# Patient Record
Sex: Female | Born: 1950
Health system: Southern US, Community
[De-identification: ages and names within clinical notes are randomized; demographics above are authoritative.]

## PROBLEM LIST (undated history)

## (undated) DIAGNOSIS — K219 Gastro-esophageal reflux disease without esophagitis: Secondary | ICD-10-CM

## (undated) DIAGNOSIS — I10 Essential (primary) hypertension: Secondary | ICD-10-CM

## (undated) DIAGNOSIS — R079 Chest pain, unspecified: Secondary | ICD-10-CM

## (undated) DIAGNOSIS — M1990 Unspecified osteoarthritis, unspecified site: Secondary | ICD-10-CM

## (undated) DIAGNOSIS — J4540 Moderate persistent asthma, uncomplicated: Principal | ICD-10-CM

## (undated) DIAGNOSIS — IMO0001 Reserved for inherently not codable concepts without codable children: Secondary | ICD-10-CM

## (undated) DIAGNOSIS — D649 Anemia, unspecified: Secondary | ICD-10-CM

## (undated) DIAGNOSIS — K529 Noninfective gastroenteritis and colitis, unspecified: Secondary | ICD-10-CM

## (undated) DIAGNOSIS — T783XXA Angioneurotic edema, initial encounter: Secondary | ICD-10-CM

## (undated) DIAGNOSIS — G4730 Sleep apnea, unspecified: Secondary | ICD-10-CM

## (undated) DIAGNOSIS — E7800 Pure hypercholesterolemia, unspecified: Secondary | ICD-10-CM

## (undated) HISTORY — DX: Moderate persistent asthma, uncomplicated: J45.40

## (undated) HISTORY — DX: Angioneurotic edema, initial encounter: T78.3XXA

## (undated) HISTORY — DX: Noninfective gastroenteritis and colitis, unspecified: K52.9

## (undated) HISTORY — PX: TONSILLECTOMY: SUR1361

---

## 1981-07-06 HISTORY — PX: TUBAL LIGATION: SHX77

## 1981-07-06 HISTORY — PX: ABDOMINAL HYSTERECTOMY: SHX81

## 1988-07-06 HISTORY — PX: INCONTINENCE SURGERY: SHX676

## 1998-07-06 DIAGNOSIS — K219 Gastro-esophageal reflux disease without esophagitis: Secondary | ICD-10-CM

## 1998-07-06 HISTORY — DX: Gastro-esophageal reflux disease without esophagitis: K21.9

## 2000-11-14 ENCOUNTER — Emergency Department (HOSPITAL_COMMUNITY): Admission: EM | Admit: 2000-11-14 | Discharge: 2000-11-14 | Payer: Self-pay | Admitting: Emergency Medicine

## 2000-11-14 ENCOUNTER — Encounter: Payer: Self-pay | Admitting: *Deleted

## 2001-03-07 ENCOUNTER — Emergency Department (HOSPITAL_COMMUNITY): Admission: EM | Admit: 2001-03-07 | Discharge: 2001-03-08 | Payer: Self-pay | Admitting: *Deleted

## 2001-03-08 ENCOUNTER — Encounter: Payer: Self-pay | Admitting: *Deleted

## 2001-07-21 ENCOUNTER — Encounter: Payer: Self-pay | Admitting: Unknown Physician Specialty

## 2001-07-21 ENCOUNTER — Ambulatory Visit (HOSPITAL_COMMUNITY): Admission: RE | Admit: 2001-07-21 | Discharge: 2001-07-21 | Payer: Self-pay | Admitting: Unknown Physician Specialty

## 2001-10-06 ENCOUNTER — Encounter: Payer: Self-pay | Admitting: Emergency Medicine

## 2001-10-06 ENCOUNTER — Emergency Department (HOSPITAL_COMMUNITY): Admission: EM | Admit: 2001-10-06 | Discharge: 2001-10-06 | Payer: Self-pay | Admitting: *Deleted

## 2001-10-17 ENCOUNTER — Encounter: Payer: Self-pay | Admitting: Internal Medicine

## 2001-10-17 ENCOUNTER — Ambulatory Visit (HOSPITAL_COMMUNITY): Admission: RE | Admit: 2001-10-17 | Discharge: 2001-10-17 | Payer: Self-pay | Admitting: Internal Medicine

## 2001-12-03 ENCOUNTER — Emergency Department (HOSPITAL_COMMUNITY): Admission: EM | Admit: 2001-12-03 | Discharge: 2001-12-03 | Payer: Self-pay | Admitting: Emergency Medicine

## 2003-08-20 ENCOUNTER — Ambulatory Visit (HOSPITAL_COMMUNITY): Admission: RE | Admit: 2003-08-20 | Discharge: 2003-08-20 | Payer: Self-pay | Admitting: Internal Medicine

## 2007-03-18 ENCOUNTER — Ambulatory Visit (HOSPITAL_COMMUNITY): Admission: RE | Admit: 2007-03-18 | Discharge: 2007-03-18 | Payer: Self-pay | Admitting: Family Medicine

## 2009-01-06 ENCOUNTER — Emergency Department (HOSPITAL_COMMUNITY): Admission: EM | Admit: 2009-01-06 | Discharge: 2009-01-06 | Payer: Self-pay | Admitting: Emergency Medicine

## 2009-07-06 DIAGNOSIS — E7800 Pure hypercholesterolemia, unspecified: Secondary | ICD-10-CM

## 2009-07-06 HISTORY — DX: Pure hypercholesterolemia, unspecified: E78.00

## 2010-08-02 ENCOUNTER — Emergency Department (HOSPITAL_COMMUNITY)
Admission: EM | Admit: 2010-08-02 | Discharge: 2010-08-02 | Payer: Self-pay | Source: Home / Self Care | Admitting: Emergency Medicine

## 2010-10-21 ENCOUNTER — Other Ambulatory Visit (HOSPITAL_COMMUNITY): Payer: Self-pay | Admitting: Family Medicine

## 2010-10-21 DIAGNOSIS — Z139 Encounter for screening, unspecified: Secondary | ICD-10-CM

## 2010-10-24 ENCOUNTER — Ambulatory Visit (HOSPITAL_COMMUNITY)
Admission: RE | Admit: 2010-10-24 | Discharge: 2010-10-24 | Disposition: A | Discharge status: Home / Self Care | Payer: PRIVATE HEALTH INSURANCE | Source: Ambulatory Visit | Attending: Family Medicine | Admitting: Family Medicine

## 2010-10-24 DIAGNOSIS — Z139 Encounter for screening, unspecified: Secondary | ICD-10-CM

## 2010-10-24 DIAGNOSIS — Z1231 Encounter for screening mammogram for malignant neoplasm of breast: Secondary | ICD-10-CM | POA: Insufficient documentation

## 2011-04-23 ENCOUNTER — Emergency Department (HOSPITAL_COMMUNITY): Payer: Self-pay

## 2011-04-23 ENCOUNTER — Other Ambulatory Visit: Payer: Self-pay

## 2011-04-23 ENCOUNTER — Inpatient Hospital Stay (HOSPITAL_COMMUNITY)
Admission: EM | Admit: 2011-04-23 | Discharge: 2011-04-25 | DRG: 392 | Disposition: A | Discharge status: Home / Self Care | Payer: Self-pay | Attending: Internal Medicine | Admitting: Internal Medicine

## 2011-04-23 ENCOUNTER — Encounter: Payer: Self-pay | Admitting: *Deleted

## 2011-04-23 DIAGNOSIS — K529 Noninfective gastroenteritis and colitis, unspecified: Secondary | ICD-10-CM | POA: Diagnosis present

## 2011-04-23 DIAGNOSIS — R7303 Prediabetes: Secondary | ICD-10-CM | POA: Diagnosis present

## 2011-04-23 DIAGNOSIS — E669 Obesity, unspecified: Secondary | ICD-10-CM | POA: Diagnosis present

## 2011-04-23 DIAGNOSIS — Z23 Encounter for immunization: Secondary | ICD-10-CM

## 2011-04-23 DIAGNOSIS — E119 Type 2 diabetes mellitus without complications: Secondary | ICD-10-CM | POA: Diagnosis present

## 2011-04-23 DIAGNOSIS — K5289 Other specified noninfective gastroenteritis and colitis: Principal | ICD-10-CM | POA: Diagnosis present

## 2011-04-23 DIAGNOSIS — I10 Essential (primary) hypertension: Secondary | ICD-10-CM | POA: Diagnosis present

## 2011-04-23 DIAGNOSIS — E7800 Pure hypercholesterolemia, unspecified: Secondary | ICD-10-CM | POA: Diagnosis present

## 2011-04-23 HISTORY — DX: Gastro-esophageal reflux disease without esophagitis: K21.9

## 2011-04-23 HISTORY — DX: Noninfective gastroenteritis and colitis, unspecified: K52.9

## 2011-04-23 HISTORY — DX: Essential (primary) hypertension: I10

## 2011-04-23 HISTORY — DX: Pure hypercholesterolemia, unspecified: E78.00

## 2011-04-23 LAB — COMPREHENSIVE METABOLIC PANEL
AST: 13 U/L (ref 0–37)
Albumin: 4 g/dL (ref 3.5–5.2)
BUN: 12 mg/dL (ref 6–23)
Calcium: 9.3 mg/dL (ref 8.4–10.5)
Creatinine, Ser: 0.89 mg/dL (ref 0.50–1.10)
Total Bilirubin: 0.4 mg/dL (ref 0.3–1.2)

## 2011-04-23 LAB — HEMOGLOBIN A1C: Hgb A1c MFr Bld: 6.2 % — ABNORMAL HIGH (ref <5.7)

## 2011-04-23 LAB — DIFFERENTIAL
Basophils Absolute: 0 10*3/uL (ref 0.0–0.1)
Basophils Relative: 0 % (ref 0–1)
Eosinophils Absolute: 0 10*3/uL (ref 0.0–0.7)
Eosinophils Relative: 0 % (ref 0–5)
Monocytes Absolute: 0.4 10*3/uL (ref 0.1–1.0)
Monocytes Relative: 3 % (ref 3–12)

## 2011-04-23 LAB — CBC
HCT: 39 % (ref 36.0–46.0)
Hemoglobin: 12.8 g/dL (ref 12.0–15.0)
MCH: 26.9 pg (ref 26.0–34.0)
MCHC: 32.8 g/dL (ref 30.0–36.0)
MCV: 82.1 fL (ref 78.0–100.0)
RDW: 14.1 % (ref 11.5–15.5)

## 2011-04-23 LAB — GLUCOSE, CAPILLARY: Glucose-Capillary: 79 mg/dL (ref 70–99)

## 2011-04-23 LAB — LIPASE, BLOOD: Lipase: 15 U/L (ref 11–59)

## 2011-04-23 MED ORDER — SIMVASTATIN 10 MG PO TABS
10.0000 mg | ORAL_TABLET | Freq: Every day | ORAL | Status: DC
  Administered 2011-04-23 – 2011-04-24 (×2): 10 mg via ORAL
  Filled 2011-04-23 (×2): qty 1

## 2011-04-23 MED ORDER — HYDROCHLOROTHIAZIDE 25 MG PO TABS
12.5000 mg | ORAL_TABLET | Freq: Every day | ORAL | Status: DC
  Administered 2011-04-23 – 2011-04-25 (×3): 12.5 mg via ORAL
  Filled 2011-04-23 (×3): qty 1

## 2011-04-23 MED ORDER — LISINOPRIL-HYDROCHLOROTHIAZIDE 20-12.5 MG PO TABS: 1.0000 | ORAL_TABLET | Freq: Every day | ORAL | Status: DC

## 2011-04-23 MED ORDER — ACETAMINOPHEN 325 MG PO TABS
650.0000 mg | ORAL_TABLET | Freq: Four times a day (QID) | ORAL | Status: DC | PRN
  Administered 2011-04-24 – 2011-04-25 (×2): 650 mg via ORAL
  Filled 2011-04-23 (×2): qty 2

## 2011-04-23 MED ORDER — CIPROFLOXACIN IN D5W 400 MG/200ML IV SOLN
400.0000 mg | Freq: Two times a day (BID) | INTRAVENOUS | Status: DC
  Administered 2011-04-23 – 2011-04-24 (×3): 400 mg via INTRAVENOUS
  Filled 2011-04-23 (×10): qty 200

## 2011-04-23 MED ORDER — IOHEXOL 300 MG/ML  SOLN
100.0000 mL | Freq: Once | INTRAMUSCULAR | Status: AC | PRN
  Administered 2011-04-23: 100 mL via INTRAVENOUS

## 2011-04-23 MED ORDER — INFLUENZA VIRUS VACC SPLIT PF IM SUSP
0.5000 mL | Freq: Once | INTRAMUSCULAR | Status: AC
  Administered 2011-04-23: 0.5 mL via INTRAMUSCULAR
  Filled 2011-04-23: qty 0.5

## 2011-04-23 MED ORDER — POTASSIUM CHLORIDE CRYS ER 20 MEQ PO TBCR
40.0000 meq | EXTENDED_RELEASE_TABLET | Freq: Two times a day (BID) | ORAL | Status: AC
  Administered 2011-04-23 (×2): 40 meq via ORAL
  Filled 2011-04-23 (×2): qty 2

## 2011-04-23 MED ORDER — ONDANSETRON HCL 4 MG PO TABS: 4.0000 mg | ORAL_TABLET | Freq: Four times a day (QID) | ORAL | Status: DC | PRN

## 2011-04-23 MED ORDER — ONDANSETRON HCL 4 MG/2ML IJ SOLN
4.0000 mg | Freq: Once | INTRAMUSCULAR | Status: AC
  Administered 2011-04-23: 4 mg via INTRAVENOUS
  Filled 2011-04-23: qty 2

## 2011-04-23 MED ORDER — POTASSIUM CHLORIDE 20 MEQ PO PACK: 40.0000 meq | PACK | Freq: Once | ORAL | Status: DC

## 2011-04-23 MED ORDER — MORPHINE SULFATE 2 MG/ML IJ SOLN
2.0000 mg | INTRAMUSCULAR | Status: DC | PRN
  Administered 2011-04-23 – 2011-04-25 (×3): 2 mg via INTRAVENOUS
  Filled 2011-04-23 (×3): qty 1

## 2011-04-23 MED ORDER — HYDROMORPHONE HCL 1 MG/ML IJ SOLN
0.5000 mg | Freq: Once | INTRAMUSCULAR | Status: AC
  Administered 2011-04-23: 0.5 mg via INTRAVENOUS
  Filled 2011-04-23: qty 1

## 2011-04-23 MED ORDER — MORPHINE SULFATE 4 MG/ML IJ SOLN
4.0000 mg | Freq: Once | INTRAMUSCULAR | Status: AC
  Administered 2011-04-23: 4 mg via INTRAVENOUS
  Filled 2011-04-23: qty 1

## 2011-04-23 MED ORDER — ENOXAPARIN SODIUM 40 MG/0.4ML ~~LOC~~ SOLN
40.0000 mg | Freq: Every day | SUBCUTANEOUS | Status: DC
  Administered 2011-04-23 – 2011-04-25 (×3): 40 mg via SUBCUTANEOUS
  Filled 2011-04-23 (×3): qty 0.4

## 2011-04-23 MED ORDER — PNEUMOCOCCAL VAC POLYVALENT 25 MCG/0.5ML IJ INJ
0.5000 mL | INJECTION | INTRAMUSCULAR | Status: AC
  Administered 2011-04-24: 0.5 mL via INTRAMUSCULAR
  Filled 2011-04-23: qty 0.5

## 2011-04-23 MED ORDER — SODIUM CHLORIDE 0.9 % IV BOLUS (SEPSIS)
500.0000 mL | Freq: Once | INTRAVENOUS | Status: AC
  Administered 2011-04-23: 500 mL via INTRAVENOUS

## 2011-04-23 MED ORDER — PROMETHAZINE HCL 25 MG/ML IJ SOLN
12.5000 mg | Freq: Four times a day (QID) | INTRAMUSCULAR | Status: DC | PRN
  Administered 2011-04-23 (×2): 12.5 mg via INTRAVENOUS
  Filled 2011-04-23 (×2): qty 1

## 2011-04-23 MED ORDER — ACETAMINOPHEN 650 MG RE SUPP: 650.0000 mg | Freq: Four times a day (QID) | RECTAL | Status: DC | PRN

## 2011-04-23 MED ORDER — LISINOPRIL 10 MG PO TABS
20.0000 mg | ORAL_TABLET | Freq: Every day | ORAL | Status: DC
  Administered 2011-04-23 – 2011-04-25 (×3): 20 mg via ORAL
  Filled 2011-04-23 (×3): qty 2

## 2011-04-23 MED ORDER — TRAZODONE HCL 50 MG PO TABS: 25.0000 mg | ORAL_TABLET | Freq: Every evening | ORAL | Status: DC | PRN

## 2011-04-23 MED ORDER — ONDANSETRON HCL 4 MG/2ML IJ SOLN: 4.0000 mg | Freq: Four times a day (QID) | INTRAMUSCULAR | Status: DC | PRN

## 2011-04-23 MED ORDER — INSULIN ASPART 100 UNIT/ML ~~LOC~~ SOLN: 0.0000 [IU] | Freq: Three times a day (TID) | SUBCUTANEOUS | Status: DC

## 2011-04-23 MED ORDER — SODIUM CHLORIDE 0.9 % IV BOLUS (SEPSIS)
1000.0000 mL | Freq: Once | INTRAVENOUS | Status: AC
  Administered 2011-04-23: 1000 mL via INTRAVENOUS

## 2011-04-23 MED ORDER — CIPROFLOXACIN HCL 250 MG PO TABS
500.0000 mg | ORAL_TABLET | Freq: Once | ORAL | Status: AC
  Administered 2011-04-23: 500 mg via ORAL
  Filled 2011-04-23: qty 2

## 2011-04-23 MED ORDER — BIOTENE DRY MOUTH MT LIQD
Freq: Two times a day (BID) | OROMUCOSAL | Status: DC
  Administered 2011-04-23 (×2): 1 via OROMUCOSAL
  Administered 2011-04-24 – 2011-04-25 (×3): via OROMUCOSAL

## 2011-04-23 MED ORDER — METRONIDAZOLE IN NACL 5-0.79 MG/ML-% IV SOLN
500.0000 mg | Freq: Three times a day (TID) | INTRAVENOUS | Status: DC
  Administered 2011-04-23 – 2011-04-24 (×3): 500 mg via INTRAVENOUS
  Filled 2011-04-23 (×13): qty 100

## 2011-04-23 MED ORDER — HYDROMORPHONE HCL 1 MG/ML IJ SOLN
1.0000 mg | Freq: Once | INTRAMUSCULAR | Status: AC
  Administered 2011-04-23: 1 mg via INTRAVENOUS
  Filled 2011-04-23: qty 1

## 2011-04-23 MED ORDER — METRONIDAZOLE IN NACL 5-0.79 MG/ML-% IV SOLN
500.0000 mg | Freq: Once | INTRAVENOUS | Status: AC
  Administered 2011-04-23: 500 mg via INTRAVENOUS
  Filled 2011-04-23: qty 100

## 2011-04-23 MED ORDER — ALUM & MAG HYDROXIDE-SIMETH 200-200-20 MG/5ML PO SUSP: 30.0000 mL | Freq: Four times a day (QID) | ORAL | Status: DC | PRN

## 2011-04-23 MED ORDER — POTASSIUM CHLORIDE CRYS ER 20 MEQ PO TBCR
EXTENDED_RELEASE_TABLET | ORAL | Status: AC
  Administered 2011-04-23: 40 meq via ORAL
  Filled 2011-04-23: qty 2

## 2011-04-23 MED ORDER — SODIUM CHLORIDE 0.9 % IV SOLN
INTRAVENOUS | Status: DC
  Administered 2011-04-23: 1000 mL via INTRAVENOUS
  Administered 2011-04-23: 950 mL via INTRAVENOUS
  Administered 2011-04-24 (×2): via INTRAVENOUS

## 2011-04-23 MED ORDER — POTASSIUM CHLORIDE CRYS ER 20 MEQ PO TBCR
40.0000 meq | EXTENDED_RELEASE_TABLET | Freq: Once | ORAL | Status: AC
  Administered 2011-04-23: 40 meq via ORAL

## 2011-04-23 NOTE — ED Notes (Signed)
Pt reports generalized abd pain with n/v/d starting 5 hs ago

## 2011-04-23 NOTE — ED Notes (Signed)
Pt vomiting at this time. Dr. Bebe Shaggy made aware.

## 2011-04-23 NOTE — ED Provider Notes (Signed)
History     CSN: [id number] Arrival date & time: 04/23/2011  1:06 AM      Chief Complaint  Patient presents with  . Abdominal Pain    (Consider location/radiation/quality/duration/timing/severity/associated sxs/prior treatment) Patient is a 60 y.o. female presenting with abdominal pain. The history is provided by the patient.  Abdominal Pain The primary symptoms of the illness include abdominal pain, nausea, vomiting and diarrhea. The primary symptoms of the illness do not include fever, hematemesis, hematochezia or dysuria. The current episode started 3 to 5 hours ago. The onset of the illness was sudden. The problem has been gradually worsening.  no sick contacts No recent travel Has had this abd pain before but this is worse   Past Medical History  Diagnosis Date  . Hypertension   . Diabetes mellitus   . Hypercholesteremia   . Acid reflux     Past Surgical History  Procedure Date  . Abdominal hysterectomy   . Tubal ligation     No family history on file.  History  Substance Use Topics  . Smoking status: Not on file  . Smokeless tobacco: Not on file  . Alcohol Use:     OB History    Grav Para Term Preterm Abortions TAB SAB Ect Mult Living                  Review of Systems  Constitutional: Negative for fever.  Gastrointestinal: Positive for nausea, vomiting, abdominal pain and diarrhea. Negative for hematochezia and hematemesis.  Genitourinary: Negative for dysuria.  All other systems reviewed and are negative.    Allergies  Penicillins  Home Medications   Current Outpatient Rx  Name Route Sig Dispense Refill  . LISINOPRIL-HYDROCHLOROTHIAZIDE 20-12.5 MG PO TABS Oral Take 1 tablet by mouth daily.      Marland Kitchen PRAVASTATIN SODIUM 20 MG PO TABS Oral Take 20 mg by mouth daily.        BP 133/63  Pulse 57  Temp(Src) 98.7 F (37.1 C) (Oral)  Resp 12  Ht 5\' 3"  (1.6 m)  Wt 232 lb (105.235 kg)  BMI 41.10 kg/m2  SpO2 99%  Physical  Exam  CONSTITUTIONAL: Well developed/well nourished HEAD AND FACE: Normocephalic/atraumatic EYES: EOMI/PERRL, no scleral icterus ENMT: Mucous membranes moist NECK: supple no meningeal signs SPINE:entire spine nontender CV: S1/S2 noted, no murmurs/rubs/gallops noted LUNGS: Lungs are clear to auscultation bilaterally, no apparent distress ABDOMEN: soft, LUQ tenderness noted, tenderness moderate, no rebound/guarding GU:no cva tenderness NEURO: Pt is awake/alert, moves all extremitiesx4 EXTREMITIES: pulses normal, full ROM SKIN: warm, color normal PSYCH: no abnormalities of mood noted    ED Course  Procedures (including critical care time)  Labs Reviewed  CBC - Abnormal; Notable for the following:    WBC 11.8 (*)    All other components within normal limits  DIFFERENTIAL - Abnormal; Notable for the following:    Neutrophils Relative 83 (*)    Neutro Abs 9.8 (*)    All other components within normal limits  COMPREHENSIVE METABOLIC PANEL  LIPASE, BLOOD      MDM  Nursing notes reviewed and considered in documentation All labs/vitals reviewed and considered    Date: 04/23/2011  Rate: 83  Rhythm: normal sinus rhythm  QRS Axis: left  Intervals: normal  ST/T Wave abnormalities: normal  Conduction Disutrbances:none  Narrative Interpretation:   Old EKG Reviewed: none available  2:15 AM Will treat pain/nausea and reassess   2:49 AM Pt still with abd pain after pain meds, will get  ct imaging BP 133/63  Pulse 57  Temp(Src) 98.7 F (37.1 C) (Oral)  Resp 12  Ht 5\' 3"  (1.6 m)  Wt 232 lb (105.235 kg)  BMI 41.10 kg/m2  SpO2 99%    5:11 AM I discussed results with patient, she still has pain Also has immediate vomiting with dilaudid, will switch to morphine  6:04 AM Pt with some improvement abd is soft without focal tenderness Given symptoms/ct findings, will load with abx and reassess  6:40 AM Pt with more vomiting, will admit to hospital, she goes to the  free clinic as her provider D/w dr Arthor Captain, will admit  Joya Gaskins, MD 04/23/11 [phone]

## 2011-04-23 NOTE — ED Notes (Signed)
Pt called out and said " i can't drink the contrast. I am throwing it up." Dr. Bebe Shaggy made aware and Radiology called as well.

## 2011-04-23 NOTE — ED Notes (Signed)
Patient left ED via Stretcher with ED tech in good condition. NAD noted upon departure.

## 2011-04-23 NOTE — H&P (Signed)
PCP:   [facility]   Chief Complaint:  Abdominal pain   HPI:  Sandra [name] is a 60 year old African American female and does have diabetes mellitus type 2 diet-controlled hypertension and hyperlipidemia. In hospital complaining of abdominal pain. Spouse said her she was in her usual state of health tell last night about 7 PM when she started to have abdominal pain is epigastric started as 7/10 and then increase to 10 out of 10 by midnight. Pain is constant is not relieved by over-the-counter pain medications. Patient also does have nausea and vomiting. She vomited numerous times since the pain started. Patient counted 13 times. She noticed as food particles there is no blood the patient also has some diarrhea. She has 3 times loose bowel movements with no blood upon initial evaluation in the emergency department patient evaluated by CT scan which showed enteritis patient cannot keep anything down currently so patient admitted for further evaluation  Past Medical History:   Diagnosis Date  . Hypertension   . Diabetes mellitus   . Hypercholesteremia   . Acid reflux    Past Surgical History  Procedure Date  . Abdominal hysterectomy   . Tubal ligation     Medications: Prior to Admission medications   Medication Sig Start Date End Date Taking? Authorizing Provider  lisinopril-hydrochlorothiazide (PRINZIDE,ZESTORETIC) 20-12.5 MG per tablet Take 1 tablet by mouth daily.     Yes Historical Provider, MD  pravastatin (PRAVACHOL) 20 MG tablet Take 20 mg by mouth daily.     Yes Historical Provider, MD    Allergies:   Allergies  Allergen Reactions  . Penicillins     Social History: No smoking or alcohol  Family History: Diabetes mellitus type 2 and hypertension in her mother  Review of Systems:  The patient denies anorexia, fever, weight loss,, vision loss, decreased hearing, hoarseness, chest pain, syncope, dyspnea on exertion, peripheral edema, balance deficits, hemoptysis,  abdominal pain, melena, hematochezia, severe indigestion/heartburn, hematuria, incontinence, genital sores, muscle weakness, suspicious skin lesions, transient blindness, difficulty walking, depression, unusual weight change, abnormal bleeding, enlarged lymph nodes, angioedema, and breast masses.  Physical Exam: Filed Vitals:   04/23/11 0518 04/23/11 0527 04/23/11 0708 04/23/11 0819  BP: 164/80 164/80 165/89 174/92  Pulse: 65  70 55  Temp:    97.4 F (36.3 C)  TempSrc:    Oral  Resp: 18 14 20 18   Height:      Weight:      SpO2: 97% 99% 100% 97%     GEN: person lying in the stretcher in no acute distress; cooperative with exam  PSYCH: He is alert and oriented x4; does not appear anxious does not appear depressed; affect is normal  HEENT: Mucous membranes pink and anicteric; PERRLA; EOM intact; no cervical lymphadenopathy nor thyromegaly or carotid bruit; no JVD;  Breasts:: Not examined  CHEST WALL: No tenderness  CHEST: Normal respiration, clear to auscultation bilaterally  HEART: Regular rate and rhythm; no murmurs rubs or gallops  BACK: No kyphosis or scoliosis; no CVA tenderness  ABDOMEN: Obese, soft minimal tenderness; no masses, no organomegaly, normal abdominal bowel sounds; no pannus; no intertriginous candida.  Rectal Exam: Not done  EXTREMITIES: No bone or joint deformity; age-appropriate arthropathy of the hands and knees; no edema; no ulcerations.  Genitalia: not examined  PULSES: 2+ and symmetric  SKIN: Normal hydration no rash or ulceration  CNS: Cranial nerves 2-12 grossly intact no focal neurologic deficit  Labs on Admission:   [facility] 04/23/11 0123  NA 141  K 3.1*  CL 97  CO2 34*  GLUCOSE 145*  BUN 12  CREATININE 0.89  CALCIUM 9.3  MG --  PHOS --    Basename 04/23/11 0123  AST 13  ALT 9  ALKPHOS 107  BILITOT 0.4  PROT 8.2  ALBUMIN 4.0    Basename 04/23/11 0123  LIPASE 15  AMYLASE --    Basename 04/23/11 0123  WBC 11.8*  NEUTROABS 9.8*    HGB 12.8  HCT 39.0  MCV 82.1  PLT 360   No results found for this basename: CKTOTAL:3,CKMB:3,CKMBINDEX:3,TROPONINI:3 in the last 72 hours No results found for this basename: TSH,T4TOTAL,FREET3,T3FREE,THYROIDAB in the last 72 hours No results found for this basename: VITAMINB12:2,FOLATE:2,FERRITIN:2,TIBC:2,IRON:2,RETICCTPCT:2 in the last 72 hours  Radiological Exams on Admission: Ct Abdomen Pelvis W Contrast  04/23/2011  *RADIOLOGY REPORT*  Clinical Data: Abdominal pain, nausea, vomiting, and diarrhea for 5 hours.  CT ABDOMEN AND PELVIS WITH CONTRAST  Technique:  Multidetector CT imaging of the abdomen and pelvis was performed following the standard protocol during bolus administration of intravenous contrast.  Contrast: OMNIPAQUE IOHEXOL 300 MG/ML IV SOLN  Comparison: None.  Findings: Atelectasis or focal infiltration in the lung bases. Contrast material in the lower esophagus suggesting reflux or dysmotility.  The liver, spleen, gallbladder, pancreas, adrenal glands, stomach, abdominal aorta, and retroperitoneal lymph nodes are unremarkable.  Bilateral renal cysts, the largest in the left mid pole measuring 5.1 cm diameter.  No hydronephrosis in either kidney.  There is suggestion of diffuse edema in the lower abdominal small bowel loops causing bowel wall thickening.  There is mesenteric infiltration and fluid in the pelvis.  Fluid density is consistent with ascites.  Changes likely to represent enteritis or inflammatory bowel disease although early ischemia is not entirely excluded.  Flow is demonstrated in the superior mesenteric arteries and there is no pneumatosis or portal venous gas to provide any direct evidence of ischemia.  No bowel dilatation.  Pelvis:  In addition to the pelvic ascites, there is no evidence of loculated fluid.  Focal nodularity at the dome of the bladder with fibrosis leading to the emboli this suggesting urachal remnant.  No bladder wall thickening.  The colon is  decompressed.  No inflammatory changes involving the sigmoid colon.  The appendix is not visualized. Degenerative disc disease at the lumbosacral interspace.  IMPRESSION: Diffuse thickening and edema in the wall of the distal small bowel without obstruction and with mesenteric edema and pelvic ascites. Changes likely to represent enteritis or inflammatory bowel disease.  Ischemic bowel is less likely.  Original Report Authenticated By: Marlon Pel, M.D.    Assessment/Plan Present on Admission:  Enteritis The patient admitted to the hospital. Stool culture will be done as well as PCR for C. Difficile.area and patient will be empirically on ciprofloxacin and Flagyl IV. Also symptomatic management for her nausea and vomiting will be instituted by Zofran and Phenergan. IV fluids also be in his will be started   .DM type 2 (diabetes mellitus, type 2) Diabetes is diet-controlled. Patient will be as on sliding scale and hemoglobin A1c will be obtained   .HTN (hypertension) We will restart home medications which is lisinopril 20 mg and hydrochlorothiazide at 12.5 mg.   .Hypercholesteremia This is seems to be controlled patient on pravastatin on medication will be continued in the pharmacy alternative will be accepted  .Obesity This is stable patient in counseled.  Marland Kitchen  Arash Karstens A 04/23/2011, 8:31 AM

## 2011-04-23 NOTE — ED Notes (Signed)
Report given to [place], RN on unit 300. Ready to receive patient.

## 2011-04-23 NOTE — ED Notes (Signed)
Pt medicated with nausea and pain medication. After pain medication administration pt began to vomit. Dr. Bebe Shaggy made aware.

## 2011-04-24 LAB — COMPREHENSIVE METABOLIC PANEL
ALT: 7 U/L (ref 0–35)
Calcium: 8.6 mg/dL (ref 8.4–10.5)
GFR calc Af Amer: 72 mL/min — ABNORMAL LOW (ref >90)
Glucose, Bld: 112 mg/dL — ABNORMAL HIGH (ref 70–99)
Sodium: 140 mEq/L (ref 135–145)
Total Protein: 5.9 g/dL — ABNORMAL LOW (ref 6.0–8.3)

## 2011-04-24 LAB — CBC
HCT: 31.7 % — ABNORMAL LOW (ref 36.0–46.0)
Hemoglobin: 10.3 g/dL — ABNORMAL LOW (ref 12.0–15.0)
MCHC: 32.5 g/dL (ref 30.0–36.0)
WBC: 6.5 10*3/uL (ref 4.0–10.5)

## 2011-04-24 LAB — GLUCOSE, CAPILLARY
Glucose-Capillary: 100 mg/dL — ABNORMAL HIGH (ref 70–99)
Glucose-Capillary: 118 mg/dL — ABNORMAL HIGH (ref 70–99)
Glucose-Capillary: 118 mg/dL — ABNORMAL HIGH (ref 70–99)

## 2011-04-24 MED ORDER — POTASSIUM CHLORIDE CRYS ER 20 MEQ PO TBCR
40.0000 meq | EXTENDED_RELEASE_TABLET | Freq: Once | ORAL | Status: AC
  Administered 2011-04-24: 40 meq via ORAL
  Filled 2011-04-24: qty 2

## 2011-04-24 MED ORDER — CIPROFLOXACIN HCL 250 MG PO TABS
500.0000 mg | ORAL_TABLET | Freq: Two times a day (BID) | ORAL | Status: DC
  Administered 2011-04-24 – 2011-04-25 (×3): 500 mg via ORAL
  Filled 2011-04-24 (×3): qty 2

## 2011-04-24 MED ORDER — METRONIDAZOLE 500 MG PO TABS
500.0000 mg | ORAL_TABLET | Freq: Three times a day (TID) | ORAL | Status: DC
  Administered 2011-04-24 – 2011-04-25 (×3): 500 mg via ORAL
  Filled 2011-04-24 (×3): qty 1

## 2011-04-24 NOTE — Progress Notes (Signed)
Subjective: Nausea and vomiting improved. Patient had her breakfast this morning without any problems. She feels she is much better than yesterday. No bowel movements since admission  Objective: Weight change:   Intake/Output Summary (Last 24 hours) at 04/24/11 1155 Last data filed at 04/24/11 0900  Gross per 24 hour  Intake    580 ml  Output      0 ml  Net    580 ml   Temp:  [97.9 F (36.6 C)-98 F (36.7 C)] 97.9 F (36.6 C) (10/19 0532) Pulse Rate:  [67-74] 67  (10/19 0532) Resp:  [18] 18  (10/19 0532) BP: (103-182)/(65-98) 103/65 mmHg (10/19 0532) SpO2:  [96 %-98 %] 98 % (10/19 0532) Physical Exam: General: Alert and awake oriented x3 not in any acute distress. HEENT: anicteric sclera, pupils equal reactive to light and accommodation CVS: S1-S2 heard, no murmur rubs or gallops Chest: clear to auscultation bilaterally, no wheezing rales or rhonchi Abdomen:  normal bowel sounds, soft, nontender, nondistended, no organomegaly Neuro: Cranial nerves II-XII intact, no focal neurological deficits Extremities: no cyanosis, no clubbing or edema noted bilaterally   Lab Results:  [facility] 04/24/11 0442 04/23/11 0123  NA 140 141  K 3.6 3.1*  CL 104 97  CO2 31 34*  GLUCOSE 112* 145*  BUN 9 12  CREATININE 0.97 0.89  CALCIUM 8.6 9.3  MG -- --  PHOS -- --    Basename 04/24/11 0442 04/23/11 0123  AST 11 13  ALT 7 9  ALKPHOS 82 107  BILITOT 0.3 0.4  PROT 5.9* 8.2  ALBUMIN 2.7* 4.0    Basename 04/23/11 0123  LIPASE 15  AMYLASE --    Basename 04/24/11 0442 04/23/11 0123  WBC 6.5 11.8*  NEUTROABS -- 9.8*  HGB 10.3* 12.8  HCT 31.7* 39.0  MCV 84.8 82.1  PLT 271 360    Basename 04/23/11 0123  HGBA1C 6.2*   Micro Results:   Studies/Results: Ct Abdomen Pelvis W Contrast 04/23/2011     IMPRESSION: Diffuse thickening and edema in the wall of the distal small bowel without obstruction and with mesenteric edema and pelvic ascites. Changes likely to represent  enteritis or inflammatory bowel disease.  Ischemic bowel is less likely.   Medications: Scheduled Meds:   . antiseptic oral rinse   Mouth Rinse BID  . ciprofloxacin  400 mg Intravenous Q12H  . enoxaparin  40 mg Subcutaneous Daily  . hydrochlorothiazide  12.5 mg Oral Daily  . influenza  inactive virus vaccine  0.5 mL Intramuscular Once  . insulin aspart  0-15 Units Subcutaneous TID WC  . lisinopril  20 mg Oral Daily  . metronidazole  500 mg Intravenous Q8H  . pneumococcal 23 valent vaccine  0.5 mL Intramuscular Tomorrow-1000  . potassium chloride  40 mEq Oral BID  . simvastatin  10 mg Oral q1800   Continuous Infusions:   . sodium chloride 100 mL/hr at 04/24/11 1104   PRN Meds:.acetaminophen, acetaminophen, alum & mag hydroxide-simeth, morphine, ondansetron (ZOFRAN) IV, ondansetron, promethazine, traZODone  Assessment/Plan:  Enteritis Enteritis per CT scan of abdomen and pelvis. Patient started empirically on ciprofloxacin and Flagyl. Patient nausea and vomiting improved. She is tolerating solid patent. We'll continue IV fluids for aggressive hydration. We'll I will switch the antibiotics to oral equivalent. Patient lipase is negative. There is no other acute findings this in the CT scan apart from the enteritis. My guess this is likely to be secondary to bacterial versus transient viral ileitis. I think inflammatory bowel disease  as it is very unlikely and the patient and her age  DM type 2 (diabetes mellitus, type 2) Patient hemoglobin A1c is 6.2. Test nondiagnostic for diabetes mellitus type 2. Even her fasting blood sugar this morning is 112. I suspect the patient has glucose intolerance rather than full-blown diabetes mellitus. Patient instructed to continue on the diabetic diet he'll not start any hypoglycemic agent.  HTN (hypertension) Hypertension seems controlled blood pressure medications from home was started.  Hypercholesteremia Her medications was continued. Because of  the pharmacy does not carry a fast acting that was switched to Crestor 20 mg during the hospital stay.  Obesity Patient counseled about her weight.    LOS: 1 day   Jarquez Mestre A 04/24/2011, 11:55 AM

## 2011-04-25 LAB — BASIC METABOLIC PANEL
Calcium: 8.6 mg/dL (ref 8.4–10.5)
Creatinine, Ser: 1.05 mg/dL (ref 0.50–1.10)
GFR calc Af Amer: 65 mL/min — ABNORMAL LOW (ref >90)

## 2011-04-25 MED ORDER — CIPROFLOXACIN HCL 500 MG PO TABS: 500.0000 mg | ORAL_TABLET | Freq: Two times a day (BID) | ORAL | Status: AC

## 2011-04-25 NOTE — Discharge Summary (Signed)
DISCHARGE SUMMARY  Sandra [name]  MR#: [id number]  DOB:December 30, 1950  Date of Admission: 04/23/2011 Date of Discharge: 04/25/2011  Attending Physician:Margretta Zamorano A  Patient's PCP: [facility] Department clinic  Consults: None  Discharge Diagnoses:  .Enteritis .DM type 2 (diabetes mellitus, type 2) .HTN (hypertension) .Hypercholesteremia .Obesity     Current Discharge Medication List    START taking these medications   Details  ciprofloxacin (CIPRO) 500 MG tablet Take 1 tablet (500 mg total) by mouth 2 (two) times daily. Qty: 14 tablet, Refills: 0      CONTINUE these medications which have NOT CHANGED   Details  lisinopril-hydrochlorothiazide (PRINZIDE,ZESTORETIC) 20-12.5 MG per tablet Take 1 tablet by mouth daily.      pravastatin (PRAVACHOL) 20 MG tablet Take 20 mg by mouth daily.         Brief History   Sandra [name] is a 60 year old African American female and does have diabetes mellitus type 2 diet-controlled hypertension and hyperlipidemia. In hospital complaining of abdominal pain. Spouse said her she was in her usual state of health tell last night about 7 PM when she started to have abdominal pain is epigastric started as 7/10 and then increase to 10 out of 10 by midnight. Pain is constant is not relieved by over-the-counter pain medications. Patient also does have nausea and vomiting. She vomited numerous times since the pain started. Patient counted 13 times. She noticed as food particles there is no blood the patient also has some diarrhea. She has 3 times loose bowel movements with no blood upon initial evaluation in the emergency department patient evaluated by CT scan which showed enteritis patient cannot keep anything down currently so patient admitted for further evaluation  Hospital Course:  . Enteritis Nausea vomiting likely secondary to enteritis. The enteritis according to the CT scan is likely affect was. I less likely to be  inflammatory or ischemic. Patient admitted to the hospital started on clear liquids and advance as tolerated. She was treated symptomatically with anti-medics and narcotics for pain control. Patient started empirically on ciprofloxacin and Flagyl. Her symptoms resolved and she started to eat. Patient denied nausea vomiting or abdominal pain. Has some diarrhea the day of admission so stool studies as well as a stool PCR for C. difficile was sent. The patient while in the hospital did not have a bowel movement until the day of discharge. Patient did not have any risk factors for C. difficile. So she was stopped on discharge is Cipro for a 1 week. Patient to follow with her primary care physician.  . DM type 2 (diabetes mellitus, type 2) Patient said she was diagnosed with type 2 diabetes mellitus. The patient hemoglobin A1c while she is in the hospital here was 6.2 and her fasting blood glucose was never more than 118. I don't think full-blown diabetes she might have some type of glucose intolerance. But she is instructed to take not to continue on the carbohydrate modified diet as well as to try to lose weight.   Marland Kitchen HTN (hypertension) This is being controlled throughout the hospital stay at admission her antihypertensive medications were restarted patient was normotensive throughout the hospital stay.  Marland Kitchen Hypercholesteremia Stable   . Obesity   Day of Discharge BP 138/81  Pulse 61  Temp(Src) 98.1 F (36.7 C) (Oral)  Resp 18  Ht 5\' 3"  (1.6 m)  Wt 105.235 kg (232 lb)  BMI 41.10 kg/m2  SpO2 100%  Physical Exam: GEN: person lying in the stretcher in  no acute distress; cooperative with exam  PSYCH: He is alert and oriented x4; does not appear anxious does not appear depressed; affect is normal  HEENT: Mucous membranes pink and anicteric; PERRLA; EOM intact; no cervical lymphadenopathy nor thyromegaly or carotid bruit; no JVD;  CHEST WALL: No tenderness  CHEST: Normal respiration, clear to  auscultation bilaterally  HEART: Regular rate and rhythm; no murmurs rubs or gallops  BACK: No kyphosis or scoliosis; no CVA tenderness  ABDOMEN: Obese, soft minimal tenderness; no masses, no organomegaly, normal abdominal bowel sounds; has a pannus; no intertriginous candida.  EXTREMITIES: No bone or joint deformity; age-appropriate arthropathy of the hands and knees; no edema; no ulcerations.  PULSES: 2+ and symmetric  SKIN: Normal hydration no rash or ulceration  CNS: Cranial nerves 2-12 grossly intact no focal neurologic deficit     Results for orders placed during the hospital encounter of 04/23/11 (from the past 24 hour(s))  GLUCOSE, CAPILLARY     Status: Abnormal   Collection Time   04/24/11  4:20 PM      Component Value Range   Glucose-Capillary 118 (*) 70 - 99 (mg/dL)  GLUCOSE, CAPILLARY     Status: Abnormal   Collection Time   04/24/11  9:22 PM      Component Value Range   Glucose-Capillary 118 (*) 70 - 99 (mg/dL)  BASIC METABOLIC PANEL     Status: Abnormal   Collection Time   04/25/11  5:16 AM      Component Value Range   Sodium 140  135 - 145 (mEq/L)   Potassium 4.1  3.5 - 5.1 (mEq/L)   Chloride 105  96 - 112 (mEq/L)   CO2 31  19 - 32 (mEq/L)   Glucose, Bld 107 (*) 70 - 99 (mg/dL)   BUN 8  6 - 23 (mg/dL)   Creatinine, Ser 1.61  0.50 - 1.10 (mg/dL)   Calcium 8.6  8.4 - 09.6 (mg/dL)   GFR calc non Af Amer 56 (*) >90 (mL/min)   GFR calc Af Amer 65 (*) >90 (mL/min)  GLUCOSE, CAPILLARY     Status: Normal   Collection Time   04/25/11  8:03 AM      Component Value Range   Glucose-Capillary 95  70 - 99 (mg/dL)  CLOSTRIDIUM DIFFICILE BY PCR     Status: Normal   Collection Time   04/25/11 10:45 AM      Component Value Range   C difficile by pcr NEGATIVE  NEGATIVE   GLUCOSE, CAPILLARY     Status: Normal   Collection Time   04/25/11 12:04 PM      Component Value Range   Glucose-Capillary 92  70 - 99 (mg/dL)    Disposition: Home   Follow-up Appts: Discharge  Orders    Future Orders Please Complete By Expires   Diet Carb Modified      Increase activity slowly         Signed: Kristi Hyer A 04/25/2011, 12:52 PM

## 2011-04-25 NOTE — Progress Notes (Signed)
Discharge Summary: a/o.vss. Saline lock removed. No complaints of abdominal pain or n/v. Up ad lib. Discharge instructions given. Prescriptions given. Pt verbalized understanding of instructions. Pt awaiting for family member to arrive for discharge.

## 2011-04-28 LAB — STOOL CULTURE

## 2012-06-23 ENCOUNTER — Other Ambulatory Visit (HOSPITAL_COMMUNITY): Payer: Self-pay | Admitting: Nurse Practitioner

## 2012-06-23 DIAGNOSIS — Z139 Encounter for screening, unspecified: Secondary | ICD-10-CM

## 2012-07-05 ENCOUNTER — Ambulatory Visit (HOSPITAL_COMMUNITY): Payer: Self-pay

## 2012-07-26 ENCOUNTER — Ambulatory Visit (HOSPITAL_COMMUNITY): Payer: Self-pay

## 2012-08-02 ENCOUNTER — Ambulatory Visit (HOSPITAL_COMMUNITY)
Admission: RE | Admit: 2012-08-02 | Discharge: 2012-08-02 | Disposition: A | Discharge status: Home / Self Care | Payer: PRIVATE HEALTH INSURANCE | Source: Ambulatory Visit | Attending: Nurse Practitioner | Admitting: Nurse Practitioner

## 2012-08-02 DIAGNOSIS — Z1231 Encounter for screening mammogram for malignant neoplasm of breast: Secondary | ICD-10-CM | POA: Insufficient documentation

## 2012-08-02 DIAGNOSIS — Z139 Encounter for screening, unspecified: Secondary | ICD-10-CM

## 2013-08-24 ENCOUNTER — Ambulatory Visit (HOSPITAL_COMMUNITY)
Admission: RE | Admit: 2013-08-24 | Discharge: 2013-08-24 | Disposition: A | Discharge status: Home / Self Care | Payer: Disability Insurance | Source: Ambulatory Visit | Attending: Family Medicine | Admitting: Family Medicine

## 2013-08-24 ENCOUNTER — Other Ambulatory Visit (HOSPITAL_COMMUNITY): Payer: Self-pay | Admitting: Family Medicine

## 2013-08-24 DIAGNOSIS — M545 Low back pain, unspecified: Secondary | ICD-10-CM

## 2013-08-24 DIAGNOSIS — M47817 Spondylosis without myelopathy or radiculopathy, lumbosacral region: Secondary | ICD-10-CM | POA: Insufficient documentation

## 2013-08-24 DIAGNOSIS — M549 Dorsalgia, unspecified: Secondary | ICD-10-CM | POA: Insufficient documentation

## 2014-02-22 ENCOUNTER — Emergency Department (HOSPITAL_COMMUNITY): Payer: Self-pay

## 2014-02-22 ENCOUNTER — Emergency Department (HOSPITAL_COMMUNITY)
Admission: EM | Admit: 2014-02-22 | Discharge: 2014-02-22 | Disposition: A | Discharge status: Home / Self Care | Payer: Self-pay | Attending: Emergency Medicine | Admitting: Emergency Medicine

## 2014-02-22 ENCOUNTER — Encounter (HOSPITAL_COMMUNITY): Payer: Self-pay | Admitting: Emergency Medicine

## 2014-02-22 DIAGNOSIS — I10 Essential (primary) hypertension: Secondary | ICD-10-CM | POA: Insufficient documentation

## 2014-02-22 DIAGNOSIS — E7800 Pure hypercholesterolemia, unspecified: Secondary | ICD-10-CM | POA: Insufficient documentation

## 2014-02-22 DIAGNOSIS — Z79899 Other long term (current) drug therapy: Secondary | ICD-10-CM | POA: Insufficient documentation

## 2014-02-22 DIAGNOSIS — Z880 Allergy status to penicillin: Secondary | ICD-10-CM | POA: Insufficient documentation

## 2014-02-22 DIAGNOSIS — R109 Unspecified abdominal pain: Secondary | ICD-10-CM | POA: Insufficient documentation

## 2014-02-22 DIAGNOSIS — Z8719 Personal history of other diseases of the digestive system: Secondary | ICD-10-CM | POA: Insufficient documentation

## 2014-02-22 DIAGNOSIS — R519 Headache, unspecified: Secondary | ICD-10-CM

## 2014-02-22 DIAGNOSIS — R51 Headache: Secondary | ICD-10-CM | POA: Insufficient documentation

## 2014-02-22 DIAGNOSIS — E119 Type 2 diabetes mellitus without complications: Secondary | ICD-10-CM | POA: Insufficient documentation

## 2014-02-22 LAB — CBC WITH DIFFERENTIAL/PLATELET
Basophils Absolute: 0.1 10*3/uL (ref 0.0–0.1)
Basophils Relative: 1 % (ref 0–1)
EOS PCT: 2 % (ref 0–5)
Eosinophils Absolute: 0.1 10*3/uL (ref 0.0–0.7)
HEMATOCRIT: 35.5 % — AB (ref 36.0–46.0)
HEMOGLOBIN: 11.3 g/dL — AB (ref 12.0–15.0)
LYMPHS PCT: 31 % (ref 12–46)
Lymphs Abs: 2.7 10*3/uL (ref 0.7–4.0)
MCH: 26.2 pg (ref 26.0–34.0)
MCHC: 31.8 g/dL (ref 30.0–36.0)
MCV: 82.2 fL (ref 78.0–100.0)
MONO ABS: 0.5 10*3/uL (ref 0.1–1.0)
MONOS PCT: 6 % (ref 3–12)
NEUTROS ABS: 5.3 10*3/uL (ref 1.7–7.7)
Neutrophils Relative %: 60 % (ref 43–77)
Platelets: 309 10*3/uL (ref 150–400)
RBC: 4.32 MIL/uL (ref 3.87–5.11)
RDW: 14.2 % (ref 11.5–15.5)
WBC: 8.7 10*3/uL (ref 4.0–10.5)

## 2014-02-22 LAB — COMPREHENSIVE METABOLIC PANEL
ALBUMIN: 3.6 g/dL (ref 3.5–5.2)
ALK PHOS: 112 U/L (ref 39–117)
ALT: 10 U/L (ref 0–35)
ANION GAP: 12 (ref 5–15)
AST: 16 U/L (ref 0–37)
BILIRUBIN TOTAL: 0.3 mg/dL (ref 0.3–1.2)
BUN: 21 mg/dL (ref 6–23)
CHLORIDE: 99 meq/L (ref 96–112)
CO2: 27 mEq/L (ref 19–32)
Calcium: 9.6 mg/dL (ref 8.4–10.5)
Creatinine, Ser: 0.97 mg/dL (ref 0.50–1.10)
GFR calc Af Amer: 70 mL/min — ABNORMAL LOW (ref >90)
GFR calc non Af Amer: 60 mL/min — ABNORMAL LOW (ref >90)
Glucose, Bld: 102 mg/dL — ABNORMAL HIGH (ref 70–99)
POTASSIUM: 3.8 meq/L (ref 3.7–5.3)
Sodium: 138 mEq/L (ref 137–147)
TOTAL PROTEIN: 7.7 g/dL (ref 6.0–8.3)

## 2014-02-22 LAB — URINALYSIS, ROUTINE W REFLEX MICROSCOPIC
BILIRUBIN URINE: NEGATIVE
Glucose, UA: NEGATIVE mg/dL
Hgb urine dipstick: NEGATIVE
Ketones, ur: NEGATIVE mg/dL
LEUKOCYTES UA: NEGATIVE
NITRITE: NEGATIVE
PH: 7 (ref 5.0–8.0)
Protein, ur: NEGATIVE mg/dL
SPECIFIC GRAVITY, URINE: 1.019 (ref 1.005–1.030)
UROBILINOGEN UA: 0.2 mg/dL (ref 0.0–1.0)

## 2014-02-22 LAB — I-STAT TROPONIN, ED: TROPONIN I, POC: 0.01 ng/mL (ref 0.00–0.08)

## 2014-02-22 LAB — SEDIMENTATION RATE: Sed Rate: 55 mm/hr — ABNORMAL HIGH (ref 0–22)

## 2014-02-22 MED ORDER — RANITIDINE HCL 150 MG PO TABS: 150.0000 mg | ORAL_TABLET | Freq: Two times a day (BID) | ORAL | Status: DC

## 2014-02-22 MED ORDER — PREDNISONE 10 MG PO TABS: 20.0000 mg | ORAL_TABLET | Freq: Every day | ORAL | Status: DC

## 2014-02-22 MED ORDER — GI COCKTAIL ~~LOC~~
30.0000 mL | Freq: Once | ORAL | Status: AC
  Administered 2014-02-22: 30 mL via ORAL
  Filled 2014-02-22: qty 30

## 2014-02-22 MED ORDER — PREDNISONE 20 MG PO TABS
40.0000 mg | ORAL_TABLET | Freq: Once | ORAL | Status: AC
  Administered 2014-02-22: 40 mg via ORAL
  Filled 2014-02-22: qty 2

## 2014-02-22 NOTE — Progress Notes (Signed)
ED CM consulted concerning establishing f/u care. Patient presented to [facility] ED with c/o headaches.  Spoke with patient she reports not having a PCP,or health coverage. Discussed the [facility] for  f/u and establishing care. Patient agreeable. Surgery consult placed, and pending . Spoke with Dr. Milagros Evener concerning patient following up with [facility]. Discussed the [facility]. Patient would not be eligible for the [place] not a [facility] resident. ED CM will contact the [facility] to schedule an appt. Appt scheduled for 827/15 at 14:30 with Dr. Loma Messing. Patient made aware, and reminder if she is not able to keep appt contact the clinic with in 24 hours to cancel. Patient verbalized understanding teach back done. Contact info given to if any questions or concerns should arise.

## 2014-02-22 NOTE — ED Notes (Addendum)
Pt reports went to PCP a week ago, c/o 3 episodes of blacking out 1 month ago. Pt was told to come to ED but didn't have transportation until today. C/o of having a headache everyday and sorta has one today. Also the left side of her face is darker than normal. Reports her PCP told her to come for test of her head if h/a's continue. Also burning in stomach. Pt is a x 4. Speech is clear and concise, is ambulatory. Denies CP. Is neurologically intact.

## 2014-02-22 NOTE — ED Notes (Signed)
Pt states she goes to [facility].

## 2014-02-22 NOTE — ED Provider Notes (Signed)
CSN: [id number]     Arrival date & time 02/22/14  1601 History   First MD Initiated Contact with Patient 02/22/14 1859     Chief Complaint  Patient presents with  . Headache     (Consider location/radiation/quality/duration/timing/severity/associated sxs/prior Treatment) HPI Patient sent here from her primary care physician at the health department today. Patient reports she's been having intermittent headaches, symptoms at the top sometimes and bitemporal areas onset several months ago. Patient reports she had 3 syncopal episodes in July of 2015, none since she initially treated her headache with Tylenol which did not help her primary care physician switched her to aspirin. She reports that aspirin upsets her stomach. She also complains of intermittent epigastric pain for several months, worse with eating. Last bowel movement yesterday, normal does admit to having a slight amount of blood in her stool one month ago. None since she denies chest pain denies shortness of breath. States her skin on the left side of her face has become "darker" since she suffered syncopal event 1 month ago. Presently headache is mild. No visual changes. No fever. No vomiting. No other associated symptoms . Reports her primary care physician she may have had a stroke one month ago because she was dragging her right leg one month ago. Gait has been normal for one month Past Medical History  Diagnosis Date  . Hypertension   . Diabetes mellitus   . Hypercholesteremia   . Acid reflux    Past Surgical History  Procedure Laterality Date  . Abdominal hysterectomy    . Tubal ligation     No family history on file. History  Substance Use Topics  . Smoking status: Never Smoker   . Smokeless tobacco: Not on file  . Alcohol Use: No   OB History   Grav Para Term Preterm Abortions TAB SAB Ect Mult Living                 Review of Systems  Cardiovascular:       Syncope  Gastrointestinal: Positive for abdominal  pain.  Neurological: Positive for headaches.       She reports she was dragging her right leg one month ago. Gait has been normal for one month.  All other systems reviewed and are negative.     Allergies  Penicillins  Home Medications   Prior to Admission medications   Medication Sig Start Date End Date Taking? Authorizing Provider  lisinopril-hydrochlorothiazide (PRINZIDE,ZESTORETIC) 20-12.5 MG per tablet Take 1 tablet by mouth daily.     Yes Historical Provider, MD  pravastatin (PRAVACHOL) 20 MG tablet Take 20 mg by mouth daily.     Yes Historical Provider, MD   BP 170/75  Pulse 76  Temp(Src) 98.4 F (36.9 C) (Oral)  Resp 20  Ht 5\' 3"  (1.6 m)  Wt 225 lb (102.059 kg)  BMI 39.87 kg/m2  SpO2 96% Physical Exam  Nursing note and vitals reviewed. Constitutional: She is oriented to person, place, and time. She appears well-developed and well-nourished.  HENT:  Head: Normocephalic and atraumatic.  Eyes: Conjunctivae are normal. Pupils are equal, round, and reactive to light.  Neck: Neck supple. No tracheal deviation present. No thyromegaly present.  Cardiovascular: Normal rate and regular rhythm.   No murmur heard. Pulmonary/Chest: Effort normal and breath sounds normal.  Abdominal: Soft. Bowel sounds are normal. She exhibits no distension. There is no tenderness.  Musculoskeletal: Normal range of motion. She exhibits no edema and no tenderness.  Neurological: She is  alert and oriented to person, place, and time. She has normal reflexes. No cranial nerve deficit. Coordination normal.  Finger-nose normal gait normal Romberg normal pronator drift normal DTRs symmetric bilaterally knee jerk ankle jerk biceps, toes downgoing bilaterally  Skin: Skin is warm and dry. No rash noted.  Psychiatric: She has a normal mood and affect.    ED Course  Procedures (including critical care time) Labs Review Labs Reviewed - No data to display  Imaging Review No results found.   EKG  Interpretation   Date/Time:  Thursday February 22 2014 20:01:55 EDT Ventricular Rate:  64 PR Interval:  175 QRS Duration: 85 QT Interval:  415 QTC Calculation: 428 R Axis:   -22 Text Interpretation:  Sinus rhythm LVH by voltage No significant change  since last tracing Confirmed by Analyse Angst  MD, Charlayne Vultaggio ([id number]) on 02/22/2014  8:42:00 PM       11:05 PM patient resting comfortably, in no distress.   Results for orders placed during the hospital encounter of 02/22/14  URINALYSIS, ROUTINE W REFLEX MICROSCOPIC      Result Value Ref Range   Color, Urine YELLOW  YELLOW   APPearance CLEAR  CLEAR   Specific Gravity, Urine 1.019  1.005 - 1.030   pH 7.0  5.0 - 8.0   Glucose, UA NEGATIVE  NEGATIVE mg/dL   Hgb urine dipstick NEGATIVE  NEGATIVE   Bilirubin Urine NEGATIVE  NEGATIVE   Ketones, ur NEGATIVE  NEGATIVE mg/dL   Protein, ur NEGATIVE  NEGATIVE mg/dL   Urobilinogen, UA 0.2  0.0 - 1.0 mg/dL   Nitrite NEGATIVE  NEGATIVE   Leukocytes, UA NEGATIVE  NEGATIVE  COMPREHENSIVE METABOLIC PANEL      Result Value Ref Range   Sodium 138  137 - 147 mEq/L   Potassium 3.8  3.7 - 5.3 mEq/L   Chloride 99  96 - 112 mEq/L   CO2 27  19 - 32 mEq/L   Glucose, Bld 102 (*) 70 - 99 mg/dL   BUN 21  6 - 23 mg/dL   Creatinine, Ser 0.97  0.50 - 1.10 mg/dL   Calcium 9.6  8.4 - 10.5 mg/dL   Total Protein 7.7  6.0 - 8.3 g/dL   Albumin 3.6  3.5 - 5.2 g/dL   AST 16  0 - 37 U/L   ALT 10  0 - 35 U/L   Alkaline Phosphatase 112  39 - 117 U/L   Total Bilirubin 0.3  0.3 - 1.2 mg/dL   GFR calc non Af Amer 60 (*) >90 mL/min   GFR calc Af Amer 70 (*) >90 mL/min   Anion gap 12  5 - 15  CBC WITH DIFFERENTIAL      Result Value Ref Range   WBC 8.7  4.0 - 10.5 K/uL   RBC 4.32  3.87 - 5.11 MIL/uL   Hemoglobin 11.3 (*) 12.0 - 15.0 g/dL   HCT 35.5 (*) 36.0 - 46.0 %   MCV 82.2  78.0 - 100.0 fL   MCH 26.2  26.0 - 34.0 pg   MCHC 31.8  30.0 - 36.0 g/dL   RDW 14.2  11.5 - 15.5 %   Platelets 309  150 - 400 K/uL   Neutrophils  Relative % 60  43 - 77 %   Neutro Abs 5.3  1.7 - 7.7 K/uL   Lymphocytes Relative 31  12 - 46 %   Lymphs Abs 2.7  0.7 - 4.0 K/uL   Monocytes Relative 6  3 -  12 %   Monocytes Absolute 0.5  0.1 - 1.0 K/uL   Eosinophils Relative 2  0 - 5 %   Eosinophils Absolute 0.1  0.0 - 0.7 K/uL   Basophils Relative 1  0 - 1 %   Basophils Absolute 0.1  0.0 - 0.1 K/uL  SEDIMENTATION RATE      Result Value Ref Range   Sed Rate 55 (*) 0 - 22 mm/hr  I-STAT TROPOININ, ED      Result Value Ref Range   Troponin i, poc 0.01  0.00 - 0.08 ng/mL   Comment 3           2035 pm c/o epigastric burning.  2100 feels improved after treatment with GI cocktasil Ct Head Wo Contrast  02/22/2014   CLINICAL DATA:  Syncopal episodes.  EXAM: CT HEAD WITHOUT CONTRAST  TECHNIQUE: Contiguous axial images were obtained from the base of the skull through the vertex without intravenous contrast.  COMPARISON:  01/06/2009.  FINDINGS: The ventricles are normal in size and configuration. No extra-axial fluid collections are identified. The gray-white differentiation is normal. No CT findings for acute intracranial process such as hemorrhage or infarction. No mass lesions. The brainstem and cerebellum are grossly normal.  The bony structures are intact. The paranasal sinuses and mastoid air cells are clear. The globes are intact.  IMPRESSION: No acute intracranial findings or mass lesions. No change when compared to prior examination.   Electronically Signed   By: Kalman Jewels M.D.   On: 02/22/2014 22:07   s MDM  Concern for temporal arteritis with elevated sedimentation rate and vague headache. I spoke with Dr. Grandville Silos. It is okay to refer patient to [facility] to perform temporal artery biopsy. Patient will be registered with the [facility]. Result of temporal artery biopsy can be followed to the [facility] Final diagnoses:  None  Rx's prednisone and ranitadine Dx #1 chronic  headache #2 chronic abdominal pain   Orlie Dakin, MD 02/22/14 2320

## 2014-02-22 NOTE — ED Notes (Signed)
Pt coughing post med administration. States that she has had difficulty swallowing ever since she got her tonsils removed when she was 63 years old. Pt also c/o urinary frequency. Was told in 1999 that she needed her bladder tacked but never had it done.

## 2014-02-22 NOTE — Discharge Instructions (Signed)
Call [facility] surgery tomorrow to schedule an office visit. Tell office staff that you need a temporal artery biopsy, that you have been seen here, and that your result is to go to the [facility] and [facility] who will be your new primary care physician. Take your medications as prescribed

## 2014-02-23 NOTE — Progress Notes (Signed)
  CARE MANAGEMENT ED NOTE 02/23/2014  Patient:  Sandra [name], Sandra [name]   Account Number:  [id number]  Date Initiated:  02/23/2014  Documentation initiated by:  Edwyna Shell  Subjective/Objective Assessment:   63 yo female called the ED regarding referral made during ED visit     Subjective/Objective Assessment Detail:     Action/Plan:   Patient will follow up at the [facility] for PCP appointment and charity program application   Action/Plan Detail:   Anticipated DC Date:       Status Recommendation to Physician:   Result of Recommendation:  Agreed    DC Planning Services  CM consult  Other    Choice offered to / List presented to:            Status of service:  Completed, signed off  ED Comments:   ED Comments Detail:  This CM received a phone call from the patient requesting assistance and clarification with several referrals and information provided the previous day during her ED visit. The patient stated that she received a referral for [facility] but she stated that when she called up today to make the appointment they informed her that they are no longer taking any orange card patients. Then upon further clarification, the patient stated that she does not have the orange card but was told to apply for it. This CM confirmed that the patient is a [facility] resident and upon confirmation explained to the patient that she is not a candidate for the orange card. This CM explained that the orange card is a program for [facility] residents only. This CM did encourage the patient to contact the [facility] to apply for the [facility], which is a Public librarian program for all [place] residents and she can receive referrals to surgical centers under this program. The patient did state that she goes to the [facility] Department every 6 months for medical care. This CM encouraged the patient to establish care with a PCP at the [facility] to have all  services under one roof and then they could refer her out if necessary. The patient stated that she had the contact number for the [facility] and verbalized [phone]. This CM provided contact information that if the patient has any other questions or concern she can call back for further assistance.

## 2014-03-01 ENCOUNTER — Encounter: Payer: Self-pay | Admitting: Family Medicine

## 2014-03-01 ENCOUNTER — Ambulatory Visit: Payer: Self-pay | Attending: Family Medicine | Admitting: Family Medicine

## 2014-03-01 VITALS — BP 137/82 | HR 56 | Temp 98.3°F | Resp 14 | Ht 63.0 in | Wt 224.0 lb

## 2014-03-01 DIAGNOSIS — R519 Headache, unspecified: Secondary | ICD-10-CM | POA: Insufficient documentation

## 2014-03-01 DIAGNOSIS — R51 Headache: Secondary | ICD-10-CM | POA: Insufficient documentation

## 2014-03-01 DIAGNOSIS — E119 Type 2 diabetes mellitus without complications: Secondary | ICD-10-CM

## 2014-03-01 DIAGNOSIS — E7800 Pure hypercholesterolemia, unspecified: Secondary | ICD-10-CM

## 2014-03-01 DIAGNOSIS — K219 Gastro-esophageal reflux disease without esophagitis: Secondary | ICD-10-CM | POA: Insufficient documentation

## 2014-03-01 DIAGNOSIS — I10 Essential (primary) hypertension: Secondary | ICD-10-CM | POA: Insufficient documentation

## 2014-03-01 DIAGNOSIS — R7309 Other abnormal glucose: Secondary | ICD-10-CM | POA: Insufficient documentation

## 2014-03-01 DIAGNOSIS — Z113 Encounter for screening for infections with a predominantly sexual mode of transmission: Secondary | ICD-10-CM | POA: Insufficient documentation

## 2014-03-01 LAB — GLUCOSE, POCT (MANUAL RESULT ENTRY): POC Glucose: 121 mg/dl — AB (ref 70–99)

## 2014-03-01 LAB — POCT GLYCOSYLATED HEMOGLOBIN (HGB A1C): HEMOGLOBIN A1C: 6.1

## 2014-03-01 MED ORDER — AMITRIPTYLINE HCL 25 MG PO TABS: 25.0000 mg | ORAL_TABLET | Freq: Every day | ORAL | Status: DC

## 2014-03-01 MED ORDER — LISINOPRIL-HYDROCHLOROTHIAZIDE 20-12.5 MG PO TABS: 1.0000 | ORAL_TABLET | Freq: Every day | ORAL | Status: DC

## 2014-03-01 NOTE — Assessment & Plan Note (Addendum)
Screening HIV  Screening HIV negative,

## 2014-03-01 NOTE — Assessment & Plan Note (Signed)
A: at goal Meds: complaint P: continue current regimen

## 2014-03-01 NOTE — Assessment & Plan Note (Signed)
I am most suspicious of chronic tension headache versus trigeminal neuralgia I would like to rule out temporal arteritis because the scan are near vision although I am less suspicious for this. I placed referral to general surgery for temporal artery biopsy. We will repeat. Sedimentation rate. Please start Elavil 25 mg at night for headache prevention.

## 2014-03-01 NOTE — Patient Instructions (Addendum)
Ms. Honold,  Thank you for coming in today. It was a pleasure meeting you. I look forward to being your primary doctor and  seeing you again.  For headache: I am most suspicious of chronic tension headache versus trigeminal neuralgia I would like to rule out temporal arteritis because the scan are near vision although I am less suspicious for this. I placed referral to general surgery for temporal artery biopsy. We will repeat. Sedimentation rate. Please start Elavil 25 mg at night for headache prevention.  Please make if he keep your followup appointment with financial advisor.  Please see me in followup in 2 weeks.  Dr. Adrian Blackwater

## 2014-03-01 NOTE — Progress Notes (Signed)
Pt is here to establish care. Pt reports having chronic headaches for 8 years. Recently the pain is severe.

## 2014-03-01 NOTE — Assessment & Plan Note (Signed)
A: prediabetes A1c P: exercise and low carb diet

## 2014-03-01 NOTE — Progress Notes (Signed)
   Subjective:    Patient ID: Sandra [name], female    DOB: [birth year], 63 y.o.   MRN: [id number] CC: ED f/u for headaches, establish care,  HPI 63 yo F presents to establish care and discuss the following:  1. Headache: x 8 years. Worsening over past year. Burning and aching sensation back and side of head. Occurs 2-3 tines daily. Last 30-60 mins. Usually L side. Whole head sometimes. Pain is 11/10 at worse. Associated symptoms blurred vision. Has blacked out in the past. Has experienced L sided weakness in leg that is improving. Triggers include food.  Patient seen in the ED on 02/22/2014 with severe headache. Evaluation revealed a normal CT head. The sedimentation rate was slightly elevated at 55. She started on prednisone 20 mg daily for 6 days due to concern for temporal arteritis. Since taking prednisone patient reports no improvement. No personal history of menstrual migraines. Daughter and mom with headaches.   2. H/o prediabetes DM2: no medication ever needed.   3. HTN: has HA as per above. No CP, SOB. Sometimes has swelling in legs L>R.   Soc hx: chronic non smoker  Review of Systems As per HPI     Objective:   Physical Exam BP 137/82  Pulse 56  Temp(Src) 98.3 F (36.8 C) (Oral)  Resp 14  Ht 5\' 3"  (1.6 m)  Wt 224 lb (101.606 kg)  BMI 39.69 kg/m2  SpO2 96% General appearance: alert, cooperative, no distress and mildly obese Head: Normocephalic, without obvious abnormality, atraumatic, sinuses nontender to percussion Eyes: conjunctivae/corneas clear. PERRL, EOM's intact. Fundi benign. Ears: normal TM's and external ear canals both ears Nose: Nares normal. Septum midline. Mucosa normal. No drainage or sinus tenderness. Neck: no adenopathy, supple, symmetrical, trachea midline and thyroid not enlarged, symmetric, no tenderness/mass/nodules Lungs: clear to auscultation bilaterally Heart: regular rate and rhythm, S1, S2 normal, no murmur, click, rub or gallop Extremities:  extremities normal, atraumatic, no cyanosis or edema Neurologic: Alert and oriented X 3, normal strength and tone. Normal symmetric reflexes. Normal coordination and gait     Assessment & Plan:

## 2014-03-01 NOTE — Assessment & Plan Note (Addendum)
A: per history. No currently on statin P: lipid panel   Reviewed lipid panel lipitor 10 mg daily to replace pravastatin.

## 2014-03-02 LAB — LIPID PANEL
Cholesterol: 164 mg/dL (ref 0–200)
HDL: 43 mg/dL (ref >39)
LDL Cholesterol: 72 mg/dL (ref 0–99)
Total CHOL/HDL Ratio: 3.8 Ratio
Triglycerides: 244 mg/dL — ABNORMAL HIGH (ref <150)
VLDL: 49 mg/dL — ABNORMAL HIGH (ref 0–40)

## 2014-03-02 LAB — HIV ANTIBODY (ROUTINE TESTING W REFLEX): HIV: NONREACTIVE

## 2014-03-02 LAB — SEDIMENTATION RATE: SED RATE: 28 mm/h — AB (ref 0–22)

## 2014-03-02 MED ORDER — ATORVASTATIN CALCIUM 10 MG PO TABS: 10.0000 mg | ORAL_TABLET | Freq: Every day | ORAL | Status: DC

## 2014-03-02 NOTE — Addendum Note (Signed)
Addended by: Boykin Nearing on: 03/02/2014 04:37 PM   Modules accepted: Orders, Medications

## 2014-03-05 ENCOUNTER — Telehealth: Payer: Self-pay | Admitting: *Deleted

## 2014-03-05 NOTE — Telephone Encounter (Signed)
Message copied by Betti Cruz on Mon Mar 05, 2014  2:37 PM ------      Message from: Boykin Nearing      Created: Fri Mar 02, 2014  4:35 PM       Please call patient.      Screening HIV negative      Sed rate improved, down, since taking prednisone.       Reviewed labs. Cholesterol a bit elevated so will change to a more potent statin, once out of current start lipitor 10 mg daily,  New rx sent to [facility] pharmacy. ------

## 2014-03-05 NOTE — Telephone Encounter (Signed)
Pt stated has abd pain lt side staring after taking Ranitidine. Pt wants to know what she need to do?

## 2014-03-05 NOTE — Telephone Encounter (Signed)
Pt aware of lab results 

## 2014-03-06 NOTE — Telephone Encounter (Signed)
Patient advised by my CMA to stop taking ranitidine, she had already stopped

## 2014-03-15 ENCOUNTER — Ambulatory Visit: Payer: Self-pay | Attending: Family Medicine | Admitting: Family Medicine

## 2014-03-15 ENCOUNTER — Encounter: Payer: Self-pay | Admitting: Family Medicine

## 2014-03-15 VITALS — BP 122/71 | HR 70 | Temp 98.2°F | Resp 18 | Ht 62.0 in | Wt 226.0 lb

## 2014-03-15 DIAGNOSIS — B372 Candidiasis of skin and nail: Secondary | ICD-10-CM

## 2014-03-15 DIAGNOSIS — I10 Essential (primary) hypertension: Secondary | ICD-10-CM | POA: Insufficient documentation

## 2014-03-15 DIAGNOSIS — N611 Abscess of the breast and nipple: Secondary | ICD-10-CM | POA: Insufficient documentation

## 2014-03-15 DIAGNOSIS — N63 Unspecified lump in unspecified breast: Secondary | ICD-10-CM | POA: Insufficient documentation

## 2014-03-15 DIAGNOSIS — R21 Rash and other nonspecific skin eruption: Secondary | ICD-10-CM | POA: Insufficient documentation

## 2014-03-15 DIAGNOSIS — N61 Mastitis without abscess: Secondary | ICD-10-CM

## 2014-03-15 DIAGNOSIS — R51 Headache: Secondary | ICD-10-CM | POA: Insufficient documentation

## 2014-03-15 DIAGNOSIS — Z23 Encounter for immunization: Secondary | ICD-10-CM

## 2014-03-15 MED ORDER — METOPROLOL TARTRATE 25 MG PO TABS: 25.0000 mg | ORAL_TABLET | Freq: Two times a day (BID) | ORAL | Status: DC

## 2014-03-15 MED ORDER — LISINOPRIL-HYDROCHLOROTHIAZIDE 20-12.5 MG PO TABS: 1.0000 | ORAL_TABLET | Freq: Every day | ORAL | Status: DC

## 2014-03-15 MED ORDER — FLUCONAZOLE 100 MG PO TABS: 100.0000 mg | ORAL_TABLET | Freq: Every day | ORAL | Status: AC

## 2014-03-15 NOTE — Assessment & Plan Note (Addendum)
A: improved. Just slight headache on top of head. Patient prefers not to take elavil.  P: Stop elavil  F/u with surg for temporal artery biopsy

## 2014-03-15 NOTE — Patient Instructions (Signed)
Sandra [name],  Thank you for coming back in today. Please continue your current medication regimen.   Regarding: HTN, BP is great. Continue current regimen.  Regarding breast: you have a small abscess that is healing on it's own. Breast rash take difuan 100 mg daily for 5 days.    Stop elavil for headache.  F/u in 3 months for hypertension management.   Dr. Adrian Blackwater

## 2014-03-15 NOTE — Progress Notes (Signed)
   Subjective:    Patient ID: Sandra [name], female    DOB: [birth year], 63 y.o.   MRN: [id number] CC: HTN f/u  HPI 1. CHRONIC HYPERTENSION  Disease Monitoring  Blood pressure range: does not check   Chest pain: no   Dyspnea: no   Claudication: no   Medication compliance: yes  Medication Side Effects  Lightheadedness: no   Urinary frequency: no   Edema: no   Impotence: no   Preventitive Healthcare:  Exercise: no   Diet Pattern: regular diet  Salt Restriction: yes   2. Breast bumps: rash under breas t x 1 year.  Also gets bumps that get enlarged and occasionally drain off and on x 6 years. Has one now on R breast.  3. Headache: improved. Top of head headache now is mild. No vision loss. No temple pain. Would like to stop elavil.   Soc Hx: non smoker  Review of Systems As per HPI     Objective:   Physical Exam BP 122/71  Pulse 70  Temp(Src) 98.2 F (36.8 C) (Oral)  Resp 18  Ht 5\' 2"  (1.575 m)  Wt 226 lb (102.513 kg)  BMI 41.33 kg/m2  SpO2 98% BP Readings from Last 3 Encounters:  03/15/14 122/71  03/01/14 137/82  02/22/14 159/82  General appearance: alert and cooperative Lungs: clear to auscultation bilaterally Heart: regular rate and rhythm, S1, S2 normal, no murmur, click, rub or gallop Breast: mild excoriation under both breast. 1.5 x 0.5 cm indurated area R lateral lower breast.  Extremities: extremities normal, atraumatic, no cyanosis or edema       Assessment & Plan:

## 2014-03-15 NOTE — Assessment & Plan Note (Signed)
A: BP at goal Meds: compliant P: continue current regimen

## 2014-03-15 NOTE — Progress Notes (Signed)
F/U HTN pt stated not problems, sometime has slight HA daily

## 2014-03-15 NOTE — Assessment & Plan Note (Signed)
A: R lateral breast, small, indurated. No need for I&D P: Cool compress Reviewed red flags F/u as needed

## 2014-03-15 NOTE — Assessment & Plan Note (Signed)
A: mid rash P:  Diflucan x 5 days

## 2014-03-20 ENCOUNTER — Ambulatory Visit (INDEPENDENT_AMBULATORY_CARE_PROVIDER_SITE_OTHER): Payer: Self-pay | Admitting: Surgery

## 2014-03-23 ENCOUNTER — Emergency Department (HOSPITAL_COMMUNITY)
Admission: EM | Admit: 2014-03-23 | Discharge: 2014-03-23 | Disposition: A | Discharge status: Home / Self Care | Payer: Self-pay | Attending: Emergency Medicine | Admitting: Emergency Medicine

## 2014-03-23 ENCOUNTER — Encounter (HOSPITAL_COMMUNITY): Payer: Self-pay | Admitting: Emergency Medicine

## 2014-03-23 ENCOUNTER — Telehealth: Payer: Self-pay | Admitting: Family Medicine

## 2014-03-23 DIAGNOSIS — I10 Essential (primary) hypertension: Secondary | ICD-10-CM | POA: Insufficient documentation

## 2014-03-23 DIAGNOSIS — Z880 Allergy status to penicillin: Secondary | ICD-10-CM | POA: Insufficient documentation

## 2014-03-23 DIAGNOSIS — E7800 Pure hypercholesterolemia, unspecified: Secondary | ICD-10-CM | POA: Insufficient documentation

## 2014-03-23 DIAGNOSIS — Z79899 Other long term (current) drug therapy: Secondary | ICD-10-CM | POA: Insufficient documentation

## 2014-03-23 DIAGNOSIS — R221 Localized swelling, mass and lump, neck: Secondary | ICD-10-CM

## 2014-03-23 DIAGNOSIS — R600 Localized edema: Secondary | ICD-10-CM

## 2014-03-23 DIAGNOSIS — R220 Localized swelling, mass and lump, head: Secondary | ICD-10-CM | POA: Insufficient documentation

## 2014-03-23 DIAGNOSIS — E119 Type 2 diabetes mellitus without complications: Secondary | ICD-10-CM | POA: Insufficient documentation

## 2014-03-23 DIAGNOSIS — R609 Edema, unspecified: Secondary | ICD-10-CM | POA: Insufficient documentation

## 2014-03-23 DIAGNOSIS — K219 Gastro-esophageal reflux disease without esophagitis: Secondary | ICD-10-CM | POA: Insufficient documentation

## 2014-03-23 MED ORDER — SULFAMETHOXAZOLE-TRIMETHOPRIM 800-160 MG PO TABS: 1.0000 | ORAL_TABLET | Freq: Two times a day (BID) | ORAL | Status: DC

## 2014-03-23 MED ORDER — HYDROCHLOROTHIAZIDE 25 MG PO TABS: 25.0000 mg | ORAL_TABLET | Freq: Every day | ORAL | Status: DC

## 2014-03-23 MED ORDER — SULFAMETHOXAZOLE-TMP DS 800-160 MG PO TABS
1.0000 | ORAL_TABLET | Freq: Once | ORAL | Status: AC
  Administered 2014-03-23: 1 via ORAL
  Filled 2014-03-23: qty 1

## 2014-03-23 NOTE — ED Provider Notes (Signed)
CSN: [id number]     Arrival date & time 03/23/14  1011 History  This chart was scribed for Nat Christen, MD by Rayfield Citizen, ED Scribe. This patient was seen in room APA19/APA19 and the patient's care was started at 10:31 AM.    Chief Complaint  Patient presents with  . Oral Swelling   The history is provided by the patient. No language interpreter was used.   HPI Comments: Sandra [name] is a 63 y.o. female who presents to the Emergency Department complaining of several days of gradual onset, constant, gradually worsened gum discomfort and oral swelling; her gum discomfort began two days ago and she noticed her lip swelling as of this morning. She states she is taking lisinopril. She denies dental pain, neck pain or soreness.   Past Medical History  Diagnosis Date  . Diabetes mellitus 2012    borderline diabetes, never on medicine   . Acid reflux 2000    HA with nexium   . Enteritis 04/23/2011  . Hypercholesteremia 2011  . Hypertension 1990s    Past Surgical History  Procedure Laterality Date  . Abdominal hysterectomy  1983   . Incontinence surgery  1990   . Tubal ligation  1983    Family History  Problem Relation Age of Onset  . Diabetes Mother   . Heart disease Mother   . Stroke Mother   . Diabetes Maternal Aunt   . Cancer Maternal Aunt 60    stomach   . Diabetes Maternal Uncle   . Cancer Daughter 53    breast    History  Substance Use Topics  . Smoking status: Never Smoker   . Smokeless tobacco: Never Used  . Alcohol Use: No   OB History   Grav Para Term Preterm Abortions TAB SAB Ect Mult Living                 Review of Systems  A complete 10 system review of systems was obtained and all systems are negative except as noted in the HPI and PMH.    Allergies  Penicillins  Home Medications   Prior to Admission medications   Medication Sig Start Date End Date Taking? Authorizing Provider  atorvastatin (LIPITOR) 10 MG tablet Take 1 tablet (10 mg total)  by mouth daily. 03/02/14  Yes Josalyn C Funches, MD  lisinopril-hydrochlorothiazide (PRINZIDE,ZESTORETIC) 20-12.5 MG per tablet Take 1 tablet by mouth daily. 03/15/14  Yes Josalyn C Funches, MD  metoprolol tartrate (LOPRESSOR) 25 MG tablet Take 1 tablet (25 mg total) by mouth 2 (two) times daily. 03/15/14  Yes Josalyn C Funches, MD  ranitidine (ZANTAC) 150 MG tablet Take 1 tablet (150 mg total) by mouth 2 (two) times daily. 02/22/14  Yes Orlie Dakin, MD  sulfamethoxazole-trimethoprim (SEPTRA DS) 800-160 MG per tablet Take 1 tablet by mouth 2 (two) times daily. 03/23/14   Nat Christen, MD   BP 178/81  Pulse 78  Temp(Src) 98.1 F (36.7 C) (Oral)  Resp 18  Ht 5\' 3"  (1.6 m)  Wt 226 lb (102.513 kg)  BMI 40.04 kg/m2  SpO2 98% Physical Exam  Nursing note and vitals reviewed. Constitutional: She is oriented to person, place, and time. She appears well-developed and well-nourished.  HENT:  Head: Normocephalic and atraumatic.  Slight edema in right lower lip; tender in right lower anterior gingiva; tender in cheek and chin on the right inferior area   Eyes: Conjunctivae and EOM are normal. Pupils are equal, round, and reactive to light.  Neck: Normal range of motion. Neck supple.  Cardiovascular: Normal rate, regular rhythm and normal heart sounds.   Pulmonary/Chest: Effort normal and breath sounds normal.  Abdominal: Soft. Bowel sounds are normal.  Musculoskeletal: Normal range of motion.  Neurological: She is alert and oriented to person, place, and time.  Skin: Skin is warm and dry.  Psychiatric: She has a normal mood and affect. Her behavior is normal.    ED Course  Procedures   DIAGNOSTIC STUDIES: Oxygen Saturation is 98% on RA, normal by my interpretation.    COORDINATION OF CARE: 10:32 AM Discussed clinical suspicion of a small infection in her mouth; patient will be started on Abx. Patient agreed to plan.  Labs Review Labs Reviewed - No data to display  Imaging Review No  results found.   EKG Interpretation None      MDM   Final diagnoses:  Lip edema   I do not think this lip swelling is related to her ACE inhibitor.  Symptoms appear to be more infectious, i.e. early abscess.  Rx Septra DS twice a day and apply moist hot pack.  Return if worse.   I personally performed the services described in this documentation, which was scribed in my presence. The recorded information has been reviewed and is accurate.      Nat Christen, MD 03/23/14 1245

## 2014-03-23 NOTE — Discharge Instructions (Signed)
Hot moist pack to lip.   Antibiotic twice a day. Return if worse.

## 2014-03-23 NOTE — ED Notes (Signed)
Pt reports right sided lip swelling since Wednesday. Pt reports started new medication x2 weeks but denies any new self care products. Airway patent. nad noted.

## 2014-03-23 NOTE — Telephone Encounter (Signed)
Pt notified, will discontinue using Lisinopril  HCTZ 25 mg e-scribe to [employer]

## 2014-03-23 NOTE — ED Notes (Addendum)
Pt has lower lip swelling this morning,  Has had discomfort in lower gum since Wednesday.  Alert,No resp distress  Pt is on lisinopril

## 2014-03-23 NOTE — ED Notes (Addendum)
Pt states pain to right lower gum began Wednesday and swelling to bottom right lip began "a little" yesterday. NAD. White patch also noted to inner lower lip where pain and swelling is occuring.  Pt is on lisinopril and states intermittent swelling to both lips "for a while, maybe once a year". Pt was placed on this med 2-3 years ago. Explained to pt that if this occurred again,. She needed to be seen by per PCP or in the ED, and she should tell her PCP this is occuring to determine if medication needs to be switched.

## 2014-03-23 NOTE — Telephone Encounter (Signed)
Pt. Called stating that she had to go to the ER this morning for swelling of lip. Pt. States that the hospital advised pt. To let doctor know. Pt. Has other concerns she would like to discuss with nurse. Please f/u with pt.

## 2014-03-24 ENCOUNTER — Inpatient Hospital Stay (HOSPITAL_COMMUNITY)
Admission: EM | Admit: 2014-03-24 | Discharge: 2014-03-27 | DRG: 392 | Disposition: A | Discharge status: Home / Self Care | Payer: MEDICAID | Attending: Internal Medicine | Admitting: Internal Medicine

## 2014-03-24 ENCOUNTER — Emergency Department (HOSPITAL_COMMUNITY): Payer: MEDICAID

## 2014-03-24 ENCOUNTER — Inpatient Hospital Stay (HOSPITAL_COMMUNITY): Payer: MEDICAID

## 2014-03-24 ENCOUNTER — Encounter (HOSPITAL_COMMUNITY): Payer: Self-pay | Admitting: Emergency Medicine

## 2014-03-24 DIAGNOSIS — E119 Type 2 diabetes mellitus without complications: Secondary | ICD-10-CM | POA: Diagnosis present

## 2014-03-24 DIAGNOSIS — E785 Hyperlipidemia, unspecified: Secondary | ICD-10-CM | POA: Diagnosis present

## 2014-03-24 DIAGNOSIS — K219 Gastro-esophageal reflux disease without esophagitis: Secondary | ICD-10-CM | POA: Diagnosis present

## 2014-03-24 DIAGNOSIS — K529 Noninfective gastroenteritis and colitis, unspecified: Secondary | ICD-10-CM | POA: Diagnosis present

## 2014-03-24 DIAGNOSIS — Z6841 Body Mass Index (BMI) 40.0 and over, adult: Secondary | ICD-10-CM

## 2014-03-24 DIAGNOSIS — I10 Essential (primary) hypertension: Secondary | ICD-10-CM | POA: Diagnosis present

## 2014-03-24 DIAGNOSIS — R079 Chest pain, unspecified: Secondary | ICD-10-CM | POA: Diagnosis not present

## 2014-03-24 DIAGNOSIS — Z8249 Family history of ischemic heart disease and other diseases of the circulatory system: Secondary | ICD-10-CM

## 2014-03-24 DIAGNOSIS — D72829 Elevated white blood cell count, unspecified: Secondary | ICD-10-CM | POA: Diagnosis present

## 2014-03-24 DIAGNOSIS — R51 Headache: Secondary | ICD-10-CM

## 2014-03-24 DIAGNOSIS — Z823 Family history of stroke: Secondary | ICD-10-CM

## 2014-03-24 DIAGNOSIS — R7303 Prediabetes: Secondary | ICD-10-CM

## 2014-03-24 DIAGNOSIS — E669 Obesity, unspecified: Secondary | ICD-10-CM | POA: Diagnosis present

## 2014-03-24 DIAGNOSIS — K5289 Other specified noninfective gastroenteritis and colitis: Principal | ICD-10-CM | POA: Diagnosis present

## 2014-03-24 DIAGNOSIS — N611 Abscess of the breast and nipple: Secondary | ICD-10-CM

## 2014-03-24 DIAGNOSIS — E7800 Pure hypercholesterolemia, unspecified: Secondary | ICD-10-CM | POA: Diagnosis present

## 2014-03-24 DIAGNOSIS — B372 Candidiasis of skin and nail: Secondary | ICD-10-CM

## 2014-03-24 DIAGNOSIS — Z833 Family history of diabetes mellitus: Secondary | ICD-10-CM

## 2014-03-24 DIAGNOSIS — R1310 Dysphagia, unspecified: Secondary | ICD-10-CM | POA: Diagnosis present

## 2014-03-24 DIAGNOSIS — D649 Anemia, unspecified: Secondary | ICD-10-CM | POA: Diagnosis present

## 2014-03-24 HISTORY — DX: Noninfective gastroenteritis and colitis, unspecified: K52.9

## 2014-03-24 HISTORY — DX: Chest pain, unspecified: R07.9

## 2014-03-24 LAB — CBC WITH DIFFERENTIAL/PLATELET
BASOS PCT: 0 % (ref 0–1)
Basophils Absolute: 0 10*3/uL (ref 0.0–0.1)
EOS ABS: 0.1 10*3/uL (ref 0.0–0.7)
Eosinophils Relative: 0 % (ref 0–5)
HCT: 38.3 % (ref 36.0–46.0)
Hemoglobin: 13 g/dL (ref 12.0–15.0)
Lymphocytes Relative: 15 % (ref 12–46)
Lymphs Abs: 2 10*3/uL (ref 0.7–4.0)
MCH: 27.5 pg (ref 26.0–34.0)
MCHC: 33.9 g/dL (ref 30.0–36.0)
MCV: 81 fL (ref 78.0–100.0)
Monocytes Absolute: 0.5 10*3/uL (ref 0.1–1.0)
Monocytes Relative: 4 % (ref 3–12)
Neutro Abs: 10.6 10*3/uL — ABNORMAL HIGH (ref 1.7–7.7)
Neutrophils Relative %: 81 % — ABNORMAL HIGH (ref 43–77)
PLATELETS: 386 10*3/uL (ref 150–400)
RBC: 4.73 MIL/uL (ref 3.87–5.11)
RDW: 13.8 % (ref 11.5–15.5)
WBC: 13.2 10*3/uL — ABNORMAL HIGH (ref 4.0–10.5)

## 2014-03-24 LAB — CBC
HCT: 37.5 % (ref 36.0–46.0)
HEMOGLOBIN: 12.2 g/dL (ref 12.0–15.0)
MCH: 26.7 pg (ref 26.0–34.0)
MCHC: 32.5 g/dL (ref 30.0–36.0)
MCV: 82.1 fL (ref 78.0–100.0)
PLATELETS: 381 10*3/uL (ref 150–400)
RBC: 4.57 MIL/uL (ref 3.87–5.11)
RDW: 14.1 % (ref 11.5–15.5)
WBC: 14.2 10*3/uL — ABNORMAL HIGH (ref 4.0–10.5)

## 2014-03-24 LAB — COMPREHENSIVE METABOLIC PANEL
ALBUMIN: 3.6 g/dL (ref 3.5–5.2)
ALBUMIN: 3.2 g/dL — AB (ref 3.5–5.2)
ALK PHOS: 107 U/L (ref 39–117)
ALT: 9 U/L (ref 0–35)
ALT: 8 U/L (ref 0–35)
ANION GAP: 14 (ref 5–15)
AST: 15 U/L (ref 0–37)
AST: 13 U/L (ref 0–37)
Alkaline Phosphatase: 99 U/L (ref 39–117)
Anion gap: 14 (ref 5–15)
BUN: 17 mg/dL (ref 6–23)
BUN: 15 mg/dL (ref 6–23)
CO2: 30 mEq/L (ref 19–32)
CO2: 28 mEq/L (ref 19–32)
CREATININE: 1.02 mg/dL (ref 0.50–1.10)
Calcium: 9.2 mg/dL (ref 8.4–10.5)
Calcium: 8.7 mg/dL (ref 8.4–10.5)
Chloride: 96 mEq/L (ref 96–112)
Chloride: 99 mEq/L (ref 96–112)
Creatinine, Ser: 1.17 mg/dL — ABNORMAL HIGH (ref 0.50–1.10)
GFR calc Af Amer: 56 mL/min — ABNORMAL LOW (ref >90)
GFR calc non Af Amer: 48 mL/min — ABNORMAL LOW (ref >90)
GFR calc non Af Amer: 57 mL/min — ABNORMAL LOW (ref >90)
GFR, EST AFRICAN AMERICAN: 66 mL/min — AB (ref >90)
Glucose, Bld: 145 mg/dL — ABNORMAL HIGH (ref 70–99)
Glucose, Bld: 156 mg/dL — ABNORMAL HIGH (ref 70–99)
Potassium: 3.1 mEq/L — ABNORMAL LOW (ref 3.7–5.3)
Potassium: 3.5 mEq/L — ABNORMAL LOW (ref 3.7–5.3)
Sodium: 140 mEq/L (ref 137–147)
Sodium: 141 mEq/L (ref 137–147)
TOTAL PROTEIN: 8.1 g/dL (ref 6.0–8.3)
TOTAL PROTEIN: 7.2 g/dL (ref 6.0–8.3)
Total Bilirubin: 0.4 mg/dL (ref 0.3–1.2)
Total Bilirubin: 0.3 mg/dL (ref 0.3–1.2)

## 2014-03-24 LAB — GLUCOSE, CAPILLARY
Glucose-Capillary: 181 mg/dL — ABNORMAL HIGH (ref 70–99)
Glucose-Capillary: 126 mg/dL — ABNORMAL HIGH (ref 70–99)
Glucose-Capillary: 128 mg/dL — ABNORMAL HIGH (ref 70–99)
Glucose-Capillary: 105 mg/dL — ABNORMAL HIGH (ref 70–99)

## 2014-03-24 LAB — LIPASE, BLOOD: Lipase: 14 U/L (ref 11–59)

## 2014-03-24 LAB — D-DIMER, QUANTITATIVE: D-Dimer, Quant: >20 ug/mL-FEU — ABNORMAL HIGH (ref 0.00–0.48)

## 2014-03-24 LAB — TROPONIN I

## 2014-03-24 MED ORDER — INSULIN ASPART 100 UNIT/ML ~~LOC~~ SOLN
0.0000 [IU] | Freq: Three times a day (TID) | SUBCUTANEOUS | Status: DC
  Administered 2014-03-24: 1 [IU] via SUBCUTANEOUS
  Administered 2014-03-24: 2 [IU] via SUBCUTANEOUS
  Administered 2014-03-24 – 2014-03-27 (×3): 1 [IU] via SUBCUTANEOUS

## 2014-03-24 MED ORDER — KCL IN DEXTROSE-NACL 40-5-0.9 MEQ/L-%-% IV SOLN
INTRAVENOUS | Status: AC
  Filled 2014-03-24: qty 1000

## 2014-03-24 MED ORDER — MORPHINE SULFATE 4 MG/ML IJ SOLN
4.0000 mg | Freq: Once | INTRAMUSCULAR | Status: AC
  Administered 2014-03-24: 4 mg via INTRAVENOUS
  Filled 2014-03-24: qty 1

## 2014-03-24 MED ORDER — METRONIDAZOLE IN NACL 5-0.79 MG/ML-% IV SOLN
500.0000 mg | Freq: Three times a day (TID) | INTRAVENOUS | Status: DC
  Administered 2014-03-24 – 2014-03-25 (×3): 500 mg via INTRAVENOUS
  Filled 2014-03-24 (×4): qty 100

## 2014-03-24 MED ORDER — ONDANSETRON HCL 4 MG/2ML IJ SOLN
4.0000 mg | Freq: Three times a day (TID) | INTRAMUSCULAR | Status: DC
  Administered 2014-03-24 – 2014-03-26 (×7): 4 mg via INTRAVENOUS
  Filled 2014-03-24 (×5): qty 2

## 2014-03-24 MED ORDER — CIPROFLOXACIN IN D5W 400 MG/200ML IV SOLN
400.0000 mg | Freq: Two times a day (BID) | INTRAVENOUS | Status: DC
  Administered 2014-03-24 – 2014-03-27 (×6): 400 mg via INTRAVENOUS
  Filled 2014-03-24 (×6): qty 200

## 2014-03-24 MED ORDER — HYDROMORPHONE HCL 1 MG/ML IJ SOLN
1.0000 mg | INTRAMUSCULAR | Status: DC | PRN
  Administered 2014-03-24 – 2014-03-25 (×4): 1 mg via INTRAVENOUS
  Filled 2014-03-24 (×4): qty 1

## 2014-03-24 MED ORDER — KCL IN DEXTROSE-NACL 40-5-0.9 MEQ/L-%-% IV SOLN
INTRAVENOUS | Status: DC
  Administered 2014-03-24 – 2014-03-25 (×3): via INTRAVENOUS
  Filled 2014-03-24 (×8): qty 1000

## 2014-03-24 MED ORDER — ONDANSETRON HCL 4 MG/2ML IJ SOLN
4.0000 mg | Freq: Once | INTRAMUSCULAR | Status: AC
  Administered 2014-03-24: 4 mg via INTRAVENOUS
  Filled 2014-03-24: qty 2

## 2014-03-24 MED ORDER — ONDANSETRON HCL 4 MG PO TABS
4.0000 mg | ORAL_TABLET | Freq: Four times a day (QID) | ORAL | Status: DC | PRN
  Filled 2014-03-24 (×3): qty 1

## 2014-03-24 MED ORDER — METOPROLOL TARTRATE 25 MG PO TABS
25.0000 mg | ORAL_TABLET | Freq: Two times a day (BID) | ORAL | Status: DC
  Administered 2014-03-24 – 2014-03-27 (×6): 25 mg via ORAL
  Filled 2014-03-24 (×7): qty 1

## 2014-03-24 MED ORDER — ATORVASTATIN CALCIUM 10 MG PO TABS
10.0000 mg | ORAL_TABLET | Freq: Every day | ORAL | Status: DC
  Administered 2014-03-24 – 2014-03-27 (×4): 10 mg via ORAL
  Filled 2014-03-24 (×4): qty 1

## 2014-03-24 MED ORDER — SODIUM CHLORIDE 0.9 % IJ SOLN
INTRAMUSCULAR | Status: AC
  Administered 2014-03-24: 03:00:00
  Filled 2014-03-24: qty 50

## 2014-03-24 MED ORDER — IOHEXOL 300 MG/ML  SOLN
50.0000 mL | Freq: Once | INTRAMUSCULAR | Status: AC | PRN
  Administered 2014-03-24: 50 mL via ORAL

## 2014-03-24 MED ORDER — FAMOTIDINE IN NACL 20-0.9 MG/50ML-% IV SOLN
20.0000 mg | Freq: Two times a day (BID) | INTRAVENOUS | Status: DC
  Administered 2014-03-24: 20 mg via INTRAVENOUS
  Filled 2014-03-24 (×5): qty 50

## 2014-03-24 MED ORDER — PANTOPRAZOLE SODIUM 40 MG PO TBEC
40.0000 mg | DELAYED_RELEASE_TABLET | Freq: Two times a day (BID) | ORAL | Status: DC
  Administered 2014-03-24 – 2014-03-27 (×7): 40 mg via ORAL
  Filled 2014-03-24 (×7): qty 1

## 2014-03-24 MED ORDER — IOHEXOL 300 MG/ML  SOLN
100.0000 mL | Freq: Once | INTRAMUSCULAR | Status: AC | PRN
  Administered 2014-03-24: 100 mL via INTRAVENOUS

## 2014-03-24 MED ORDER — ONDANSETRON HCL 4 MG/2ML IJ SOLN
4.0000 mg | Freq: Four times a day (QID) | INTRAMUSCULAR | Status: DC | PRN
  Administered 2014-03-24 – 2014-03-27 (×4): 4 mg via INTRAVENOUS
  Filled 2014-03-24 (×7): qty 2

## 2014-03-24 MED ORDER — SODIUM CHLORIDE 0.9 % IV BOLUS (SEPSIS)
1000.0000 mL | Freq: Once | INTRAVENOUS | Status: AC
  Administered 2014-03-24: 1000 mL via INTRAVENOUS

## 2014-03-24 NOTE — Progress Notes (Signed)
Pt denies nausea and pain at this time.  Pt states she feels hungry.  Tray provided.  Pt states she feels improved and resting comfortably.  Bed in lowest position.  Call bell and phone in reach.  Will continue to monitor.

## 2014-03-24 NOTE — Progress Notes (Signed)
UR completed 

## 2014-03-24 NOTE — Progress Notes (Signed)
Pt stated she was having pain mid sternum and upper mid back, sharp and stabbing for 30 mins.  Pt stated she had intermittant pains at home that were similar, but this pain was much worse.  Pt stated pain 7/10 on pain scale.  Dr Caryn Section notified, orders received and implemented.  Dilaudid 1mg  given IVP for pain.  Will continue to monitor.

## 2014-03-24 NOTE — Consult Note (Addendum)
Referring Provider: No ref. provider found Primary Care Physician:  Minerva Ends, MD Primary Gastroenterologist:  Barney Drain  Reason for Consultation:  ABDOMINAL PAIN   Impression: ADMITTED WITH IELITIS ASSOCIATED WITH AN ACUTE ONSET OF ABDOMINAL PAIN AND NAUSEA MOST LIKEY DUE TO INFECTION, LESS LIKELY CROHN'S DISEASE, LYMPHOMA,  OR NSAID ENTEROPATHY. PT HAS Hx;GERD AND SX UNCONTROLLED ON RANITIDINE.   Plan: 1. CONTINUE ABX 2. IF NO CLINICAL IMPROVEMENT PT WILL NEED RETROGRADE DBE. 3. ZOFRAN ATC AND HS 4. D/C PEPCID.  BID PPI 5. Givens capsule likely not beneficial in establishing a diagnosis at this point. IF PT CLINICALLY IMPROVED WOULD CONSIDER REPEAT CT A/P OR CT ENTEROGRAPHY IN 4-6 WEEKS.   HPI:  HAD ILEITIS 2012-. SX LASTED 1 WEEK ABUT RESOLVED. ALL OF A SUDDEN HAD PAIN IN [place]LLY AND SEVER NAUSEA AT 8 PM LAST NIGHT. WATCHING TV WHEN IT STARTED. NO VOMITING. MAY HAVE CONSTIPATION. PAIN ASSOCIATED WITH ONE LOOSE STOOL. PAIN IN LEFT SIDE AND AT THE TOP. ATE CHICKEN YESTERDAY FROM HOME. MAY GET STRANGLED ON WATER AND SOLID FOOD-2X/WEEK. HEARTBURN: TAKING MEDS FOR REFLUX. HELPS A LITTLE. SQUEEZING CHEST PAIN AND NOW IT'S GONE.  PT DENIES FEVER, CHILLS, HEMATOCHEZIA,  heartburn or indigestion, HEMATEMESIS, nomiting, melena, OR SHORTNESS OF BREATH   LIMITED HISTORY PT FALLING ASLEEP DURING HISTORY BECAUSE SHE JUST RECEIVED PAIN MEDS.  Past Medical History  Diagnosis Date  . Diabetes mellitus 2012    borderline diabetes, never on medicine   . Acid reflux 2000    HA with nexium   . Enteritis 04/23/2011  . Hypercholesteremia 2011  . Hypertension 1990s     Past Surgical History  Procedure Laterality Date  . Abdominal hysterectomy  1983   . Incontinence surgery  1990   . Tubal ligation  1983     Prior to Admission medications   Medication Sig Start Date End Date Taking? Authorizing Provider  aspirin EC 81 MG tablet Take 81 mg by mouth daily.   Yes Historical Provider,  MD  atorvastatin (LIPITOR) 10 MG tablet Take 1 tablet (10 mg total) by mouth daily. 03/02/14  Yes Josalyn C Funches, MD  metoprolol tartrate (LOPRESSOR) 25 MG tablet Take 1 tablet (25 mg total) by mouth 2 (two) times daily. 03/15/14  Yes Josalyn C Funches, MD  OVER THE COUNTER MEDICATION Take 1 tablet by mouth daily as needed (allergies). OTC Allergy medication   Yes Historical Provider, MD  ranitidine (ZANTAC) 150 MG tablet Take 1 tablet (150 mg total) by mouth 2 (two) times daily. 02/22/14  Yes Orlie Dakin, MD  hydrochlorothiazide (HYDRODIURIL) 25 MG tablet Take 25 mg by mouth daily.    Historical Provider, MD  sulfamethoxazole-trimethoprim (SEPTRA DS) 800-160 MG per tablet Take 1 tablet by mouth 2 (two) times daily. 03/23/14   Nat Christen, MD    Current Facility-Administered Medications  Medication Dose Route Frequency Provider Last Rate Last Dose  . atorvastatin (LIPITOR) tablet 10 mg  10 mg Oral QPC supper Orvan Falconer, MD      . dextrose 5 % and 0.9 % NaCl with KCl 40 mEq/L infusion   Intravenous Continuous Orvan Falconer, MD 125 mL/hr at 03/24/14 [phone]    . PEPCID       20 mg at 03/24/14 0924  . HYDROmorphone (DILAUDID) injection 1 mg  1 mg Intravenous Q3H PRN Orvan Falconer, MD   1 mg at 03/24/14 0457  . insulin aspart (novoLOG) injection 0-9 Units  0-9 Units Subcutaneous TID WC Rexene Alberts, MD  2 Units at 03/24/14 0916  . metoprolol tartrate (LOPRESSOR) tablet 25 mg  25 mg Oral BID Orvan Falconer, MD      .              .        4 mg at 03/24/14 0600    Allergies as of 03/24/2014 - Review Complete 03/24/2014  Allergen Reaction Noted  . Lisinopril Swelling 03/24/2014  . Penicillins Hives and Itching 04/23/2011    Family History  Problem Relation Age of Onset  . Diabetes Mother   . Heart disease Mother   . Stroke Mother   . Diabetes Maternal Aunt   . Cancer Maternal Aunt 60    stomach   . Diabetes Maternal Uncle   . Cancer Daughter 18    breast    History   Social History  . Marital  Status: Divorced    Spouse Name: N/A    Number of Children: 3   . Years of Education: some colle   Occupational History  . Retired      Former Quarry manager    Social History Main Topics  . Smoking status: Never Smoker   . Smokeless tobacco: Never Used  . Alcohol Use: No  . Drug Use: No  . Sexual Activity: Not Currently    Birth Control/ Protection: Surgical   Other Topics Concern  . Not on file   Social History Narrative   3 children   2 living, sons   Lives alone.    7 grandchildren.     Review of Systems: ON PREDNISONE FOR 6 DAYS AUG 2015. PER HPI OTHERWISE ALL SYSTEMS ARE NEGATIVE.  Vitals: Blood pressure 130/60, pulse 55, temperature 98.1 F (36.7 C), temperature source Oral, resp. rate 18, height 5\' 3"  (1.6 m), weight 227 lb 8.2 oz (103.2 kg), SpO2 95.00%.  Physical Exam: General:   AROUSABLE AND cooperative in NAD, SEDATED Head:  Normocephalic and atraumatic. Eyes:  Sclera clear, no icterus.   Conjunctiva pink. Mouth:  No lesions, dentition ABnormal. Neck:  Supple; no masses. Lungs:  Clear throughout to auscultation.   No wheezes. No acute distress. Heart:  Regular rate and rhythm; no murmurs, clicks, rubs,  or gallops. Abdomen:  Soft, nontender and nondistended. No masses noted. OBESE. Normal bowel sounds, without guarding, and without rebound.   Msk:  Symmetrical  Extremities:  Without edema. Neurologic:  Alert and  oriented x4;  grossly normal neurologically. Cervical Nodes:  No significant cervical adenopathy. Psych:  UNABLE TO ASSESS PT SEDATED  Lab Results:  Recent Labs  03/24/14 0254 03/24/14 0612  WBC 13.2* 14.2*  HGB 13.0 12.2  HCT 38.3 37.5  PLT 386 381   BMET  Recent Labs  03/24/14 0254 03/24/14 0612  NA 140 141  K 3.1* 3.5*  CL 96 99  CO2 30 28  GLUCOSE 145* 156*  BUN 17 15  CREATININE 1.17* 1.02  CALCIUM 9.2 8.7   LFT  Recent Labs  03/24/14 0612  PROT 7.2  ALBUMIN 3.2*  AST 13  ALT 8  ALKPHOS 99  BILITOT 0.3     Studies/Results: I PERSONALLY REVIEWED CT WITH DR. Andee Poles FINDING IN  PROXIMAL ILEUM SEEN IN 2012. BOWEL WALL THICKENING SIGNIFICANT DISTANT FROM IC VALVE.   LOS: 0 days   Brahm Barbeau  03/24/2014, 11:59 AM

## 2014-03-24 NOTE — Progress Notes (Signed)
The patient is a 62 year old woman with a history of diabetes mellitus, GERD, headaches, and enteritis in 2012, who was admitted this morning by Dr. Marin Comment for nausea, vomiting, and epigastric pain. She was briefly seen and examined. Her chart, vitals signs, and laboratory studies were reviewed. She is still symptomatic. I believe she would benefit from Cipro and Flagyl for presumed ileitis. These will be started. She denies diarrhea, but stool studies and C. difficile PCR are pending. We'll continue potassium chloride repletion in the IV fluids. We'll start sliding scale NovoLog. Consider discontinuing dextrose in the IV fluids if her blood glucose becomes uncontrolled. Her hemoglobin A1c was 6.1 in August 2015 in the outpatient setting. Gastroenterology has been consulted. We'll await the assessment.

## 2014-03-24 NOTE — H&P (Signed)
[facility] History and Physical  Sandra [name] VZD:[id number] DOB: [birth year]    PCP:   Minerva Ends, MD   Chief Complaint:  Epigastric pain, nausea and vomiting.  HPI: Sandra [name] is an 62 y.o. female with hx of obesity, DM2 diet controlled, GERD, HTN, hyperlipidemia, was seen in the ER yesterday for mouth swelling, given Bactrim which she took 2 tablets, came to the ER with epigastric pain, nausea and vomiting with soft stool for one day.  She lives alone and denied any ill contact.  Of note, she had similar epigastric pain and was admitted in 2012 and was diagnosed with enteritis, though she never did have any upper or lower endoscopy.  She did not see any gastroenterologist.  Evalution in the ER included a Cr 1.17, K of 3.1, and WBC of 13K.  She has normal liver function tests and normal lipase.  An abdominal pelvic CT showed diffuse proximal ileitis with no abscesses and no obstruction.  Hospitalist was asked to admit her for further evaluation and treatment.   Rewiew of Systems:  Constitutional: Negative for malaise, fever and chills. No significant weight loss or weight gain Eyes: Negative for eye pain, redness and discharge, diplopia, visual changes, or flashes of light. ENMT: Negative for ear pain, hoarseness, nasal congestion, sinus pressure and sore throat. No headaches; tinnitus, drooling, or problem swallowing. Cardiovascular: Negative for chest pain, palpitations, diaphoresis, dyspnea and peripheral edema. ; No orthopnea, PND Respiratory: Negative for cough, hemoptysis, wheezing and stridor. No pleuritic chestpain. Gastrointestinal: Negative for constipation, abdominal pain, melena, blood in stool, hematemesis, jaundice and rectal bleeding.    Genitourinary: Negative for frequency, dysuria, incontinence,flank pain and hematuria; Musculoskeletal: Negative for back pain and neck pain. Negative for swelling and trauma.;  Skin: . Negative for pruritus, rash,  abrasions, bruising and skin lesion.; ulcerations Neuro: Negative for headache, lightheadedness and neck stiffness. Negative for weakness, altered level of consciousness , altered mental status, extremity weakness, burning feet, involuntary movement, seizure and syncope.  Psych: negative for anxiety, depression, insomnia, tearfulness, panic attacks, hallucinations, paranoia, suicidal or homicidal ideation    Past Medical History  Diagnosis Date  . Diabetes mellitus 2012    borderline diabetes, never on medicine   . Acid reflux 2000    HA with nexium   . Enteritis 04/23/2011  . Hypercholesteremia 2011  . Hypertension 1990s     Past Surgical History  Procedure Laterality Date  . Abdominal hysterectomy  1983   . Incontinence surgery  1990   . Tubal ligation  1983     Medications:  HOME MEDS: Prior to Admission medications   Medication Sig Start Date End Date Taking? Authorizing Provider  atorvastatin (LIPITOR) 10 MG tablet Take 1 tablet (10 mg total) by mouth daily. 03/02/14   Lennox Laity Funches, MD  hydrochlorothiazide (HYDRODIURIL) 25 MG tablet Take 1 tablet (25 mg total) by mouth daily. 03/23/14   Minerva Ends, MD  metoprolol tartrate (LOPRESSOR) 25 MG tablet Take 1 tablet (25 mg total) by mouth 2 (two) times daily. 03/15/14   Josalyn C Funches, MD  ranitidine (ZANTAC) 150 MG tablet Take 1 tablet (150 mg total) by mouth 2 (two) times daily. 02/22/14   Orlie Dakin, MD  sulfamethoxazole-trimethoprim (SEPTRA DS) 800-160 MG per tablet Take 1 tablet by mouth 2 (two) times daily. 03/23/14   Nat Christen, MD     Allergies:  Allergies  Allergen Reactions  . Penicillins Hives and Itching    Social History:  reports that she has never smoked. She has never used smokeless tobacco. She reports that she does not drink alcohol or use illicit drugs.  Family History: Family History  Problem Relation Age of Onset  . Diabetes Mother   . Heart disease Mother   . Stroke Mother   .  Diabetes Maternal Aunt   . Cancer Maternal Aunt 60    stomach   . Diabetes Maternal Uncle   . Cancer Daughter 16    breast      Physical Exam: Filed Vitals:   03/24/14 0148  BP: 169/103  Pulse: 95  Temp: 99.4 F (37.4 C)  TempSrc: Oral  Resp: 20  Height: 5\' 3"  (1.6 m)  Weight: 102.513 kg (226 lb)  SpO2: 95%   Blood pressure 169/103, pulse 95, temperature 99.4 F (37.4 C), temperature source Oral, resp. rate 20, height 5\' 3"  (1.6 m), weight 102.513 kg (226 lb), SpO2 95.00%.  GEN:  Pleasant patient lying in the stretcher in no acute distress; cooperative with exam. PSYCH:  alert and oriented x4; does not appear anxious or depressed; affect is appropriate. HEENT: Mucous membranes pink and anicteric; PERRLA; EOM intact; no cervical lymphadenopathy nor thyromegaly or carotid bruit; no JVD; There were no stridor. Neck is very supple. Breasts:: Not examined CHEST WALL: No tenderness CHEST: Normal respiration, clear to auscultation bilaterally.  HEART: Regular rate and rhythm.  There are no murmur, rub, or gallops.   BACK: No kyphosis or scoliosis; no CVA tenderness ABDOMEN: soft and tender in the epigastrium, no masses, no organomegaly, normal abdominal bowel sounds; no pannus; no intertriginous candida. There is no rebound and no distention. Rectal Exam: Not done EXTREMITIES: No bone or joint deformity; age-appropriate arthropathy of the hands and knees; no edema; no ulcerations.  There is no calf tenderness. Genitalia: not examined PULSES: 2+ and symmetric SKIN: Normal hydration no rash or ulceration CNS: Cranial nerves 2-12 grossly intact no focal lateralizing neurologic deficit.  Speech is fluent; uvula elevated with phonation, facial symmetry and tongue midline. DTR are normal bilaterally, cerebella exam is intact, barbinski is negative and strengths are equaled bilaterally.  No sensory loss.   Labs on Admission:  Basic Metabolic Panel:  Recent Labs Lab 03/24/14 0254  NA  140  K 3.1*  CL 96  CO2 30  GLUCOSE 145*  BUN 17  CREATININE 1.17*  CALCIUM 9.2   Liver Function Tests:  Recent Labs Lab 03/24/14 0254  AST 15  ALT 9  ALKPHOS 107  BILITOT 0.4  PROT 8.1  ALBUMIN 3.6    Recent Labs Lab 03/24/14 0254  LIPASE 14   No results found for this basename: AMMONIA,  in the last 168 hours CBC:  Recent Labs Lab 03/24/14 0254  WBC 13.2*  NEUTROABS 10.6*  HGB 13.0  HCT 38.3  MCV 81.0  PLT 386    Radiological Exams on Admission: Ct Abdomen Pelvis W Contrast  03/24/2014   CLINICAL DATA:  Severe left upper quadrant abdominal pain for 1 day:  EXAM: CT ABDOMEN AND PELVIS WITH CONTRAST  TECHNIQUE: Multidetector CT imaging of the abdomen and pelvis was performed using the standard protocol following bolus administration of intravenous contrast.  CONTRAST:  107mL OMNIPAQUE IOHEXOL 300 MG/ML SOLN, 127mL OMNIPAQUE IOHEXOL 300 MG/ML SOLN  COMPARISON:  Lumbar spine radiographs performed 08/24/2013, and CT of the abdomen and pelvis performed 04/23/2011  FINDINGS: The visualized lung bases are clear.  The liver and spleen are unremarkable in appearance. The gallbladder is within normal  limits. The pancreas and adrenal glands are unremarkable.  Prominent left renal cysts are seen, the largest of which measures 6.8 cm in size. The right kidney is unremarkable in appearance. There is no evidence of hydronephrosis. No renal or ureteral stones are seen. No perinephric stranding is appreciated.  There is diffuse wall thickening involving a long segment of proximal ileum, with associated soft tissue inflammation and trace free fluid, concerning for infectious or inflammatory process. There is no evidence of ischemia. A small amount of associated free fluid is seen within the pelvis.  The stomach is within normal limits. No acute vascular abnormalities are seen.  The appendix is decompressed and not well assessed. No evidence for appendicitis.  The colon is relatively  decompressed and unremarkable in appearance.  The bladder is mildly distended and grossly unremarkable in appearance. The patient is status post hysterectomy. No suspicious adnexal masses are seen. The ovaries appear relatively symmetric. No inguinal lymphadenopathy is seen.  No acute osseous abnormalities are identified. Facet disease is noted at the lower lumbar spine.  IMPRESSION: 1. Diffuse proximal ileal wall thickening noted, with associated soft tissue inflammation and trace free fluid, concerning for an infectious or inflammatory ileitis. No evidence of ischemia. The distal small bowel is unremarkable. Small amount of associated free fluid seen within the pelvis. 2. Left renal cysts noted.   Electronically Signed   By: Garald Balding M.D.   On: 03/24/2014 03:33    Assessment/Plan Present on Admission:  . Ileitis . HTN (hypertension) . Hypercholesteremia . Obesity . Acid reflux  PLAN:  This patient had similar episode of epigastric pain and diarrhea in 2012, given Dx of enteritis, now return with pain in her epigastric area, with CT showing proximal ileitis.  She had some nausea and vomiting after taking her Bactrim, which could also be the cause.  Will admit her for pain control and bowel rest.  She also has low K,and will be supplemented in the IVF.  Will give her some IV Dialudid for the pain.  She will need endoscopy at some point, so I have consulted GI.  I don't think she has C diff, but I have ordered stool sent for culture and C diff.  For her HTN, will hold her diuretics, and continue her BP meds.  Will give her IV PPI for hx of GERD.  She is stable, full code, and will be admitted to hospitalist service.  Thank you for asking me to participate in her care.   Other plans as per orders.  Code Status: FULL Haskel Khan, MD. [facility] Pager [phone] 7pm to 7am.  03/24/2014, 4:05 AM

## 2014-03-24 NOTE — Progress Notes (Signed)
Pt stated she had an appt. With [facility] on 04/02/14 for "biopsy of temple vein"  Due to ed admission at [facility] for "blacking out x 3 times with headaches"  Pt stated number to call is [phone].

## 2014-03-24 NOTE — ED Provider Notes (Signed)
CSN: [id number]     Arrival date & time 03/24/14  0141 History   First MD Initiated Contact with Patient 03/24/14 0224     Chief Complaint  Patient presents with  . Abdominal Pain     (Consider location/radiation/quality/duration/timing/severity/associated sxs/prior Treatment) HPI Comments: Patient is a 63 year old female with history of diabetes, hypertension, high cholesterol. She presents today with complaints of severe upper abdominal pain. This started shortly after taking Bactrim. This was prescribed earlier today for some sort of oral infection. She is felt nauseated and is vomiting. She denies any diarrhea. She denies any prior abdominal surgery with the exception of a partial hysterectomy.  Patient is a 63 y.o. female presenting with abdominal pain. The history is provided by the patient.  Abdominal Pain Pain location:  Epigastric Pain quality: cramping   Pain radiates to:  Does not radiate Pain severity:  Severe Onset quality:  Sudden Duration:  3 hours Timing:  Constant Progression:  Worsening Chronicity:  New Relieved by:  Nothing Worsened by:  Nothing tried Ineffective treatments:  None tried   Past Medical History  Diagnosis Date  . Diabetes mellitus 2012    borderline diabetes, never on medicine   . Acid reflux 2000    HA with nexium   . Enteritis 04/23/2011  . Hypercholesteremia 2011  . Hypertension 1990s    Past Surgical History  Procedure Laterality Date  . Abdominal hysterectomy  1983   . Incontinence surgery  1990   . Tubal ligation  1983    Family History  Problem Relation Age of Onset  . Diabetes Mother   . Heart disease Mother   . Stroke Mother   . Diabetes Maternal Aunt   . Cancer Maternal Aunt 60    stomach   . Diabetes Maternal Uncle   . Cancer Daughter 85    breast    History  Substance Use Topics  . Smoking status: Never Smoker   . Smokeless tobacco: Never Used  . Alcohol Use: No   OB History   Grav Para Term Preterm Abortions  TAB SAB Ect Mult Living                 Review of Systems  Gastrointestinal: Positive for abdominal pain.  All other systems reviewed and are negative.     Allergies  Penicillins  Home Medications   Prior to Admission medications   Medication Sig Start Date End Date Taking? Authorizing Provider  atorvastatin (LIPITOR) 10 MG tablet Take 1 tablet (10 mg total) by mouth daily. 03/02/14   Lennox Laity Funches, MD  hydrochlorothiazide (HYDRODIURIL) 25 MG tablet Take 1 tablet (25 mg total) by mouth daily. 03/23/14   Minerva Ends, MD  metoprolol tartrate (LOPRESSOR) 25 MG tablet Take 1 tablet (25 mg total) by mouth 2 (two) times daily. 03/15/14   Josalyn C Funches, MD  ranitidine (ZANTAC) 150 MG tablet Take 1 tablet (150 mg total) by mouth 2 (two) times daily. 02/22/14   Orlie Dakin, MD  sulfamethoxazole-trimethoprim (SEPTRA DS) 800-160 MG per tablet Take 1 tablet by mouth 2 (two) times daily. 03/23/14   Nat Christen, MD   BP 169/103  Pulse 95  Temp(Src) 99.4 F (37.4 C) (Oral)  Resp 20  Ht 5\' 3"  (1.6 m)  Wt 226 lb (102.513 kg)  BMI 40.04 kg/m2  SpO2 95% Physical Exam  Nursing note and vitals reviewed. Constitutional: She is oriented to person, place, and time. She appears well-developed and well-nourished. No distress.  HENT:  Head: Normocephalic and atraumatic.  Neck: Normal range of motion. Neck supple.  Cardiovascular: Normal rate and regular rhythm.  Exam reveals no gallop and no friction rub.   No murmur heard. Pulmonary/Chest: Effort normal and breath sounds normal. No respiratory distress. She has no wheezes.  Abdominal: Soft. Bowel sounds are normal. She exhibits no distension. There is tenderness. There is no rebound and no guarding.  There is tenderness to palpation in the epigastrium and left upper quadrant.  Musculoskeletal: Normal range of motion.  Neurological: She is alert and oriented to person, place, and time.  Skin: Skin is warm and dry. She is not  diaphoretic.    ED Course  Procedures (including critical care time) Labs Review Labs Reviewed  CBC WITH DIFFERENTIAL  COMPREHENSIVE METABOLIC PANEL  LIPASE, BLOOD    Imaging Review No results found.   EKG Interpretation None      MDM   Final diagnoses:  None    Patient presents with complaints of upper nominal pain, nausea, and vomiting that started shortly after taking a dose of Bactrim. Lab work reveals an elevated white count and CT scan reveals ileitis. She is given normal saline, and medications for pain and nausea and is feeling somewhat better. She still is quite uncomfortable and I feel warrants admission. In speaking with Dr. Marin Comment who agrees to admit.    Veryl Speak, MD 03/24/14 [phone]

## 2014-03-24 NOTE — ED Notes (Signed)
Pt was here earlier and was given an antibiotic. Pt c/o abdominal pain and nausea after taking 2 tablets.

## 2014-03-25 ENCOUNTER — Inpatient Hospital Stay (HOSPITAL_COMMUNITY): Payer: MEDICAID

## 2014-03-25 DIAGNOSIS — D649 Anemia, unspecified: Secondary | ICD-10-CM

## 2014-03-25 DIAGNOSIS — R079 Chest pain, unspecified: Secondary | ICD-10-CM

## 2014-03-25 DIAGNOSIS — R7309 Other abnormal glucose: Secondary | ICD-10-CM

## 2014-03-25 HISTORY — DX: Chest pain, unspecified: R07.9

## 2014-03-25 LAB — COMPREHENSIVE METABOLIC PANEL
ALT: 7 U/L (ref 0–35)
AST: 12 U/L (ref 0–37)
Albumin: 2.8 g/dL — ABNORMAL LOW (ref 3.5–5.2)
Alkaline Phosphatase: 83 U/L (ref 39–117)
Anion gap: 9 (ref 5–15)
BUN: 9 mg/dL (ref 6–23)
CO2: 27 mEq/L (ref 19–32)
CREATININE: 0.96 mg/dL (ref 0.50–1.10)
Calcium: 8.6 mg/dL (ref 8.4–10.5)
Chloride: 104 mEq/L (ref 96–112)
GFR calc non Af Amer: 61 mL/min — ABNORMAL LOW (ref >90)
GFR, EST AFRICAN AMERICAN: 71 mL/min — AB (ref >90)
Glucose, Bld: 123 mg/dL — ABNORMAL HIGH (ref 70–99)
Potassium: 4.2 mEq/L (ref 3.7–5.3)
SODIUM: 140 meq/L (ref 137–147)
TOTAL PROTEIN: 6.7 g/dL (ref 6.0–8.3)
Total Bilirubin: 0.3 mg/dL (ref 0.3–1.2)

## 2014-03-25 LAB — CBC
HCT: 32.6 % — ABNORMAL LOW (ref 36.0–46.0)
HEMOGLOBIN: 10.4 g/dL — AB (ref 12.0–15.0)
MCH: 26.7 pg (ref 26.0–34.0)
MCHC: 31.9 g/dL (ref 30.0–36.0)
MCV: 83.8 fL (ref 78.0–100.0)
PLATELETS: 359 10*3/uL (ref 150–400)
RBC: 3.89 MIL/uL (ref 3.87–5.11)
RDW: 14.3 % (ref 11.5–15.5)
WBC: 7.4 10*3/uL (ref 4.0–10.5)

## 2014-03-25 LAB — TROPONIN I

## 2014-03-25 LAB — TSH: TSH: 0.901 u[IU]/mL (ref 0.350–4.500)

## 2014-03-25 LAB — GLUCOSE, CAPILLARY
GLUCOSE-CAPILLARY: 141 mg/dL — AB (ref 70–99)
GLUCOSE-CAPILLARY: 98 mg/dL (ref 70–99)
Glucose-Capillary: 98 mg/dL (ref 70–99)
Glucose-Capillary: 99 mg/dL (ref 70–99)

## 2014-03-25 MED ORDER — IOHEXOL 350 MG/ML SOLN
100.0000 mL | Freq: Once | INTRAVENOUS | Status: AC | PRN
  Administered 2014-03-25: 100 mL via INTRAVENOUS

## 2014-03-25 MED ORDER — HYDROMORPHONE HCL 1 MG/ML PO LIQD: 0.5000 mg | Freq: Three times a day (TID) | ORAL | Status: DC

## 2014-03-25 MED ORDER — HYDROMORPHONE HCL 1 MG/ML IJ SOLN
0.5000 mg | Freq: Three times a day (TID) | INTRAMUSCULAR | Status: DC
  Administered 2014-03-25 – 2014-03-26 (×3): 0.5 mg via INTRAVENOUS
  Filled 2014-03-25 (×3): qty 1

## 2014-03-25 MED ORDER — ENOXAPARIN SODIUM 40 MG/0.4ML ~~LOC~~ SOLN
40.0000 mg | SUBCUTANEOUS | Status: DC
  Administered 2014-03-26 – 2014-03-27 (×2): 40 mg via SUBCUTANEOUS
  Filled 2014-03-25 (×2): qty 0.4

## 2014-03-25 MED ORDER — POTASSIUM CHLORIDE IN NACL 20-0.9 MEQ/L-% IV SOLN
INTRAVENOUS | Status: DC
  Administered 2014-03-25 – 2014-03-27 (×3): via INTRAVENOUS

## 2014-03-25 MED ORDER — ENOXAPARIN SODIUM 100 MG/ML ~~LOC~~ SOLN
100.0000 mg | Freq: Two times a day (BID) | SUBCUTANEOUS | Status: DC
  Administered 2014-03-25: 100 mg via SUBCUTANEOUS
  Filled 2014-03-25: qty 1

## 2014-03-25 NOTE — Progress Notes (Signed)
Pt states she ate the potatoes in her dinner and denied pain after eating.  Will continue to monitor.

## 2014-03-25 NOTE — Progress Notes (Signed)
Patient ID: Sandra [name], female   DOB: 1951/03/15, 63 y.o.   MRN: [id number]   Assessment/Plan: Admitted with  ACUTE ONSET OF abdominal pain, nausea, and vomiting DUE TO ILEITIS. Clinically improved, BUT VOMITING PERSISTS, BUT NAUSEA BETTER. VOMITING MAY BE DUE TO FLAGYL. PAIN IDEALLY CONTROLLED.  PLAN: 1. CONTINUE FULL LIQUIDS. ADVANCE SEP 21. 2. CONTINUE TO MONITOR SYMPTOMS. 3. D/C FLAGYL 4. DILAUDID 0.5 MG TID ATC ND PRN 5. CONTINUE ZOFRAN ATC.   Subjective: Since I last evaluated the patient SHE VOMITED TWICE(NO BLOOD), MLD NAUSEA. PAIN BETTER BUT NEVER GOES AWAY. PASSING GAS. NO BM.  Objective: Vital signs in last 24 hours: Filed Vitals:   03/25/14 2220  BP: 145/51  Pulse: 58  Temp: 98.1 F (36.7 C)  Resp: 18   General appearance: alert Resp: clear to auscultation bilaterally Cardio: regular rate and rhythm GI: soft, MILD TTP IN THE EPIGASTRIUM ; bowel sounds normal; NO REBOUND OR GUARDING Lab Results:   Studies/Results: Ct Abdomen Pelvis W Contrast  03/24/2014   CLINICAL DATA:  Severe left upper quadrant abdominal pain for 1 day:  EXAM: CT ABDOMEN AND PELVIS WITH CONTRAST  TECHNIQUE: Multidetector CT imaging of the abdomen and pelvis was performed using the standard protocol following bolus administration of intravenous contrast.  CONTRAST:  37mL OMNIPAQUE IOHEXOL 300 MG/ML SOLN, 174mL OMNIPAQUE IOHEXOL 300 MG/ML SOLN  COMPARISON:  Lumbar spine radiographs performed 08/24/2013, and CT of the abdomen and pelvis performed 04/23/2011  FINDINGS: The visualized lung bases are clear.  The liver and spleen are unremarkable in appearance. The gallbladder is within normal limits. The pancreas and adrenal glands are unremarkable.  Prominent left renal cysts are seen, the largest of which measures 6.8 cm in size. The right kidney is unremarkable in appearance. There is no evidence of hydronephrosis. No renal or ureteral stones are seen. No perinephric stranding is appreciated.   There is diffuse wall thickening involving a long segment of proximal ileum, with associated soft tissue inflammation and trace free fluid, concerning for infectious or inflammatory process. There is no evidence of ischemia. A small amount of associated free fluid is seen within the pelvis.  The stomach is within normal limits. No acute vascular abnormalities are seen.  The appendix is decompressed and not well assessed. No evidence for appendicitis.  The colon is relatively decompressed and unremarkable in appearance.  The bladder is mildly distended and grossly unremarkable in appearance. The patient is status post hysterectomy. No suspicious adnexal masses are seen. The ovaries appear relatively symmetric. No inguinal lymphadenopathy is seen.  No acute osseous abnormalities are identified. Facet disease is noted at the lower lumbar spine.  IMPRESSION: 1. Diffuse proximal ileal wall thickening noted, with associated soft tissue inflammation and trace free fluid, concerning for an infectious or inflammatory ileitis. No evidence of ischemia. The distal small bowel is unremarkable. Small amount of associated free fluid seen within the pelvis. 2. Left renal cysts noted.   Electronically Signed   By: Garald Balding M.D.   On: 03/24/2014 03:33   Dg Chest Port 1 View  03/24/2014   CLINICAL DATA:  Chest pain.  Abdominal pain.  Nausea and vomiting.  EXAM: PORTABLE CHEST - 1 VIEW  COMPARISON:  None.  FINDINGS: Cardiac silhouette upper normal in size to slightly enlarged for AP portable technique. Suboptimal inspiration due to body habitus accounts for crowded bronchovascular markings diffusely. Taking this into account, lungs clear. No pleural effusions.  IMPRESSION: Borderline to mild cardiomegaly. Suboptimal inspiration. No acute  cardiopulmonary disease.   Electronically Signed   By: Evangeline Dakin M.D.   On: 03/24/2014 13:51    Medications: I have reviewed the patient's current medications.   LOS: 5 days    Barney Drain 12/14/2013, 2:23 PM

## 2014-03-25 NOTE — Progress Notes (Signed)
[facility] PROGRESS NOTE  Tenesia Escudero Cruey MWU:[id number] DOB: Sep 12, 1950 DOA: 03/24/2014 PCP: Minerva Ends, MD    Code Status: Full code Family Communication: Family available Disposition Plan: Discharge to home when clinically improved   Consultants:  Gastroenterology, Dr. Oneida Alar  Procedures:  None  Antibiotics:  Cipro 9/19>>  Metronidazole 9/19>>  HPI/Subjective: The patient says that her abdominal pain is less than it was yesterday. She has slightly increased pain after eating. She denies nausea or vomiting. She has not had a bowel movement since admission. She complained of chest pain yesterday. It has resolved for now. She has a history of recurrent chest pain which she attributes to acid reflux.  Objective: Filed Vitals:   03/25/14 0655  BP: 132/57  Pulse: 70  Temp: 98.3 F (36.8 C)  Resp: 18   oxygen saturation 98% on room air  Intake/Output Summary (Last 24 hours) at 03/25/14 1217 Last data filed at 03/25/14 1019  Gross per 24 hour  Intake 3511.67 ml  Output      5 ml  Net 3506.67 ml   Filed Weights   03/24/14 0148 03/24/14 0633  Weight: 102.513 kg (226 lb) 103.2 kg (227 lb 8.2 oz)    Exam:   General:  Obese 63 year old African-American woman lying in bed, in no acute distress.  Cardiovascular: S1, S2, with a soft systolic murmur.  Respiratory: Lungs are clear anteriorly with decreased breath sounds in the bases. Breathing is nonlabored.  Abdomen: Obese, positive bowel sounds, soft, mildly to moderately tender right mid abdomen and right lower quadrant. No appreciable distention or masses palpated.  Musculoskeletal/extremities: No pedal edema. No pretibial edema. No calf tenderness. Pedal pulses palpable bilaterally.  Neurologic/psychiatric: She is alert and oriented x3. Pleasant affect. Cranial nerves II through XII are grossly intact.   Data Reviewed: Basic Metabolic Panel:  Recent Labs Lab 03/24/14 0254 03/24/14 0612  03/25/14 0539  NA 140 141 140  K 3.1* 3.5* 4.2  CL 96 99 104  CO2 30 28 27   GLUCOSE 145* 156* 123*  BUN 17 15 9   CREATININE 1.17* 1.02 0.96  CALCIUM 9.2 8.7 8.6   Liver Function Tests:  Recent Labs Lab 03/24/14 0254 03/24/14 0612 03/25/14 0539  AST 15 13 12   ALT 9 8 7   ALKPHOS 107 99 83  BILITOT 0.4 0.3 0.3  PROT 8.1 7.2 6.7  ALBUMIN 3.6 3.2* 2.8*    Recent Labs Lab 03/24/14 0254  LIPASE 14   No results found for this basename: AMMONIA,  in the last 168 hours CBC:  Recent Labs Lab 03/24/14 0254 03/24/14 0612 03/25/14 0539  WBC 13.2* 14.2* 7.4  NEUTROABS 10.6*  --   --   HGB 13.0 12.2 10.4*  HCT 38.3 37.5 32.6*  MCV 81.0 82.1 83.8  PLT 386 381 359   Cardiac Enzymes:  Recent Labs Lab 03/24/14 1327 03/24/14 1918 03/25/14 0102  TROPONINI <0.30 <0.30 <0.30   BNP (last 3 results) No results found for this basename: PROBNP,  in the last 8760 hours CBG:  Recent Labs Lab 03/24/14 1135 03/24/14 1623 03/24/14 2120 03/25/14 0800 03/25/14 1152  GLUCAP 126* 128* 105* 141* 98    No results found for this or any previous visit (from the past 240 hour(s)).   Studies: Ct Abdomen Pelvis W Contrast  03/24/2014   CLINICAL DATA:  Severe left upper quadrant abdominal pain for 1 day:  EXAM: CT ABDOMEN AND PELVIS WITH CONTRAST  TECHNIQUE: Multidetector CT imaging of the abdomen and  pelvis was performed using the standard protocol following bolus administration of intravenous contrast.  CONTRAST:  49mL OMNIPAQUE IOHEXOL 300 MG/ML SOLN, 141mL OMNIPAQUE IOHEXOL 300 MG/ML SOLN  COMPARISON:  Lumbar spine radiographs performed 08/24/2013, and CT of the abdomen and pelvis performed 04/23/2011  FINDINGS: The visualized lung bases are clear.  The liver and spleen are unremarkable in appearance. The gallbladder is within normal limits. The pancreas and adrenal glands are unremarkable.  Prominent left renal cysts are seen, the largest of which measures 6.8 cm in size. The right  kidney is unremarkable in appearance. There is no evidence of hydronephrosis. No renal or ureteral stones are seen. No perinephric stranding is appreciated.  There is diffuse wall thickening involving a long segment of proximal ileum, with associated soft tissue inflammation and trace free fluid, concerning for infectious or inflammatory process. There is no evidence of ischemia. A small amount of associated free fluid is seen within the pelvis.  The stomach is within normal limits. No acute vascular abnormalities are seen.  The appendix is decompressed and not well assessed. No evidence for appendicitis.  The colon is relatively decompressed and unremarkable in appearance.  The bladder is mildly distended and grossly unremarkable in appearance. The patient is status post hysterectomy. No suspicious adnexal masses are seen. The ovaries appear relatively symmetric. No inguinal lymphadenopathy is seen.  No acute osseous abnormalities are identified. Facet disease is noted at the lower lumbar spine.  IMPRESSION: 1. Diffuse proximal ileal wall thickening noted, with associated soft tissue inflammation and trace free fluid, concerning for an infectious or inflammatory ileitis. No evidence of ischemia. The distal small bowel is unremarkable. Small amount of associated free fluid seen within the pelvis. 2. Left renal cysts noted.   Electronically Signed   By: Garald Balding M.D.   On: 03/24/2014 03:33   Dg Chest Port 1 View  03/24/2014   CLINICAL DATA:  Chest pain.  Abdominal pain.  Nausea and vomiting.  EXAM: PORTABLE CHEST - 1 VIEW  COMPARISON:  None.  FINDINGS: Cardiac silhouette upper normal in size to slightly enlarged for AP portable technique. Suboptimal inspiration due to body habitus accounts for crowded bronchovascular markings diffusely. Taking this into account, lungs clear. No pleural effusions.  IMPRESSION: Borderline to mild cardiomegaly. Suboptimal inspiration. No acute cardiopulmonary disease.    Electronically Signed   By: Evangeline Dakin M.D.   On: 03/24/2014 13:51    Scheduled Meds: . atorvastatin  10 mg Oral QPC supper  . ciprofloxacin  400 mg Intravenous Q12H  . enoxaparin (LOVENOX) injection  100 mg Subcutaneous BID  .  HYDROmorphone (DILAUDID) injection  0.5 mg Intravenous 3 times per day  . insulin aspart  0-9 Units Subcutaneous TID WC  . metoprolol tartrate  25 mg Oral BID  . ondansetron (ZOFRAN) IV  4 mg Intravenous TID WC & HS  . pantoprazole  40 mg Oral BID AC   Continuous Infusions: . dextrose 5 % and 0.9 % NaCl with KCl 40 mEq/L 100 mL/hr at 03/25/14 0522   Assessment and plan:  Principal Problem:   Ileitis Active Problems:   GERD (gastroesophageal reflux disease)   Chest pain, unspecified   HTN (hypertension)   Hypercholesteremia   Obesity   Anemia, unspecified   1. Ileitis.  This may be contributing to her abdominal pain. She denies diarrhea and has not had a bowel movement since this hospitalization. Therefore, contact precautions and C. difficile PCR were discontinued. She was started on Cipro and Flagyl.  She has had some symptomatic improvement. Gastroenterologist, Dr. Oneida Alar was consulted. Per her assessment, the patient's symptomatology is less likely Crohn's disease, et Ronney Asters. She went on to say that if there is no clinical improvement, the patient will need a retrograde DBE. She ordered protonic and Zofran Q. a.c. and each bedtime. Full liquid diet started. GERD. Twice a day PPI with Protonix started. Chest pain. The patient reports that this is chronic he generally attributes it to acid reflux. For evaluation, a number of studies were ordered. Her cardiac enzymes were all negative. Her EKG reveals sinus bradycardia with no other acute changes. Her d-dimer was greater than 20. Given this finding, we'll start Lovenox empirically and order a CT angiogram of her chest and bilateral lower extremity venous ultrasound. We'll treat her pain  accordingly. Prediabetes. Her outpatient hemoglobin A1c was 6.1 in August 2015. We'll discontinue dextrose and IV fluids. We'll order hemoglobin A1c. We'll monitor her capillary blood glucose 3 times daily and start sliding scale NovoLog. Hypertension. Currently stable on metoprolol. Anemia. We'll order an anemia panel and TSH for further evaluation.   Time spent: 35 minutes    [facility] Pager [phone]. If 7PM-7AM, please contact night-coverage at [email or web address], password [facility] 03/25/2014, 12:17 PM  LOS: 1 day

## 2014-03-25 NOTE — Progress Notes (Addendum)
ANTICOAGULATION CONSULT NOTE - Initial Consult  Pharmacy Consult for Lovenox Indication: r/o VTE  Allergies  Allergen Reactions  . Lisinopril Swelling    Tingling, swelling lips   . Penicillins Hives and Itching    Patient Measurements: Height: 5\' 3"  (160 cm) Weight: 227 lb 8.2 oz (103.2 kg) IBW/kg (Calculated) : 52.4  Vital Signs: Temp: 98.3 F (36.8 C) (09/20 0655) Temp src: Oral (09/20 0655) BP: 132/57 mmHg (09/20 0655) Pulse Rate: 70 (09/20 0655)  Labs:  Recent Labs  03/24/14 0254 03/24/14 0612 03/24/14 1327 03/24/14 1918 03/25/14 0102 03/25/14 0539  HGB 13.0 12.2  --   --   --  10.4*  HCT 38.3 37.5  --   --   --  32.6*  PLT 386 381  --   --   --  359  CREATININE 1.17* 1.02  --   --   --  0.96  TROPONINI  --   --  <0.30 <0.30 <0.30  --     Estimated Creatinine Clearance: 67.9 ml/min (by C-G formula based on Cr of 0.96).   Medical History: Past Medical History  Diagnosis Date  . Diabetes mellitus 2012    borderline diabetes, never on medicine   . Acid reflux 2000    HA with nexium   . Enteritis 04/23/2011  . Hypercholesteremia 2011  . Hypertension 1990s     Medications:  Scheduled:  . atorvastatin  10 mg Oral QPC supper  . ciprofloxacin  400 mg Intravenous Q12H  . enoxaparin (LOVENOX) injection  100 mg Subcutaneous BID  . insulin aspart  0-9 Units Subcutaneous TID WC  . metoprolol tartrate  25 mg Oral BID  . metronidazole  500 mg Intravenous Q8H  . ondansetron (ZOFRAN) IV  4 mg Intravenous TID WC & HS  . pantoprazole  40 mg Oral BID AC    Assessment: 63 yo F admitted with 9/19 with epigastric pain, presumed ileitis based on history.  She has had episodes of chest pain.  DDimer elevated.  Starting empiric Lovenox while r/o PE.  She has been wearing TED hose.  CBC reviewed.  No bleeding noted.   Goal of Therapy:  Anti-Xa level 0.6-1 units/ml 4hrs after LMWH dose given Monitor platelets by anticoagulation protocol: Yes   Plan:  Lovenox  100mg  sq q12h CBC on MWF F/U results of chest CT  Biagio Borg 03/25/2014,11:16 AM  CT chest & venous dopplers where unrevealing for VTE.  Spoke with Dr Caryn Section.  Lovenox decreased to px dose.  Pharmacy to sign off.  Please re-consult if needed.   Netta Cedars, PharmD, BCPS 03/25/14 @ 3pm

## 2014-03-26 ENCOUNTER — Ambulatory Visit: Payer: Self-pay

## 2014-03-26 LAB — GLUCOSE, CAPILLARY
GLUCOSE-CAPILLARY: 93 mg/dL (ref 70–99)
GLUCOSE-CAPILLARY: 100 mg/dL — AB (ref 70–99)
Glucose-Capillary: 108 mg/dL — ABNORMAL HIGH (ref 70–99)
Glucose-Capillary: 98 mg/dL (ref 70–99)

## 2014-03-26 LAB — VITAMIN B12: VITAMIN B 12: 878 pg/mL (ref 211–911)

## 2014-03-26 LAB — CBC
HCT: 33.2 % — ABNORMAL LOW (ref 36.0–46.0)
Hemoglobin: 10.7 g/dL — ABNORMAL LOW (ref 12.0–15.0)
MCH: 26.9 pg (ref 26.0–34.0)
MCHC: 32.2 g/dL (ref 30.0–36.0)
MCV: 83.4 fL (ref 78.0–100.0)
PLATELETS: 377 10*3/uL (ref 150–400)
RBC: 3.98 MIL/uL (ref 3.87–5.11)
RDW: 14.3 % (ref 11.5–15.5)
WBC: 6.8 10*3/uL (ref 4.0–10.5)

## 2014-03-26 LAB — IRON AND TIBC
Iron: 51 ug/dL (ref 42–135)
SATURATION RATIOS: 22 % (ref 20–55)
TIBC: 232 ug/dL — AB (ref 250–470)
UIBC: 181 ug/dL (ref 125–400)

## 2014-03-26 LAB — FERRITIN: FERRITIN: 267 ng/mL (ref 10–291)

## 2014-03-26 LAB — TSH: TSH: 1.18 u[IU]/mL (ref 0.350–4.500)

## 2014-03-26 LAB — FOLATE: Folate: 12.3 ng/mL

## 2014-03-26 MED ORDER — HYDROCODONE-ACETAMINOPHEN 5-325 MG PO TABS
1.0000 | ORAL_TABLET | ORAL | Status: DC | PRN
  Administered 2014-03-26 – 2014-03-27 (×3): 1 via ORAL
  Filled 2014-03-26 (×3): qty 1

## 2014-03-26 MED ORDER — HYDROCHLOROTHIAZIDE 25 MG PO TABS
25.0000 mg | ORAL_TABLET | Freq: Every day | ORAL | Status: DC
  Administered 2014-03-26 – 2014-03-27 (×2): 25 mg via ORAL
  Filled 2014-03-26 (×2): qty 1

## 2014-03-26 MED ORDER — ONDANSETRON HCL 4 MG PO TABS
4.0000 mg | ORAL_TABLET | Freq: Three times a day (TID) | ORAL | Status: DC
  Administered 2014-03-26 – 2014-03-27 (×4): 4 mg via ORAL
  Filled 2014-03-26: qty 1

## 2014-03-26 NOTE — Progress Notes (Signed)
[facility] PROGRESS NOTE  Sandra [name] OJJ:[id number] DOB: 1951/06/09 DOA: 03/24/2014 PCP: Minerva Ends, MD    Code Status: Full code Family Communication: Discussed briefly with family on 03/25/14. Disposition Plan: Discharge to home when clinically improved, possibly tomorrow.   Consultants:  Gastroenterology, Dr. Oneida Alar  Procedures:  None  Antibiotics:  Cipro 9/19>>  Metronidazole 9/19>>  HPI/Subjective: The patient complains of some nausea this morning after receiving oral Dilaudid. Otherwise, her pain has subsided substantially. She has not had a bowel movement yet.  Objective: Filed Vitals:   03/26/14 1127  BP: 171/69  Pulse: 58  Temp:   Resp:    temperature 98.0. Respiratory rate 18. oxygen saturation 97% on room air  Intake/Output Summary (Last 24 hours) at 03/26/14 1145 Last data filed at 03/26/14 9371  Gross per 24 hour  Intake 2679.17 ml  Output      0 ml  Net 2679.17 ml   Filed Weights   03/24/14 0148 03/24/14 0633  Weight: 102.513 kg (226 lb) 103.2 kg (227 lb 8.2 oz)    Exam:   General:  Obese 63 year old African-American woman lying in bed, in no acute distress.  Cardiovascular: S1, S2, with a soft systolic murmur.  Respiratory: Lungs are clear anteriorly with decreased breath sounds in the bases. Breathing is nonlabored.  Abdomen: Obese, positive bowel sounds, soft, minimal right lower quadrant tenderness. No appreciable distention or masses palpated.  Musculoskeletal/extremities: No pedal edema. No pretibial edema. No calf tenderness. Pedal pulses palpable bilaterally.  Neurologic/psychiatric: She is alert and oriented x3. Pleasant affect. Cranial nerves II through XII are grossly intact.   Data Reviewed: Basic Metabolic Panel:  Recent Labs Lab 03/24/14 0254 03/24/14 0612 03/25/14 0539  NA 140 141 140  K 3.1* 3.5* 4.2  CL 96 99 104  CO2 30 28 27   GLUCOSE 145* 156* 123*  BUN 17 15 9   CREATININE 1.17* 1.02  0.96  CALCIUM 9.2 8.7 8.6   Liver Function Tests:  Recent Labs Lab 03/24/14 0254 03/24/14 0612 03/25/14 0539  AST 15 13 12   ALT 9 8 7   ALKPHOS 107 99 83  BILITOT 0.4 0.3 0.3  PROT 8.1 7.2 6.7  ALBUMIN 3.6 3.2* 2.8*    Recent Labs Lab 03/24/14 0254  LIPASE 14   No results found for this basename: AMMONIA,  in the last 168 hours CBC:  Recent Labs Lab 03/24/14 0254 03/24/14 0612 03/25/14 0539 03/26/14 0558  WBC 13.2* 14.2* 7.4 6.8  NEUTROABS 10.6*  --   --   --   HGB 13.0 12.2 10.4* 10.7*  HCT 38.3 37.5 32.6* 33.2*  MCV 81.0 82.1 83.8 83.4  PLT 386 381 359 377   Cardiac Enzymes:  Recent Labs Lab 03/24/14 1327 03/24/14 1918 03/25/14 0102  TROPONINI <0.30 <0.30 <0.30   BNP (last 3 results) No results found for this basename: PROBNP,  in the last 8760 hours CBG:  Recent Labs Lab 03/25/14 1152 03/25/14 1621 03/25/14 2032 03/26/14 0735 03/26/14 1132  GLUCAP 98 99 98 108* 98    No results found for this or any previous visit (from the past 240 hour(s)).   Studies: Ct Angio Chest Pe W/cm &/or Wo Cm  03/25/2014   CLINICAL DATA:  Shortness of breath.  Chest pain.  Elevated D-dimer.  EXAM: CT ANGIOGRAPHY CHEST WITH CONTRAST  TECHNIQUE: Multidetector CT imaging of the chest was performed using the standard protocol during bolus administration of intravenous contrast. Multiplanar CT image reconstructions and MIPs were obtained  to evaluate the vascular anatomy.  CONTRAST:  167mL OMNIPAQUE IOHEXOL 350 MG/ML SOLN  COMPARISON:  Chest radiograph 03/24/2014  FINDINGS: Evaluation limited secondary to motion artifact. Within the above limitations, no definite evidence for pulmonary embolism.  Small peripherally calcified nodule within the right lobe of thyroid measuring 11 mm. No enlarged axillary, mediastinal or hilar lymphadenopathy. Normal heart size. No pericardial effusion.  Central airways are patent. There is a small right pleural effusion. Subpleural consolidative  opacity within the right lower lobe is favored to represent atelectasis. No pneumothorax.  Limited visualization of the upper abdomen demonstrates no focal abnormality. No aggressive or acute appearing osseous lesions.  Review of the MIP images confirms the above findings.  IMPRESSION: Examination limited secondary to motion artifact. Within the above limitations no definite evidence for pulmonary embolism.  Small right pleural effusion and underlying consolidative opacities within the right lower lobe suggestive of atelectasis.   Electronically Signed   By: Lovey Newcomer M.D.   On: 03/25/2014 13:16   US Venous Img Lower Bilateral  03/25/2014   CLINICAL DATA:  Unexplained elevated D-dimer.  EXAM: BILATERAL LOWER EXTREMITY VENOUS DOPPLER ULTRASOUND  TECHNIQUE: Gray-scale sonography with graded compression, as well as color Doppler and duplex ultrasound were performed to evaluate the lower extremity deep venous systems from the level of the common femoral vein and including the common femoral, femoral, profunda femoral, popliteal and calf veins including the posterior tibial, peroneal and gastrocnemius veins when visible. The superficial great saphenous vein was also interrogated. Spectral Doppler was utilized to evaluate flow at rest and with distal augmentation maneuvers in the common femoral, femoral and popliteal veins.  COMPARISON:  None.  FINDINGS: RIGHT LOWER EXTREMITY  Common Femoral Vein: No evidence of thrombus. Normal compressibility, respiratory phasicity and response to augmentation.  Saphenofemoral Junction: No evidence of thrombus. Normal compressibility and flow on color Doppler imaging.  Profunda Femoral Vein: No evidence of thrombus. Normal compressibility and flow on color Doppler imaging.  Femoral Vein: No evidence of thrombus. Normal compressibility, respiratory phasicity and response to augmentation.  Popliteal Vein: No evidence of thrombus. Normal compressibility, respiratory phasicity and  response to augmentation.  Calf Veins: No evidence of thrombus. Normal compressibility and flow on color Doppler imaging.  Superficial Great Saphenous Vein: No evidence of thrombus. Normal compressibility and flow on color Doppler imaging.  Venous Reflux:  Not evaluated.  Other Findings:  None.  LEFT LOWER EXTREMITY  Common Femoral Vein: No evidence of thrombus. Normal compressibility, respiratory phasicity and response to augmentation.  Saphenofemoral Junction: No evidence of thrombus. Normal compressibility and flow on color Doppler imaging.  Profunda Femoral Vein: No evidence of thrombus. Normal compressibility and flow on color Doppler imaging.  Femoral Vein: No evidence of thrombus. Normal compressibility, respiratory phasicity and response to augmentation.  Popliteal Vein: No evidence of thrombus. Normal compressibility, respiratory phasicity and response to augmentation.  Calf Veins: No evidence of thrombus. Normal compressibility and flow on color Doppler imaging.  Superficial Great Saphenous Vein: No evidence of thrombus. Normal compressibility and flow on color Doppler imaging.  Venous Reflux:  Not evaluated.  Other Findings:  None.  IMPRESSION: No evidence of DVT or superficial thrombophlebitis involving either the right or left lower extremity.   Electronically Signed   By: Evangeline Dakin M.D.   On: 03/25/2014 14:28   Dg Chest Port 1 View  03/24/2014   CLINICAL DATA:  Chest pain.  Abdominal pain.  Nausea and vomiting.  EXAM: PORTABLE CHEST - 1 VIEW  COMPARISON:  None.  FINDINGS: Cardiac silhouette upper normal in size to slightly enlarged for AP portable technique. Suboptimal inspiration due to body habitus accounts for crowded bronchovascular markings diffusely. Taking this into account, lungs clear. No pleural effusions.  IMPRESSION: Borderline to mild cardiomegaly. Suboptimal inspiration. No acute cardiopulmonary disease.   Electronically Signed   By: Evangeline Dakin M.D.   On: 03/24/2014 13:51     Scheduled Meds: . atorvastatin  10 mg Oral QPC supper  . ciprofloxacin  400 mg Intravenous Q12H  . enoxaparin (LOVENOX) injection  40 mg Subcutaneous Q24H  . insulin aspart  0-9 Units Subcutaneous TID WC  . metoprolol tartrate  25 mg Oral BID  . ondansetron (ZOFRAN) IV  4 mg Intravenous TID WC & HS  . pantoprazole  40 mg Oral BID AC   Continuous Infusions: . 0.9 % NaCl with KCl 20 mEq / L 50 mL/hr at 03/26/14 1142   Assessment and plan:  Principal Problem:   Ileitis Active Problems:   GERD (gastroesophageal reflux disease)   Chest pain, unspecified   HTN (hypertension)   Hypercholesteremia   Obesity   Anemia, unspecified   1. Ileitis.  This is probably the source of her abdominal pain. She denies diarrhea and has not had a bowel movement since this hospitalization. C. difficile PCR was discontinued. She was started on Cipro and Flagyl. She is improving clinically. The nausea may be from Dilaudid which has been discontinued. Gastroenterologist, Dr. Oneida Alar was consulted. Per her assessment, the patient's symptomatology is less likely Crohn's disease, et Ronney Asters. She went on to say that if there is no clinical improvement, the patient will need a retrograde DBE. The patient has improved and her diet is being advanced by GI. Continue Protonix and Zofran Q. a.c. and each bedtime. Flagyl was discontinued as it also could be a source of her nausea. GERD. Twice a day PPI with Protonix started. Chest pain. The patient reports that this is chronic he generally attributes it to acid reflux. For evaluation, a number of studies were ordered. Her cardiac enzymes were all negative. Her EKG revealed sinus bradycardia with no other acute changes. Her d-dimer was greater than 20, but her CT angiogram was negative for PE and her lower extremity venous Doppler ultrasound was negative for DVT. One dose of full dose Lovenox was given and was ultimately discontinued. Prediabetes. Her outpatient  hemoglobin A1c was 6.1 in August 2015. We'll discontinue dextrose and IV fluids. We'll monitor her capillary blood glucose 3 times daily and continue sliding scale NovoLog. Carbohydrate modified diet started. Hypertension. Blood pressure currently trending up. Continue metoprolol. Will restart hydrochlorothiazide. Anemia. Anemia panel and TSH were ordered for further evaluation and are pending.      Time spent: 25 minutes    [facility] Pager [phone]. If 7PM-7AM, please contact night-coverage at [email or web address], password [facility] 03/26/2014, 11:45 AM  LOS: 2 days

## 2014-03-26 NOTE — Progress Notes (Signed)
Subjective: Nauseated this morning after Dilaudid at 0600. Nausea started just shortly after receiving dose. Ate potato soup yesterday and felt nauseated. No abdominal pain. Belching. Per nursing, patient did well overnight until Dilaudid received.   Objective: Vital signs in last 24 hours: Temp:  [98 F (36.7 C)-98.6 F (37 C)] 98 F (36.7 C) (09/21 0701) Pulse Rate:  [58-59] 58 (09/21 0701) Resp:  [18] 18 (09/21 0701) BP: (145-159)/(51-68) 148/57 mmHg (09/21 0701) SpO2:  [95 %-98 %] 97 % (09/21 0701) Last BM Date: 03/24/14 General:   Alert and oriented, appears uncomfortable Head:  Normocephalic and atraumatic. Abdomen:  Bowel sounds present, obese, soft, non-tender, non-distended. Neurologic:  Alert and  oriented x4;  grossly normal neurologically. Skin:  Warm and dry, intact without significant lesions.   Intake/Output from previous day: 09/20 0701 - 09/21 0700 In: 2759.2 [P.O.:720; I.V.:1639.2; IV Piggyback:400] Out: -  Intake/Output this shift:    Lab Results:  Recent Labs  03/24/14 0612 03/25/14 0539 03/26/14 0558  WBC 14.2* 7.4 6.8  HGB 12.2 10.4* 10.7*  HCT 37.5 32.6* 33.2*  PLT 381 359 377   BMET  Recent Labs  03/24/14 0254 03/24/14 0612 03/25/14 0539  NA 140 141 140  K 3.1* 3.5* 4.2  CL 96 99 104  CO2 30 28 27   GLUCOSE 145* 156* 123*  BUN 17 15 9   CREATININE 1.17* 1.02 0.96  CALCIUM 9.2 8.7 8.6   LFT  Recent Labs  03/24/14 0254 03/24/14 0612 03/25/14 0539  PROT 8.1 7.2 6.7  ALBUMIN 3.6 3.2* 2.8*  AST 15 13 12   ALT 9 8 7   ALKPHOS 107 99 83  BILITOT 0.4 0.3 0.3     Studies/Results: Ct Angio Chest Pe W/cm &/or Wo Cm  03/25/2014   CLINICAL DATA:  Shortness of breath.  Chest pain.  Elevated D-dimer.  EXAM: CT ANGIOGRAPHY CHEST WITH CONTRAST  TECHNIQUE: Multidetector CT imaging of the chest was performed using the standard protocol during bolus administration of intravenous contrast. Multiplanar CT image reconstructions and MIPs were  obtained to evaluate the vascular anatomy.  CONTRAST:  157mL OMNIPAQUE IOHEXOL 350 MG/ML SOLN  COMPARISON:  Chest radiograph 03/24/2014  FINDINGS: Evaluation limited secondary to motion artifact. Within the above limitations, no definite evidence for pulmonary embolism.  Small peripherally calcified nodule within the right lobe of thyroid measuring 11 mm. No enlarged axillary, mediastinal or hilar lymphadenopathy. Normal heart size. No pericardial effusion.  Central airways are patent. There is a small right pleural effusion. Subpleural consolidative opacity within the right lower lobe is favored to represent atelectasis. No pneumothorax.  Limited visualization of the upper abdomen demonstrates no focal abnormality. No aggressive or acute appearing osseous lesions.  Review of the MIP images confirms the above findings.  IMPRESSION: Examination limited secondary to motion artifact. Within the above limitations no definite evidence for pulmonary embolism.  Small right pleural effusion and underlying consolidative opacities within the right lower lobe suggestive of atelectasis.   Electronically Signed   By: Lovey Newcomer M.D.   On: 03/25/2014 13:16   US Venous Img Lower Bilateral  03/25/2014   CLINICAL DATA:  Unexplained elevated D-dimer.  EXAM: BILATERAL LOWER EXTREMITY VENOUS DOPPLER ULTRASOUND  TECHNIQUE: Gray-scale sonography with graded compression, as well as color Doppler and duplex ultrasound were performed to evaluate the lower extremity deep venous systems from the level of the common femoral vein and including the common femoral, femoral, profunda femoral, popliteal and calf veins including the posterior tibial, peroneal  and gastrocnemius veins when visible. The superficial great saphenous vein was also interrogated. Spectral Doppler was utilized to evaluate flow at rest and with distal augmentation maneuvers in the common femoral, femoral and popliteal veins.  COMPARISON:  None.  FINDINGS: RIGHT LOWER  EXTREMITY  Common Femoral Vein: No evidence of thrombus. Normal compressibility, respiratory phasicity and response to augmentation.  Saphenofemoral Junction: No evidence of thrombus. Normal compressibility and flow on color Doppler imaging.  Profunda Femoral Vein: No evidence of thrombus. Normal compressibility and flow on color Doppler imaging.  Femoral Vein: No evidence of thrombus. Normal compressibility, respiratory phasicity and response to augmentation.  Popliteal Vein: No evidence of thrombus. Normal compressibility, respiratory phasicity and response to augmentation.  Calf Veins: No evidence of thrombus. Normal compressibility and flow on color Doppler imaging.  Superficial Great Saphenous Vein: No evidence of thrombus. Normal compressibility and flow on color Doppler imaging.  Venous Reflux:  Not evaluated.  Other Findings:  None.  LEFT LOWER EXTREMITY  Common Femoral Vein: No evidence of thrombus. Normal compressibility, respiratory phasicity and response to augmentation.  Saphenofemoral Junction: No evidence of thrombus. Normal compressibility and flow on color Doppler imaging.  Profunda Femoral Vein: No evidence of thrombus. Normal compressibility and flow on color Doppler imaging.  Femoral Vein: No evidence of thrombus. Normal compressibility, respiratory phasicity and response to augmentation.  Popliteal Vein: No evidence of thrombus. Normal compressibility, respiratory phasicity and response to augmentation.  Calf Veins: No evidence of thrombus. Normal compressibility and flow on color Doppler imaging.  Superficial Great Saphenous Vein: No evidence of thrombus. Normal compressibility and flow on color Doppler imaging.  Venous Reflux:  Not evaluated.  Other Findings:  None.  IMPRESSION: No evidence of DVT or superficial thrombophlebitis involving either the right or left lower extremity.   Electronically Signed   By: Evangeline Dakin M.D.   On: 03/25/2014 14:28   Dg Chest Port 1 View  03/24/2014    CLINICAL DATA:  Chest pain.  Abdominal pain.  Nausea and vomiting.  EXAM: PORTABLE CHEST - 1 VIEW  COMPARISON:  None.  FINDINGS: Cardiac silhouette upper normal in size to slightly enlarged for AP portable technique. Suboptimal inspiration due to body habitus accounts for crowded bronchovascular markings diffusely. Taking this into account, lungs clear. No pleural effusions.  IMPRESSION: Borderline to mild cardiomegaly. Suboptimal inspiration. No acute cardiopulmonary disease.   Electronically Signed   By: Evangeline Dakin M.D.   On: 03/24/2014 13:51    Assessment: 62 year old female admitted with abdominal pain, N/V secondary to ileitis as noted on CT. Abdominal pain improved, tolerating diet yesterday but with recurrent nausea after Dilaudid dose this morning. Discussed with nursing, who states patient did well yesterday evening and overnight. Will discontinue Dilaudid and transition to oral medication for pain control. Will advance to dysphagia 2, low-fat diet at lunch if nausea improved.    Normocytic anemia: multifactorial. Anemia panel pending.       Plan: BID PPI Cipro for empiric antibiotics Discontinue scheduled Dilaudid Norco for pain Continue Zofran scheduled dosing Advance to dysphagia 2 low-fat diet for lunch Repeat CT or CT enterography in 4-6 weeks to document resolution   Orvil Feil, ANP-BC [facility]     LOS: 2 days    03/26/2014, 8:41 AM

## 2014-03-26 NOTE — Progress Notes (Signed)
REVIEWED. AGREE. NO ADDITIONAL RECOMMENDATIONS. 

## 2014-03-26 NOTE — Care Management Note (Signed)
    Page 1 of 1   03/26/2014     12:57:14 PM CARE MANAGEMENT NOTE 03/26/2014  Patient:  KEYUNNA, COCO   Account Number:  [id number]  Date Initiated:  03/26/2014  Documentation initiated by:  Theophilus Kinds  Subjective/Objective Assessment:   Pt admitted from home with ileitis. Pt lives alone and will return home at discharge. Pt is independent with ADL's. Pt PCP is Dr. Adrian Blackwater in [place].     Action/Plan:   Pt given numbers of [facility], [facility], and [facility] for transportation to appts and other transportation needs. PTs son helps her pay for her medications.   Anticipated DC Date:  03/28/2014   Anticipated DC Plan:  [place]  CM consult      Choice offered to / List presented to:             Status of service:  Completed, signed off Medicare Important Message given?   (If response is "NO", the following Medicare IM given date fields will be blank) Date Medicare IM given:   Medicare IM given by:   Date Additional Medicare IM given:   Additional Medicare IM given by:    Discharge Disposition:  HOME/SELF CARE  Per UR Regulation:    If discussed at Long Length of Stay Meetings, dates discussed:    Comments:  03/26/14 [place], RN BSN CM

## 2014-03-27 LAB — GLUCOSE, CAPILLARY
GLUCOSE-CAPILLARY: 114 mg/dL — AB (ref 70–99)
Glucose-Capillary: 103 mg/dL — ABNORMAL HIGH (ref 70–99)
Glucose-Capillary: 140 mg/dL — ABNORMAL HIGH (ref 70–99)

## 2014-03-27 MED ORDER — RANITIDINE HCL 150 MG PO TABS: ORAL_TABLET | ORAL | Status: DC

## 2014-03-27 MED ORDER — PANTOPRAZOLE SODIUM 40 MG PO TBEC: 40.0000 mg | DELAYED_RELEASE_TABLET | Freq: Every day | ORAL | Status: DC

## 2014-03-27 MED ORDER — CIPROFLOXACIN HCL 500 MG PO TABS: 500.0000 mg | ORAL_TABLET | Freq: Two times a day (BID) | ORAL | Status: DC

## 2014-03-27 MED ORDER — ONDANSETRON HCL 4 MG PO TABS: 4.0000 mg | ORAL_TABLET | Freq: Three times a day (TID) | ORAL | Status: DC | PRN

## 2014-03-27 MED ORDER — CIPROFLOXACIN HCL 250 MG PO TABS
500.0000 mg | ORAL_TABLET | Freq: Two times a day (BID) | ORAL | Status: DC
  Administered 2014-03-27: 500 mg via ORAL
  Filled 2014-03-27: qty 2

## 2014-03-27 MED ORDER — HYDROCODONE-ACETAMINOPHEN 5-325 MG PO TABS: 1.0000 | ORAL_TABLET | ORAL | Status: DC | PRN

## 2014-03-27 NOTE — Discharge Summary (Signed)
Physician Discharge Summary  Sandra [name] NLZ:[id number] DOB: 19-May-1951 DOA: 03/24/2014  PCP: Minerva Ends, MD  Admit date: 03/24/2014 Discharge date: 03/27/2014  Time spent: Greater than 30 minutes  Recommendations for Outpatient Follow-up:  1. Recommend follow up outpatient CT or CT enterography abdomen/pelvis  In 4-6 weeks to document resolution. Consider further outpatient evaluation of the patient's anemia.    Discharge Diagnoses:  Principal Problem: 1.Ileitis 2.GERD (gastroesophageal reflux disease) 3.Chest pain, unspecified 4.HTN (hypertension) 5. Prediabetes. HbA1c 6.1. 6.Hypercholesteremia 7.Obesity 8.Anemia, unspecified. Hb 10.7 at discharge. Total iron 51; Ferritin 267; Vitamin B 12 878. TSH 1.18.   Discharge Condition: Improved  Diet recommendation: Soft, carb-modified  Filed Weights   03/24/14 0148 03/24/14 9024  Weight: 102.513 kg (226 lb) 103.2 kg (227 lb 8.2 oz)    History of present illness:   HPI:  Sandra [name] is an 63 y.o. female with hx of obesity, Pre-diabetes- diet controlled, GERD, HTN, and hyperlipidemia, who was seen in the ER the day before admission for mouth swelling. She was given Bactrim for which she took 2 tablets. She presented again to the ER with epigastric pain, nausea and vomiting with soft stool for one day. She lives alone and denied any ill contacts. Of note, she had similar epigastric pain and was admitted in 2012 and was diagnosed with enteritis, though she never did have any upper or lower endoscopy. She did not follow up with any gastroenterologist. Evalution in the ER included a Cr of 1.17, K of 3.1, and WBC of 13K. She had normal liver function tests and normal lipase. An abdominal/pelvic CT showed diffuse proximal ileitis with no abscesses and no obstruction. Hospitalist was asked to admit her for further evaluation and treatment.    Hospital Course:   1. Ileitis.  The patient was started on Cipro and Flagyl.  Gentle IV fluids, analgesics and antiemetics were given. Eventually, Protonix was started for GERD symptoms. Stool studies were ordered, but she did not have any bowel movements. Gastroenterologist, Dr. Oneida Alar was consulted. Per her assessment, the patient's symptomatology was less likely Crohn's disease, lymphoma or NSAID entropathy. She went on to say that if there was no clinical improvement, the patient would need a retrograde DBE. Flagyl was discontinued and she was started on schuled Zofran. The patient did improve and her diet was advanced. She tolerated it well. She remained afebrile and her leukocytosis resolved. She was discharged on 7 more days of Cipro treatment. She will need a follow up CT or CT enteroscopy in 4-6 weeks to assess for interval resolution.  GERD.  H2 blocker was restarted, but was discontinued when she started having burning chest pain.Twice a day PPI with was Protonix started. Her symptoms dissipated. Chest pain.  The patient reported that this was chronic and she generally attributed it to acid reflux. For evaluation, a number of studies were ordered. Her cardiac enzymes were all negative. Her EKG revealed sinus bradycardia with no other acute changes. Her d-dimer was greater than 20. Given this finding, she was given a full dose of  Lovenox empirically and a CT angiogram of her chest and bilateral lower extremity venous ultrasound were ordered. Both were negative for VTE.  Prediabetes.  Her outpatient hemoglobin A1c was 6.1 in August 2015. This is treated with diet alone. Her capillary blood glucose was monitored 3 times daily and she was started on sliding scale NovoLog during the hospital course. Her glucose was well controlled. Hypertension.  Remained stable on metoprolol.  Anemia.  Her Hb fell form 13.2 to 10.7 gradually. This was thought to be dilutional from IV fluids. An anemia panel and TSH were for further evaluation. The anemia panel was virtually normal and her  TSH was WNL. Further evauation will be deferred to GI.   Procedures:  none  Consultations: Gastroenterology, Dr. Oneida Alar   Discharge Exam: Filed Vitals:   03/27/14 1600  BP: 157/85  Pulse: 56  Temp: 98.7 F (37.1 C)  Resp: 20    General: Obese 63 year old African-American woman lying in bed, in no acute distress. Cardiovascular: S1, S2, with a soft systolic murmur. Respiratory: Lungs are clear anteriorly with decreased breath sounds in the bases. Breathing is nonlabored. Abdomen: Obese, positive bowel sounds, soft, mildly  tender right mid abdomen and right lower quadrant (possible embellishment). No appreciable distention or masses palpated. Musculoskeletal/extremities: No pedal edema. No pretibial edema. No calf tenderness. Pedal pulses palpable bilaterally. Neurologic/psychiatric: She is alert and oriented x3. Slightly anxious affect. Cranial nerves II through XII are grossly intact.    Discharge Instructions You were cared for by a hospitalist during your hospital stay. If you have any questions about your discharge medications or the care you received while you were in the hospital after you are discharged, you can call the unit and asked to speak with the hospitalist on call if the hospitalist that took care of you is not available. Once you are discharged, your primary care physician will handle any further medical issues. Please note that NO REFILLS for any discharge medications will be authorized once you are discharged, as it is imperative that you return to your primary care physician (or establish a relationship with a primary care physician if you do not have one) for your aftercare needs so that they can reassess your need for medications and monitor your lab values.  Discharge Instructions   Diet - low sodium heart healthy    Complete by:  As directed      Diet Carb Modified    Complete by:  As directed      Discharge instructions    Complete by:  As directed   Follow a  soft diet, low fat diet without fried foods. Take medications as prescribed.     Increase activity slowly    Complete by:  As directed           Current Discharge Medication List    START taking these medications   Details  ciprofloxacin (CIPRO) 500 MG tablet Take 1 tablet (500 mg total) by mouth 2 (two) times daily. Take antibiotic for 7 more days for treatment of your infection. Qty: 14 tablet, Refills: 0    HYDROcodone-acetaminophen (NORCO/VICODIN) 5-325 MG per tablet Take 1 tablet by mouth every 4 (four) hours as needed for moderate pain. Qty: 30 tablet, Refills: 0    ondansetron (ZOFRAN) 4 MG tablet Take 1 tablet (4 mg total) by mouth every 8 (eight) hours as needed for nausea or vomiting. Qty: 20 tablet, Refills: 0    pantoprazole (PROTONIX) 40 MG tablet Take 1 tablet (40 mg total) by mouth daily. Treats acid reflux. Qty: 30 tablet, Refills: 3      CONTINUE these medications which have CHANGED   Details  ranitidine (ZANTAC) 150 MG tablet For treatment of gastric acid reflux. Take this medication twice daily only if you cannot afford Protonix. If you can get Protonix filled, then stop this medication.      CONTINUE these medications which have NOT CHANGED  Details  aspirin EC 81 MG tablet Take 81 mg by mouth daily.    atorvastatin (LIPITOR) 10 MG tablet Take 1 tablet (10 mg total) by mouth daily. Qty: 90 tablet, Refills: 3   Associated Diagnoses: Hypercholesteremia    metoprolol tartrate (LOPRESSOR) 25 MG tablet Take 1 tablet (25 mg total) by mouth 2 (two) times daily. Qty: 60 tablet, Refills: 1    OVER THE COUNTER MEDICATION Take 1 tablet by mouth daily as needed (allergies). OTC Allergy medication    hydrochlorothiazide (HYDRODIURIL) 25 MG tablet Take 25 mg by mouth daily.      STOP taking these medications     sulfamethoxazole-trimethoprim (SEPTRA DS) 800-160 MG per tablet        Allergies  Allergen Reactions  . Lisinopril Swelling    Tingling, swelling  lips   . Penicillins Hives and Itching   Follow-up Information   Follow up with Minerva Ends, MD On 04/02/2014. (at 5:00 pm)    Specialty:  Family Medicine   Contact information:   [place] [address] [phone]       Follow up with Barney Drain, MD. (Her office will call you with the follow up appointment.)    Specialty:  Gastroenterology   Contact information:   [facility] [address] [place] [address] [phone]        The results of significant diagnostics from this hospitalization (including imaging, microbiology, ancillary and laboratory) are listed below for reference.    Significant Diagnostic Studies: Ct Angio Chest Pe W/cm &/or Wo Cm  03/25/2014   CLINICAL DATA:  Shortness of breath.  Chest pain.  Elevated D-dimer.  EXAM: CT ANGIOGRAPHY CHEST WITH CONTRAST  TECHNIQUE: Multidetector CT imaging of the chest was performed using the standard protocol during bolus administration of intravenous contrast. Multiplanar CT image reconstructions and MIPs were obtained to evaluate the vascular anatomy.  CONTRAST:  157mL OMNIPAQUE IOHEXOL 350 MG/ML SOLN  COMPARISON:  Chest radiograph 03/24/2014  FINDINGS: Evaluation limited secondary to motion artifact. Within the above limitations, no definite evidence for pulmonary embolism.  Small peripherally calcified nodule within the right lobe of thyroid measuring 11 mm. No enlarged axillary, mediastinal or hilar lymphadenopathy. Normal heart size. No pericardial effusion.  Central airways are patent. There is a small right pleural effusion. Subpleural consolidative opacity within the right lower lobe is favored to represent atelectasis. No pneumothorax.  Limited visualization of the upper abdomen demonstrates no focal abnormality. No aggressive or acute appearing osseous lesions.  Review of the MIP images confirms the above findings.  IMPRESSION: Examination limited secondary to  motion artifact. Within the above limitations no definite evidence for pulmonary embolism.  Small right pleural effusion and underlying consolidative opacities within the right lower lobe suggestive of atelectasis.   Electronically Signed   By: Lovey Newcomer M.D.   On: 03/25/2014 13:16   Ct Abdomen Pelvis W Contrast  03/24/2014   CLINICAL DATA:  Severe left upper quadrant abdominal pain for 1 day:  EXAM: CT ABDOMEN AND PELVIS WITH CONTRAST  TECHNIQUE: Multidetector CT imaging of the abdomen and pelvis was performed using the standard protocol following bolus administration of intravenous contrast.  CONTRAST:  11mL OMNIPAQUE IOHEXOL 300 MG/ML SOLN, 182mL OMNIPAQUE IOHEXOL 300 MG/ML SOLN  COMPARISON:  Lumbar spine radiographs performed 08/24/2013, and CT of the abdomen and pelvis performed 04/23/2011  FINDINGS: The visualized lung bases are clear.  The liver and spleen are unremarkable in appearance. The  gallbladder is within normal limits. The pancreas and adrenal glands are unremarkable.  Prominent left renal cysts are seen, the largest of which measures 6.8 cm in size. The right kidney is unremarkable in appearance. There is no evidence of hydronephrosis. No renal or ureteral stones are seen. No perinephric stranding is appreciated.  There is diffuse wall thickening involving a long segment of proximal ileum, with associated soft tissue inflammation and trace free fluid, concerning for infectious or inflammatory process. There is no evidence of ischemia. A small amount of associated free fluid is seen within the pelvis.  The stomach is within normal limits. No acute vascular abnormalities are seen.  The appendix is decompressed and not well assessed. No evidence for appendicitis.  The colon is relatively decompressed and unremarkable in appearance.  The bladder is mildly distended and grossly unremarkable in appearance. The patient is status post hysterectomy. No suspicious adnexal masses are seen. The ovaries  appear relatively symmetric. No inguinal lymphadenopathy is seen.  No acute osseous abnormalities are identified. Facet disease is noted at the lower lumbar spine.  IMPRESSION: 1. Diffuse proximal ileal wall thickening noted, with associated soft tissue inflammation and trace free fluid, concerning for an infectious or inflammatory ileitis. No evidence of ischemia. The distal small bowel is unremarkable. Small amount of associated free fluid seen within the pelvis. 2. Left renal cysts noted.   Electronically Signed   By: Garald Balding M.D.   On: 03/24/2014 03:33   US Venous Img Lower Bilateral  03/25/2014   CLINICAL DATA:  Unexplained elevated D-dimer.  EXAM: BILATERAL LOWER EXTREMITY VENOUS DOPPLER ULTRASOUND  TECHNIQUE: Gray-scale sonography with graded compression, as well as color Doppler and duplex ultrasound were performed to evaluate the lower extremity deep venous systems from the level of the common femoral vein and including the common femoral, femoral, profunda femoral, popliteal and calf veins including the posterior tibial, peroneal and gastrocnemius veins when visible. The superficial great saphenous vein was also interrogated. Spectral Doppler was utilized to evaluate flow at rest and with distal augmentation maneuvers in the common femoral, femoral and popliteal veins.  COMPARISON:  None.  FINDINGS: RIGHT LOWER EXTREMITY  Common Femoral Vein: No evidence of thrombus. Normal compressibility, respiratory phasicity and response to augmentation.  Saphenofemoral Junction: No evidence of thrombus. Normal compressibility and flow on color Doppler imaging.  Profunda Femoral Vein: No evidence of thrombus. Normal compressibility and flow on color Doppler imaging.  Femoral Vein: No evidence of thrombus. Normal compressibility, respiratory phasicity and response to augmentation.  Popliteal Vein: No evidence of thrombus. Normal compressibility, respiratory phasicity and response to augmentation.  Calf Veins:  No evidence of thrombus. Normal compressibility and flow on color Doppler imaging.  Superficial Great Saphenous Vein: No evidence of thrombus. Normal compressibility and flow on color Doppler imaging.  Venous Reflux:  Not evaluated.  Other Findings:  None.  LEFT LOWER EXTREMITY  Common Femoral Vein: No evidence of thrombus. Normal compressibility, respiratory phasicity and response to augmentation.  Saphenofemoral Junction: No evidence of thrombus. Normal compressibility and flow on color Doppler imaging.  Profunda Femoral Vein: No evidence of thrombus. Normal compressibility and flow on color Doppler imaging.  Femoral Vein: No evidence of thrombus. Normal compressibility, respiratory phasicity and response to augmentation.  Popliteal Vein: No evidence of thrombus. Normal compressibility, respiratory phasicity and response to augmentation.  Calf Veins: No evidence of thrombus. Normal compressibility and flow on color Doppler imaging.  Superficial Great Saphenous Vein: No evidence of thrombus. Normal compressibility and flow  on color Doppler imaging.  Venous Reflux:  Not evaluated.  Other Findings:  None.  IMPRESSION: No evidence of DVT or superficial thrombophlebitis involving either the right or left lower extremity.   Electronically Signed   By: Evangeline Dakin M.D.   On: 03/25/2014 14:28   Dg Chest Port 1 View  03/24/2014   CLINICAL DATA:  Chest pain.  Abdominal pain.  Nausea and vomiting.  EXAM: PORTABLE CHEST - 1 VIEW  COMPARISON:  None.  FINDINGS: Cardiac silhouette upper normal in size to slightly enlarged for AP portable technique. Suboptimal inspiration due to body habitus accounts for crowded bronchovascular markings diffusely. Taking this into account, lungs clear. No pleural effusions.  IMPRESSION: Borderline to mild cardiomegaly. Suboptimal inspiration. No acute cardiopulmonary disease.   Electronically Signed   By: Evangeline Dakin M.D.   On: 03/24/2014 13:51    Microbiology: No results found  for this or any previous visit (from the past 240 hour(s)).   Labs: Basic Metabolic Panel:  Recent Labs Lab 03/24/14 0254 03/24/14 0612 03/25/14 0539  NA 140 141 140  K 3.1* 3.5* 4.2  CL 96 99 104  CO2 30 28 27   GLUCOSE 145* 156* 123*  BUN 17 15 9   CREATININE 1.17* 1.02 0.96  CALCIUM 9.2 8.7 8.6   Liver Function Tests:  Recent Labs Lab 03/24/14 0254 03/24/14 0612 03/25/14 0539  AST 15 13 12   ALT 9 8 7   ALKPHOS 107 99 83  BILITOT 0.4 0.3 0.3  PROT 8.1 7.2 6.7  ALBUMIN 3.6 3.2* 2.8*    Recent Labs Lab 03/24/14 0254  LIPASE 14   No results found for this basename: AMMONIA,  in the last 168 hours CBC:  Recent Labs Lab 03/24/14 0254 03/24/14 0612 03/25/14 0539 03/26/14 0558  WBC 13.2* 14.2* 7.4 6.8  NEUTROABS 10.6*  --   --   --   HGB 13.0 12.2 10.4* 10.7*  HCT 38.3 37.5 32.6* 33.2*  MCV 81.0 82.1 83.8 83.4  PLT 386 381 359 377   Cardiac Enzymes:  Recent Labs Lab 03/24/14 1327 03/24/14 1918 03/25/14 0102  TROPONINI <0.30 <0.30 <0.30   BNP: BNP (last 3 results) No results found for this basename: PROBNP,  in the last 8760 hours CBG:  Recent Labs Lab 03/26/14 1650 03/26/14 2041 03/27/14 0728 03/27/14 1126 03/27/14 1640  GLUCAP 93 100* 103* 114* 140*       Signed:  Zyren Sevigny  [facility] 03/27/2014, 5:54 PM

## 2014-03-27 NOTE — Progress Notes (Signed)
REVIEWED-NO ADDITIONAL RECOMMENDATIONS. 

## 2014-03-27 NOTE — Progress Notes (Signed)
Subjective: Abdominal pain much improved, now a "2" out of 10. Upper abdomen. N/V resolved. Didn't like lunch but ate dinner.   Objective: Vital signs in last 24 hours: Temp:  [98 F (36.7 C)-98.9 F (37.2 C)] 98 F (36.7 C) (09/22 0635) Pulse Rate:  [57-62] 57 (09/22 0635) Resp:  [18-20] 20 (09/22 0635) BP: (133-171)/(59-73) 133/73 mmHg (09/22 0635) SpO2:  [96 %-98 %] 98 % (09/22 0635) Last BM Date: 03/24/14 General:   Alert and oriented, pleasant Head:  Normocephalic and atraumatic. Eyes:  No icterus, sclera clear. Conjuctiva pink.  Abdomen:  Bowel sounds present, soft, very mild TTP upper abdomen, non-distended. Limited exam with patient sitting up in chair Extremities:  Without edema. Neurologic:  Alert and  oriented x4;  grossly normal neurologically. Psych:  Alert and cooperative. Normal mood and affect.  Intake/Output from previous day: 09/21 0701 - 09/22 0700 In: 2335 [P.O.:720; I.V.:1215; IV Piggyback:400] Out: -  Intake/Output this shift:    Lab Results:  Recent Labs  03/25/14 0539 03/26/14 0558  WBC 7.4 6.8  HGB 10.4* 10.7*  HCT 32.6* 33.2*  PLT 359 377   BMET  Recent Labs  03/25/14 0539  NA 140  K 4.2  CL 104  CO2 27  GLUCOSE 123*  BUN 9  CREATININE 0.96  CALCIUM 8.6   LFT  Recent Labs  03/25/14 0539  PROT 6.7  ALBUMIN 2.8*  AST 12  ALT 7  ALKPHOS 83  BILITOT 0.3     Studies/Results: Ct Angio Chest Pe W/cm &/or Wo Cm  03/25/2014   CLINICAL DATA:  Shortness of breath.  Chest pain.  Elevated D-dimer.  EXAM: CT ANGIOGRAPHY CHEST WITH CONTRAST  TECHNIQUE: Multidetector CT imaging of the chest was performed using the standard protocol during bolus administration of intravenous contrast. Multiplanar CT image reconstructions and MIPs were obtained to evaluate the vascular anatomy.  CONTRAST:  168mL OMNIPAQUE IOHEXOL 350 MG/ML SOLN  COMPARISON:  Chest radiograph 03/24/2014  FINDINGS: Evaluation limited secondary to motion artifact.  Within the above limitations, no definite evidence for pulmonary embolism.  Small peripherally calcified nodule within the right lobe of thyroid measuring 11 mm. No enlarged axillary, mediastinal or hilar lymphadenopathy. Normal heart size. No pericardial effusion.  Central airways are patent. There is a small right pleural effusion. Subpleural consolidative opacity within the right lower lobe is favored to represent atelectasis. No pneumothorax.  Limited visualization of the upper abdomen demonstrates no focal abnormality. No aggressive or acute appearing osseous lesions.  Review of the MIP images confirms the above findings.  IMPRESSION: Examination limited secondary to motion artifact. Within the above limitations no definite evidence for pulmonary embolism.  Small right pleural effusion and underlying consolidative opacities within the right lower lobe suggestive of atelectasis.   Electronically Signed   By: Lovey Newcomer M.D.   On: 03/25/2014 13:16   US Venous Img Lower Bilateral  03/25/2014   CLINICAL DATA:  Unexplained elevated D-dimer.  EXAM: BILATERAL LOWER EXTREMITY VENOUS DOPPLER ULTRASOUND  TECHNIQUE: Gray-scale sonography with graded compression, as well as color Doppler and duplex ultrasound were performed to evaluate the lower extremity deep venous systems from the level of the common femoral vein and including the common femoral, femoral, profunda femoral, popliteal and calf veins including the posterior tibial, peroneal and gastrocnemius veins when visible. The superficial great saphenous vein was also interrogated. Spectral Doppler was utilized to evaluate flow at rest and with distal augmentation maneuvers in the common femoral, femoral and popliteal  veins.  COMPARISON:  None.  FINDINGS: RIGHT LOWER EXTREMITY  Common Femoral Vein: No evidence of thrombus. Normal compressibility, respiratory phasicity and response to augmentation.  Saphenofemoral Junction: No evidence of thrombus. Normal  compressibility and flow on color Doppler imaging.  Profunda Femoral Vein: No evidence of thrombus. Normal compressibility and flow on color Doppler imaging.  Femoral Vein: No evidence of thrombus. Normal compressibility, respiratory phasicity and response to augmentation.  Popliteal Vein: No evidence of thrombus. Normal compressibility, respiratory phasicity and response to augmentation.  Calf Veins: No evidence of thrombus. Normal compressibility and flow on color Doppler imaging.  Superficial Great Saphenous Vein: No evidence of thrombus. Normal compressibility and flow on color Doppler imaging.  Venous Reflux:  Not evaluated.  Other Findings:  None.  LEFT LOWER EXTREMITY  Common Femoral Vein: No evidence of thrombus. Normal compressibility, respiratory phasicity and response to augmentation.  Saphenofemoral Junction: No evidence of thrombus. Normal compressibility and flow on color Doppler imaging.  Profunda Femoral Vein: No evidence of thrombus. Normal compressibility and flow on color Doppler imaging.  Femoral Vein: No evidence of thrombus. Normal compressibility, respiratory phasicity and response to augmentation.  Popliteal Vein: No evidence of thrombus. Normal compressibility, respiratory phasicity and response to augmentation.  Calf Veins: No evidence of thrombus. Normal compressibility and flow on color Doppler imaging.  Superficial Great Saphenous Vein: No evidence of thrombus. Normal compressibility and flow on color Doppler imaging.  Venous Reflux:  Not evaluated.  Other Findings:  None.  IMPRESSION: No evidence of DVT or superficial thrombophlebitis involving either the right or left lower extremity.   Electronically Signed   By: Evangeline Dakin M.D.   On: 03/25/2014 14:28    Assessment: 63 year old female admitted with abdominal pain, N/V secondary to ileitis as noted on CT. Abdominal pain significantly  Improved; N/V resolved with cessation of scheduled Dilaudid. Tolerating diet. Stable from a  GI standpoint for discharge when medically appropriate.   Normocytic anemia: iron and ferritin normal. Multifactorial.    Plan: PPI BID Scheduled Zofran Change Cipro IV to po Low-fat diet Repeat CT or CT enterography in 4-6 weeks to document resolution Will follow peripherally   Orvil Feil, ANP-BC [facility]    LOS: 3 days    03/27/2014, 7:44 AM

## 2014-03-27 NOTE — Progress Notes (Signed)
Discharge instructions given on medications,and follow up visits,patient verbalized understanding.Prescriptions sent with patient.Accompanied by staff to an awaiting vehicle. 

## 2014-03-28 ENCOUNTER — Other Ambulatory Visit: Payer: Self-pay | Admitting: *Deleted

## 2014-03-28 ENCOUNTER — Encounter (HOSPITAL_COMMUNITY): Payer: Self-pay | Admitting: Internal Medicine

## 2014-03-28 ENCOUNTER — Telehealth: Payer: Self-pay | Admitting: Emergency Medicine

## 2014-03-28 ENCOUNTER — Telehealth: Payer: Self-pay | Admitting: Family Medicine

## 2014-03-28 MED ORDER — ONDANSETRON HCL 4 MG PO TABS: 4.0000 mg | ORAL_TABLET | Freq: Three times a day (TID) | ORAL | Status: DC | PRN

## 2014-03-28 MED ORDER — PANTOPRAZOLE SODIUM 40 MG PO TBEC: 40.0000 mg | DELAYED_RELEASE_TABLET | Freq: Every day | ORAL | Status: DC

## 2014-03-28 NOTE — Telephone Encounter (Signed)
Patient has called in today to see if she can get an alternative form of her medications so that she is able to afford them; please f/u with patient about the details of the medications she is asking about; patient did not know the names of the medications;

## 2014-03-28 NOTE — Telephone Encounter (Signed)
Spoke to pt regarding medication changes prescribed from [facility] s/p discharge. Pt does not have any insurance coverage and OC pending Instructed pt to come here for assistance

## 2014-04-02 ENCOUNTER — Encounter: Payer: Self-pay | Admitting: Family Medicine

## 2014-04-02 ENCOUNTER — Ambulatory Visit: Payer: Self-pay | Attending: Family Medicine | Admitting: Family Medicine

## 2014-04-02 ENCOUNTER — Other Ambulatory Visit (INDEPENDENT_AMBULATORY_CARE_PROVIDER_SITE_OTHER): Payer: Self-pay | Admitting: General Surgery

## 2014-04-02 VITALS — BP 130/77 | HR 76 | Temp 98.7°F | Resp 18 | Ht 63.0 in | Wt 229.0 lb

## 2014-04-02 DIAGNOSIS — Z79899 Other long term (current) drug therapy: Secondary | ICD-10-CM | POA: Insufficient documentation

## 2014-04-02 DIAGNOSIS — A09 Infectious gastroenteritis and colitis, unspecified: Secondary | ICD-10-CM | POA: Insufficient documentation

## 2014-04-02 DIAGNOSIS — K529 Noninfective gastroenteritis and colitis, unspecified: Secondary | ICD-10-CM

## 2014-04-02 DIAGNOSIS — K5900 Constipation, unspecified: Secondary | ICD-10-CM | POA: Insufficient documentation

## 2014-04-02 DIAGNOSIS — E669 Obesity, unspecified: Secondary | ICD-10-CM | POA: Insufficient documentation

## 2014-04-02 DIAGNOSIS — Z6841 Body Mass Index (BMI) 40.0 and over, adult: Secondary | ICD-10-CM | POA: Insufficient documentation

## 2014-04-02 DIAGNOSIS — Z7982 Long term (current) use of aspirin: Secondary | ICD-10-CM | POA: Insufficient documentation

## 2014-04-02 DIAGNOSIS — K5289 Other specified noninfective gastroenteritis and colitis: Secondary | ICD-10-CM

## 2014-04-02 MED ORDER — DOCUSATE SODIUM 100 MG PO CAPS: 100.0000 mg | ORAL_CAPSULE | Freq: Two times a day (BID) | ORAL | Status: DC | PRN

## 2014-04-02 NOTE — Patient Instructions (Addendum)
Ms. Oltmann,  Thank you for coming in to see me today. I am happy that you are feeling better.  1 new Rx is colace to help with constipation while taking pain medicine. Finish cipro.    You do have some mild anemia and you are due for screening colonscopy we will work on getting this and your mammogram done in the near future.  For mammogram,  Please call Rolena Infante, [phone],  with the BCCCP (breast and cervical cancer control program) at the [facility] to set up an appointment to verify eligibility for a breast exam, mammogram, ultrasound. If you qualify this will be set up at [facility].  Return in 2 months.   Dr. Adrian Blackwater

## 2014-04-02 NOTE — Progress Notes (Signed)
HFU Stated not in pain since last visit

## 2014-04-02 NOTE — Progress Notes (Signed)
   Subjective:    Patient ID: Sandra [name], female    DOB: [birth year], 63 y.o.   MRN: [id number] CC: HFU ileitis  HPI  63 yo F recently hospitalized with ileitis. Taking cipro now. Some constipation, also taking vicodin. No fever, chills, abdominal pain, diarrhea, blood in stools.   Soc hx: non smoker  Review of Systems As per HPI     Objective:   Physical Exam BP 130/77  Pulse 76  Temp(Src) 98.7 F (37.1 C) (Oral)  Resp 18  Ht 5\' 3"  (1.6 m)  Wt 229 lb (103.874 kg)  BMI 40.58 kg/m2  SpO2 94% General appearance: alert, cooperative and no distress Lungs: clear to auscultation bilaterally Heart: regular rate and rhythm, S1, S2 normal, no murmur, click, rub or gallop Abdomen: obese,  soft, non-tender; bowel sounds normal; no masses,  no organomegaly       Assessment & Plan:

## 2014-04-02 NOTE — Assessment & Plan Note (Signed)
A: IMPROVING P: Finish cipro  Colace for constipation

## 2014-04-02 NOTE — H&P (Signed)
Liberal Stogdill 04/02/2014 1:59 PM Location: [facility] Patient #: [id number] DOB: [birth year] Divorced / Language: Sandra [name] / Race: Black or African American Female  History of Present Illness Sandra [name]; 04/02/2014 2:27 PM) Patient words: temporal arterial biopsy.  The patient is a 63 year old female who presents with a complaint of headaches. Pt has had daily headaches for quite a while. She thinks it has been over 6 months. She states that it usually occurs on top of her head. it sometimes goes to one side, but primarily feels like "a brick hit her on top of the head." She has tried tylenol and ibuprofen which have not worked. it has started to improve since she took several rounds of antibiotics. She was recently admitted to the hospital for GI issues.    Other Problems ([name], [place]; 04/02/2014 2:00 PM) Arthritis Asthma Bladder Problems Chest pain Hemorrhoids High blood pressure Hypercholesterolemia Migraine Headache Sleep Apnea  Past Surgical History ([place], RMA; 04/02/2014 2:00 PM) Hysterectomy (not due to cancer) - Partial Tonsillectomy  Diagnostic Studies History ([place], [place]; 04/02/2014 2:00 PM) Colonoscopy never Mammogram 1-3 years ago Pap Smear 1-5 years ago  Allergies ([place], [place]; 04/02/2014 2:02 PM) Penicillins Sulfa Drugs Lisinopril *ANTIHYPERTENSIVES*  Medication History ([place], RMA; 04/02/2014 2:05 PM) Aspirin (81MG  Tablet, 1 (one) Oral) Active. Lipitor (10MG  Tablet, Oral) Active. Cipro (500MG  Tablet, Oral) Active. Hydrochlorothiazide (25MG  Tablet, Oral) Active. Metoprolol Tartrate (25MG  Tablet, Oral) Active. Zofran (4MG  Tablet, Oral) Active. Protonix (40MG  Packet, Oral) Active. Zantac (150MG  Tablet, Oral) Active. Medications Reconciled  Social History [name] [place], [place]; 04/02/2014 2:00 PM) No alcohol use No drug  use Tobacco use Never smoker.  Family History ([place], [place]; 04/02/2014 2:00 PM) Alcohol Abuse Family Members In General. Anesthetic complications Family Members In General. Arthritis Mother, Sister. Bleeding disorder Family Members In General. Cancer Family Members In General. Cerebrovascular Accident Family Members In General. Cervical Cancer Daughter. Colon Cancer Family Members In General. Colon Polyps Family Members In General. Depression Family Members In General. Diabetes Mellitus Mother, Sister. Hypertension Brother, Mother, Sister. Ischemic Bowel Disease Family Members In General. Malignant Neoplasm Of Pancreas Family Members In General. Melanoma Family Members In General. Migraine Headache Daughter. Ovarian Cancer Family Members In General. Prostate Cancer Family Members In General. Rectal Cancer Family Members In General. Respiratory Condition Daughter. Seizure disorder Son. Thyroid problems Family Members In General, Mother.  Pregnancy / Birth History [name], [place]; 04/02/2014 2:00 PM) Age at menarche 69 years. Age of menopause 25-50 Gravida 3 Maternal age 25-25 Para 3 Regular periods  Review of Systems ([place] RMA; 04/02/2014 2:00 PM) General Present- Fatigue and Weight Gain. Not Present- Appetite Loss, Chills, Fever, Night Sweats and Weight Loss. Skin Present- Change in Wart/Mole. Not Present- Dryness, Hives, Jaundice, New Lesions, Non-Healing Wounds, Rash and Ulcer. HEENT Present- Earache. Not Present- Hearing Loss, Hoarseness, Nose Bleed, Oral Ulcers, Ringing in the Ears, Seasonal Allergies, Sinus Pain, Sore Throat, Visual Disturbances, Wears glasses/contact lenses and Yellow Eyes. Respiratory Present- Wheezing. Not Present- Bloody sputum, Chronic Cough, Difficulty Breathing and Snoring. Breast Present- Breast Mass. Not Present- Breast Pain, Nipple Discharge and Skin Changes. Cardiovascular  Present- Chest Pain and Leg Cramps. Not Present- Difficulty Breathing Lying Down, Palpitations, Rapid Heart Rate, Shortness of Breath and Swelling of Extremities. Gastrointestinal Present- Abdominal Pain, Hemorrhoids and Indigestion. Not Present- Bloating, Bloody Stool, Change in Bowel Habits, Chronic diarrhea, Constipation, Difficulty Swallowing, Excessive gas, Gets full quickly at meals, Nausea, Rectal Pain and Vomiting. Musculoskeletal Present-  Joint Pain. Not Present- Back Pain, Joint Stiffness, Muscle Pain, Muscle Weakness and Swelling of Extremities. Neurological Present- Headaches, Numbness and Tingling. Not Present- Decreased Memory, Fainting, Seizures, Tremor, Trouble walking and Weakness. Psychiatric Not Present- Anxiety, Bipolar, Change in Sleep Pattern, Depression, Fearful and Frequent crying. Endocrine Present- Excessive Hunger and Heat Intolerance. Not Present- Cold Intolerance, Hair Changes, Hot flashes and New Diabetes. Hematology Present- Persistent Infections. Not Present- Easy Bruising, Excessive bleeding, Gland problems and HIV.   Vitals ([place] RMA; 04/02/2014 2:01 PM) 04/02/2014 2:00 PM Weight: 227.4 lb Height: 63in Body Surface Area: 2.14 m Body Mass Index: 40.28 kg/m Temp.: 98.29F(Oral)  Pulse: 74 (Regular)  BP: 128/68 (Sitting, Left Arm, Standard)    Physical Exam Sandra [name]; 04/02/2014 2:28 PM) General Mental Status-Alert. General Appearance-Consistent with stated age. Hydration-Well hydrated. Voice-Normal.  Head and Neck Head-normocephalic, atraumatic with no lesions or palpable masses. Note: palpable temporal arterial pulse bilaterally.   Eye Sclera/Conjunctiva - Bilateral-No scleral icterus.  Chest and Lung Exam Chest and lung exam reveals -quiet, even and easy respiratory effort with no use of accessory muscles. Inspection Chest Wall - Normal. Back - normal.  Breast - Did not  examine.  Cardiovascular Cardiovascular examination reveals -normal pedal pulses bilaterally. Note: regular rate and rhythm  Abdomen Inspection-Inspection Normal. Palpation/Percussion Palpation and Percussion of the abdomen reveal - Soft, Non Tender, No Rebound tenderness, No Rigidity (guarding) and No hepatosplenomegaly. Auscultation Auscultation of the abdomen reveals - Bowel sounds normal.  Peripheral Vascular Upper Extremity Inspection - Bilateral - Normal - No Clubbing, No Cyanosis, No Edema, Pulses Intact. Lower Extremity Palpation - Edema - Bilateral - No edema.  Neurologic Neurologic evaluation reveals -alert and oriented x 3 with no impairment of recent or remote memory. Mental Status-Normal.  Musculoskeletal Global Assessment -Note: no gross deformities.  Normal Exam - Left-Upper Extremity Strength Normal and Lower Extremity Strength Normal. Normal Exam - Right-Upper Extremity Strength Normal and Lower Extremity Strength Normal.  Lymphatic Head & Neck  General Head & Neck Lymphatics: Bilateral - Description - Normal. Axillary  General Axillary Region: Bilateral - Description - Normal. Tenderness - Non Tender.    Assessment & Plan Sandra [name]; 04/02/2014 2:32 PM) CHRONIC DAILY HEADACHE (784.0  R51) Impression: We will schedule her for a temporal artery biopsy. If negative, she may benefit from a neurology consult. I discussed risks and benefits including bleeding, infection, damage to the facial nerve. I advised her that we will need to shave a small amount of hair. Current Plans  Schedule for Surgery Main risks of surgery include bleeding, infection, damage to adjacent structures.  We will shave a small amount of hair. This is an outpatient procedure.   Signed by Sandra Klein, [name] (04/02/2014 2:34 PM)

## 2014-04-05 ENCOUNTER — Telehealth: Payer: Self-pay | Admitting: *Deleted

## 2014-04-05 ENCOUNTER — Telehealth: Payer: Self-pay

## 2014-04-05 DIAGNOSIS — G44029 Chronic cluster headache, not intractable: Secondary | ICD-10-CM

## 2014-04-05 NOTE — Telephone Encounter (Signed)
Pt will like to schedule a CT of the head before schedule surgery

## 2014-04-09 ENCOUNTER — Telehealth: Payer: Self-pay | Admitting: Family Medicine

## 2014-04-09 ENCOUNTER — Other Ambulatory Visit: Payer: Self-pay | Admitting: Family Medicine

## 2014-04-09 NOTE — Telephone Encounter (Signed)
Pt. Would like a refill for ondansetron (ZOFRAN) 4 MG tablet. Pt. States that her stomach is still upset. Please f/u with pt.

## 2014-04-10 ENCOUNTER — Telehealth: Payer: Self-pay | Admitting: Emergency Medicine

## 2014-04-10 MED ORDER — ONDANSETRON HCL 4 MG PO TABS: 4.0000 mg | ORAL_TABLET | Freq: Three times a day (TID) | ORAL | Status: DC | PRN

## 2014-04-10 NOTE — Telephone Encounter (Signed)
Pt requesting medication refill Zofran. Can I refill? Please f/u

## 2014-04-10 NOTE — Telephone Encounter (Signed)
Pt aware medication Zofran reordered and e-scribed to [facility]

## 2014-04-10 NOTE — Telephone Encounter (Signed)
CT head ordered. Please scheduled and inform patient of appt details.

## 2014-04-10 NOTE — Telephone Encounter (Signed)
I sent in a refill. Please inform patient.

## 2014-04-11 NOTE — Telephone Encounter (Signed)
Ct Head schedule on Friday 04/13/2014 at 9:00 arriving 8:45 Pt notified

## 2014-04-13 ENCOUNTER — Telehealth: Payer: Self-pay | Admitting: *Deleted

## 2014-04-13 ENCOUNTER — Ambulatory Visit (HOSPITAL_COMMUNITY)
Admission: RE | Admit: 2014-04-13 | Discharge: 2014-04-13 | Disposition: A | Discharge status: Home / Self Care | Payer: MEDICAID | Source: Ambulatory Visit | Attending: Family Medicine | Admitting: Family Medicine

## 2014-04-13 DIAGNOSIS — G44029 Chronic cluster headache, not intractable: Secondary | ICD-10-CM | POA: Insufficient documentation

## 2014-04-13 NOTE — Telephone Encounter (Signed)
Message copied by Betti Cruz on Fri Apr 13, 2014 12:45 PM ------      Message from: Boykin Nearing      Created: Fri Apr 13, 2014 12:04 PM       Normal CT head.       If patient decided to have temporal artery biopsy she should f/u with general surgery ------

## 2014-04-13 NOTE — Telephone Encounter (Signed)
Pt aware of CT result, stated  will postpone the biopsy

## 2014-09-05 ENCOUNTER — Other Ambulatory Visit: Payer: Self-pay | Admitting: *Deleted

## 2014-09-05 ENCOUNTER — Other Ambulatory Visit: Payer: Self-pay | Admitting: Family Medicine

## 2014-09-05 MED ORDER — PANTOPRAZOLE SODIUM 40 MG PO TBEC: 40.0000 mg | DELAYED_RELEASE_TABLET | Freq: Every day | ORAL | Status: DC

## 2014-09-05 NOTE — Telephone Encounter (Signed)
Pt requesting Refill Rx Protonix, refill send to [facility] Pt advised to schedule F/U appointment with PCP for future refills

## 2014-10-01 ENCOUNTER — Ambulatory Visit: Payer: Self-pay | Admitting: Family Medicine

## 2014-11-22 ENCOUNTER — Telehealth: Payer: Self-pay | Admitting: *Deleted

## 2014-11-22 DIAGNOSIS — I10 Essential (primary) hypertension: Secondary | ICD-10-CM

## 2014-11-22 MED ORDER — METOPROLOL TARTRATE 25 MG PO TABS: 25.0000 mg | ORAL_TABLET | Freq: Two times a day (BID) | ORAL | Status: DC

## 2014-11-22 MED ORDER — HYDROCHLOROTHIAZIDE 25 MG PO TABS: 25.0000 mg | ORAL_TABLET | Freq: Every day | ORAL | Status: AC

## 2014-11-22 NOTE — Telephone Encounter (Signed)
Please call back to patient I have sent in her BP medicine with 2 refills, 3 month supply to [facility] records she can do one of the following: 1. Walk in to sign a release for records which will be handled by the front office staff 2. Sign and release at her new PCP office and ask that it be faxed to [place].

## 2014-11-22 NOTE — Telephone Encounter (Signed)
Called patient to let her know Dr. Adrian Blackwater sent her refills to [facility] and that she she sign a medical release form at her new PCP office and once we receive that we will fax her medical records over.  Patient appreciative.

## 2014-11-22 NOTE — Telephone Encounter (Signed)
Patient left voice mail message that she would like a refill on her blood pressure medication and that she was trying to establish care with another PCP closer to her home and she needs [place] to fax medical records for her.  Patient has not been seen by Dr. Adrian Blackwater since 03/2014.  Routing to PCP

## 2014-11-29 ENCOUNTER — Encounter (HOSPITAL_COMMUNITY): Payer: Self-pay

## 2014-11-29 ENCOUNTER — Other Ambulatory Visit: Payer: Self-pay

## 2014-11-29 ENCOUNTER — Encounter (HOSPITAL_COMMUNITY)
Admission: RE | Admit: 2014-11-29 | Discharge: 2014-11-29 | Disposition: A | Discharge status: Home / Self Care | Payer: Medicare Other | Source: Ambulatory Visit | Attending: Ophthalmology | Admitting: Ophthalmology

## 2014-11-29 DIAGNOSIS — Z01818 Encounter for other preprocedural examination: Secondary | ICD-10-CM | POA: Insufficient documentation

## 2014-11-29 HISTORY — DX: Unspecified osteoarthritis, unspecified site: M19.90

## 2014-11-29 HISTORY — DX: Anemia, unspecified: D64.9

## 2014-11-29 HISTORY — DX: Sleep apnea, unspecified: G47.30

## 2014-11-29 LAB — CBC WITH DIFFERENTIAL/PLATELET
BASOS PCT: 1 % (ref 0–1)
Basophils Absolute: 0 10*3/uL (ref 0.0–0.1)
EOS ABS: 0.2 10*3/uL (ref 0.0–0.7)
EOS PCT: 3 % (ref 0–5)
HEMATOCRIT: 37.3 % (ref 36.0–46.0)
Hemoglobin: 12 g/dL (ref 12.0–15.0)
Lymphocytes Relative: 31 % (ref 12–46)
Lymphs Abs: 2.1 10*3/uL (ref 0.7–4.0)
MCH: 26.5 pg (ref 26.0–34.0)
MCHC: 32.2 g/dL (ref 30.0–36.0)
MCV: 82.3 fL (ref 78.0–100.0)
MONO ABS: 0.3 10*3/uL (ref 0.1–1.0)
Monocytes Relative: 5 % (ref 3–12)
NEUTROS ABS: 4.1 10*3/uL (ref 1.7–7.7)
Neutrophils Relative %: 60 % (ref 43–77)
Platelets: 348 10*3/uL (ref 150–400)
RBC: 4.53 MIL/uL (ref 3.87–5.11)
RDW: 14.9 % (ref 11.5–15.5)
WBC: 6.7 10*3/uL (ref 4.0–10.5)

## 2014-11-29 LAB — BASIC METABOLIC PANEL
Anion gap: 8 (ref 5–15)
BUN: 19 mg/dL (ref 6–20)
CHLORIDE: 100 mmol/L — AB (ref 101–111)
CO2: 28 mmol/L (ref 22–32)
Calcium: 9.2 mg/dL (ref 8.9–10.3)
Creatinine, Ser: 0.88 mg/dL (ref 0.44–1.00)
GFR calc Af Amer: >60 mL/min (ref >60)
Glucose, Bld: 137 mg/dL — ABNORMAL HIGH (ref 65–99)
Potassium: 3.9 mmol/L (ref 3.5–5.1)
SODIUM: 136 mmol/L (ref 135–145)

## 2014-11-29 NOTE — Patient Instructions (Signed)
Your procedure is scheduled on: 12/04/2014  Report to [facility] at  1000  AM.  Call this number if you have problems the morning of surgery: [phone]   Do not eat food or drink liquids :After Midnight.      Take these medicines the morning of surgery with A SIP OF WATER: hydrodiuril, hydrocodone, metoprolol, zofran, protonix, zantac.  Do not wear jewelry, make-up or nail polish.  Do not wear lotions, powders, or perfumes.   Do not shave 48 hours prior to surgery.  Do not bring valuables to the hospital.  Contacts, dentures or bridgework may not be worn into surgery.  Leave suitcase in the car. After surgery it may be brought to your room.  For patients admitted to the hospital, checkout time is 11:00 AM the day of discharge.   Patients discharged the day of surgery will not be allowed to drive home.  :     Please read over the following fact sheets that you were given: Coughing and Deep Breathing, Surgical Site Infection Prevention, Anesthesia Post-op Instructions and Care and Recovery After Surgery    Cataract A cataract is a clouding of the lens of the eye. When a lens becomes cloudy, vision is reduced based on the degree and nature of the clouding. Many cataracts reduce vision to some degree. Some cataracts make people more near-sighted as they develop. Other cataracts increase glare. Cataracts that are ignored and become worse can sometimes look white. The white color can be seen through the pupil. CAUSES   Aging. However, cataracts may occur at any age, even in newborns.   Certain drugs.   Trauma to the eye.   Certain diseases such as diabetes.   Specific eye diseases such as chronic inflammation inside the eye or a sudden attack of a rare form of glaucoma.   Inherited or acquired medical problems.  SYMPTOMS   Gradual, progressive drop in vision in the affected eye.   Severe, rapid visual loss. This most often happens when trauma is the cause.  DIAGNOSIS  To detect a  cataract, an eye doctor examines the lens. Cataracts are best diagnosed with an exam of the eyes with the pupils enlarged (dilated) by drops.  TREATMENT  For an early cataract, vision may improve by using different eyeglasses or stronger lighting. If that does not help your vision, surgery is the only effective treatment. A cataract needs to be surgically removed when vision loss interferes with your everyday activities, such as driving, reading, or watching TV. A cataract may also have to be removed if it prevents examination or treatment of another eye problem. Surgery removes the cloudy lens and usually replaces it with a substitute lens (intraocular lens, IOL).  At a time when both you and your doctor agree, the cataract will be surgically removed. If you have cataracts in both eyes, only one is usually removed at a time. This allows the operated eye to heal and be out of danger from any possible problems after surgery (such as infection or poor wound healing). In rare cases, a cataract may be doing damage to your eye. In these cases, your caregiver may advise surgical removal right away. The vast majority of people who have cataract surgery have better vision afterward. HOME CARE INSTRUCTIONS  If you are not planning surgery, you may be asked to do the following:  Use different eyeglasses.   Use stronger or brighter lighting.   Ask your eye doctor about reducing your medicine dose  or changing medicines if it is thought that a medicine caused your cataract. Changing medicines does not make the cataract go away on its own.   Become familiar with your surroundings. Poor vision can lead to injury. Avoid bumping into things on the affected side. You are at a higher risk for tripping or falling.   Exercise extreme care when driving or operating machinery.   Wear sunglasses if you are sensitive to bright light or experiencing problems with glare.  SEEK IMMEDIATE MEDICAL CARE IF:   You have a  worsening or sudden vision loss.   You notice redness, swelling, or increasing pain in the eye.   You have a fever.  Document Released: 06/22/2005 Document Revised: 06/11/2011 Document Reviewed: 02/13/2011 [facility] Patient Information 2012 [place].PATIENT INSTRUCTIONS POST-ANESTHESIA  IMMEDIATELY FOLLOWING SURGERY:  Do not drive or operate machinery for the first twenty four hours after surgery.  Do not make any important decisions for twenty four hours after surgery or while taking narcotic pain medications or sedatives.  If you develop intractable nausea and vomiting or a severe headache please notify your doctor immediately.  FOLLOW-UP:  Please make an appointment with your surgeon as instructed. You do not need to follow up with anesthesia unless specifically instructed to do so.  WOUND CARE INSTRUCTIONS (if applicable):  Keep a dry clean dressing on the anesthesia/puncture wound site if there is drainage.  Once the wound has quit draining you may leave it open to air.  Generally you should leave the bandage intact for twenty four hours unless there is drainage.  If the epidural site drains for more than 36-48 hours please call the anesthesia department.  QUESTIONS?:  Please feel free to call your physician or the hospital operator if you have any questions, and they will be happy to assist you.

## 2014-11-29 NOTE — Pre-Procedure Instructions (Signed)
Patient in for PAT, audible wheezes present. Respirations 22, SOB. Lungs auscultated and bilateral upper lobe wheezing noted. Patient states that this started about 1 month ago. Has dry non-productive cough at present. Patient states that her cough is productive at times but it is clear. She is afebrile. When continuing interview, patient states that she has been out of her protonix for about 1 month also. Instructed patient that she needs to go see her PCP, that these two problems could be interlinked. Instructed her that there is a possibility that her surgery could be cancelled pending what Dr Karie Kirks and Dr Patsey Berthold say about her current medical problems. She verbalizes understanding of this and is going to Dr Vickey Sages office when she leaves here. Dr Patsey Berthold notified of above. No orders given.

## 2014-11-29 NOTE — Pre-Procedure Instructions (Signed)
Patient given information to sign up for my chart at home. 

## 2014-12-04 ENCOUNTER — Encounter (HOSPITAL_COMMUNITY): Admission: RE | Payer: Self-pay | Source: Ambulatory Visit

## 2014-12-04 ENCOUNTER — Other Ambulatory Visit: Payer: Self-pay | Admitting: *Deleted

## 2014-12-04 ENCOUNTER — Ambulatory Visit (HOSPITAL_COMMUNITY): Admission: RE | Admit: 2014-12-04 | Payer: Medicare Other | Source: Ambulatory Visit | Admitting: Ophthalmology

## 2014-12-04 DIAGNOSIS — K219 Gastro-esophageal reflux disease without esophagitis: Secondary | ICD-10-CM

## 2014-12-04 DIAGNOSIS — E7800 Pure hypercholesterolemia, unspecified: Secondary | ICD-10-CM

## 2014-12-04 SURGERY — PHACOEMULSIFICATION, CATARACT, WITH IOL INSERTION
Anesthesia: Monitor Anesthesia Care | Laterality: Right

## 2014-12-04 MED ORDER — PANTOPRAZOLE SODIUM 40 MG PO TBEC: 40.0000 mg | DELAYED_RELEASE_TABLET | Freq: Every day | ORAL | Status: DC

## 2014-12-04 MED ORDER — ATORVASTATIN CALCIUM 10 MG PO TABS: 10.0000 mg | ORAL_TABLET | Freq: Every day | ORAL | Status: AC

## 2014-12-04 NOTE — Telephone Encounter (Signed)
Patient called in asking for refill on her atorvastatin and her pantoprazole. She states she has a new PCP but cannot see them until July.  Gave one month supply and explained from now on she would have to get refills from new PCP.

## 2014-12-26 ENCOUNTER — Other Ambulatory Visit (HOSPITAL_COMMUNITY): Payer: Medicare Other

## 2014-12-27 ENCOUNTER — Other Ambulatory Visit (HOSPITAL_COMMUNITY): Payer: Self-pay

## 2014-12-27 MED ORDER — PHENYLEPHRINE HCL 2.5 % OP SOLN: 1.0000 [drp] | OPHTHALMIC | Status: AC

## 2014-12-27 MED ORDER — KETOROLAC TROMETHAMINE 0.5 % OP SOLN: 1.0000 [drp] | OPHTHALMIC | Status: AC

## 2014-12-27 MED ORDER — TETRACAINE HCL 0.5 % OP SOLN: 1.0000 [drp] | OPHTHALMIC | Status: AC

## 2014-12-27 MED ORDER — CYCLOPENTOLATE-PHENYLEPHRINE 0.2-1 % OP SOLN: 1.0000 [drp] | OPHTHALMIC | Status: AC

## 2014-12-28 ENCOUNTER — Encounter (HOSPITAL_COMMUNITY): Payer: Self-pay

## 2014-12-28 ENCOUNTER — Encounter (HOSPITAL_COMMUNITY)
Admission: RE | Admit: 2014-12-28 | Discharge: 2014-12-28 | Disposition: A | Discharge status: Home / Self Care | Payer: Medicare Other | Source: Ambulatory Visit | Attending: Ophthalmology | Admitting: Ophthalmology

## 2015-01-01 ENCOUNTER — Ambulatory Visit (HOSPITAL_COMMUNITY): Payer: Medicare Other | Admitting: Anesthesiology

## 2015-01-01 ENCOUNTER — Encounter (HOSPITAL_COMMUNITY)
Admission: RE | Disposition: A | Discharge status: Home / Self Care | Payer: Self-pay | Source: Ambulatory Visit | Attending: Ophthalmology

## 2015-01-01 ENCOUNTER — Encounter (HOSPITAL_COMMUNITY): Payer: Self-pay | Admitting: Emergency Medicine

## 2015-01-01 ENCOUNTER — Ambulatory Visit (HOSPITAL_COMMUNITY)
Admission: RE | Admit: 2015-01-01 | Discharge: 2015-01-01 | Disposition: A | Discharge status: Home / Self Care | Payer: Medicare Other | Source: Ambulatory Visit | Attending: Ophthalmology | Admitting: Ophthalmology

## 2015-01-01 DIAGNOSIS — G4730 Sleep apnea, unspecified: Secondary | ICD-10-CM | POA: Diagnosis not present

## 2015-01-01 DIAGNOSIS — E780 Pure hypercholesterolemia: Secondary | ICD-10-CM | POA: Diagnosis not present

## 2015-01-01 DIAGNOSIS — H2511 Age-related nuclear cataract, right eye: Secondary | ICD-10-CM | POA: Diagnosis present

## 2015-01-01 DIAGNOSIS — H25011 Cortical age-related cataract, right eye: Secondary | ICD-10-CM | POA: Diagnosis not present

## 2015-01-01 DIAGNOSIS — Z79899 Other long term (current) drug therapy: Secondary | ICD-10-CM | POA: Diagnosis not present

## 2015-01-01 DIAGNOSIS — I10 Essential (primary) hypertension: Secondary | ICD-10-CM | POA: Diagnosis not present

## 2015-01-01 DIAGNOSIS — Z7982 Long term (current) use of aspirin: Secondary | ICD-10-CM | POA: Diagnosis not present

## 2015-01-01 DIAGNOSIS — E119 Type 2 diabetes mellitus without complications: Secondary | ICD-10-CM | POA: Diagnosis not present

## 2015-01-01 DIAGNOSIS — K219 Gastro-esophageal reflux disease without esophagitis: Secondary | ICD-10-CM | POA: Diagnosis not present

## 2015-01-01 DIAGNOSIS — M1990 Unspecified osteoarthritis, unspecified site: Secondary | ICD-10-CM | POA: Diagnosis not present

## 2015-01-01 HISTORY — PX: CATARACT EXTRACTION W/PHACO: SHX586

## 2015-01-01 LAB — GLUCOSE, CAPILLARY: Glucose-Capillary: 103 mg/dL — ABNORMAL HIGH (ref 65–99)

## 2015-01-01 SURGERY — PHACOEMULSIFICATION, CATARACT, WITH IOL INSERTION
Anesthesia: Monitor Anesthesia Care | Laterality: Left

## 2015-01-01 SURGERY — PHACOEMULSIFICATION, CATARACT, WITH IOL INSERTION
Anesthesia: Monitor Anesthesia Care | Site: Eye | Laterality: Right

## 2015-01-01 MED ORDER — KETOROLAC TROMETHAMINE 0.5 % OP SOLN
1.0000 [drp] | OPHTHALMIC | Status: AC
  Administered 2015-01-01 (×3): 1 [drp] via OPHTHALMIC

## 2015-01-01 MED ORDER — CYCLOPENTOLATE-PHENYLEPHRINE 0.2-1 % OP SOLN
1.0000 [drp] | OPHTHALMIC | Status: AC
  Administered 2015-01-01 (×3): 1 [drp] via OPHTHALMIC

## 2015-01-01 MED ORDER — FENTANYL CITRATE (PF) 100 MCG/2ML IJ SOLN: 25.0000 ug | INTRAMUSCULAR | Status: DC | PRN

## 2015-01-01 MED ORDER — TETRACAINE HCL 0.5 % OP SOLN
1.0000 [drp] | OPHTHALMIC | Status: AC
  Administered 2015-01-01 (×2): 1 [drp] via OPHTHALMIC

## 2015-01-01 MED ORDER — LACTATED RINGERS IV SOLN
INTRAVENOUS | Status: DC
  Administered 2015-01-01: 75 mL/h via INTRAVENOUS

## 2015-01-01 MED ORDER — ONDANSETRON HCL 4 MG/2ML IJ SOLN: 4.0000 mg | Freq: Once | INTRAMUSCULAR | Status: DC | PRN

## 2015-01-01 MED ORDER — MIDAZOLAM HCL 2 MG/2ML IJ SOLN
1.0000 mg | INTRAMUSCULAR | Status: DC | PRN
  Administered 2015-01-01: 2 mg via INTRAVENOUS

## 2015-01-01 MED ORDER — TETRACAINE 0.5 % OP SOLN OPTIME - NO CHARGE
OPHTHALMIC | Status: DC | PRN
  Administered 2015-01-01: 2 [drp] via OPHTHALMIC

## 2015-01-01 MED ORDER — PHENYLEPHRINE HCL 2.5 % OP SOLN
1.0000 [drp] | OPHTHALMIC | Status: AC
  Administered 2015-01-01 (×3): 1 [drp] via OPHTHALMIC

## 2015-01-01 MED ORDER — FENTANYL CITRATE (PF) 100 MCG/2ML IJ SOLN
25.0000 ug | INTRAMUSCULAR | Status: AC
  Administered 2015-01-01 (×2): 25 ug via INTRAVENOUS

## 2015-01-01 MED ORDER — MIDAZOLAM HCL 2 MG/2ML IJ SOLN
INTRAMUSCULAR | Status: AC
  Filled 2015-01-01: qty 2

## 2015-01-01 MED ORDER — EPINEPHRINE HCL 1 MG/ML IJ SOLN
INTRAOCULAR | Status: DC | PRN
  Administered 2015-01-01: 500 mL

## 2015-01-01 MED ORDER — PROVISC 10 MG/ML IO SOLN
INTRAOCULAR | Status: DC | PRN
  Administered 2015-01-01: 0.85 mL via INTRAOCULAR

## 2015-01-01 MED ORDER — EPINEPHRINE HCL 1 MG/ML IJ SOLN
INTRAMUSCULAR | Status: AC
  Filled 2015-01-01: qty 1

## 2015-01-01 MED ORDER — BSS IO SOLN
INTRAOCULAR | Status: DC | PRN
  Administered 2015-01-01: 15 mL

## 2015-01-01 MED ORDER — FENTANYL CITRATE (PF) 100 MCG/2ML IJ SOLN
INTRAMUSCULAR | Status: AC
  Filled 2015-01-01: qty 2

## 2015-01-01 SURGICAL SUPPLY — 12 items
CLOTH BEACON ORANGE TIMEOUT ST (SAFETY) ×3 IMPLANT
EYE SHIELD UNIVERSAL CLEAR (GAUZE/BANDAGES/DRESSINGS) ×3 IMPLANT
GLOVE BIOGEL PI IND STRL 7.5 (GLOVE) ×1 IMPLANT
GLOVE BIOGEL PI INDICATOR 7.5 (GLOVE) ×2
GLOVE ECLIPSE 7.0 STRL STRAW (GLOVE) ×3 IMPLANT
GLOVE EXAM NITRILE MD LF STRL (GLOVE) ×3 IMPLANT
LENS ALC ACRYL/TECN (Ophthalmic Related) ×3 IMPLANT
PAD ARMBOARD 7.5X6 YLW CONV (MISCELLANEOUS) ×3 IMPLANT
RING MALYGIN (MISCELLANEOUS) ×3 IMPLANT
TAPE SURG TRANSPORE 1 IN (GAUZE/BANDAGES/DRESSINGS) ×1 IMPLANT
TAPE SURGICAL TRANSPORE 1 IN (GAUZE/BANDAGES/DRESSINGS) ×2
WATER STERILE IRR 250ML POUR (IV SOLUTION) ×3 IMPLANT

## 2015-01-01 NOTE — Anesthesia Procedure Notes (Signed)
Procedure Name: MAC Date/Time: 01/01/2015 9:08 AM Performed by: Andree Elk, AMY A Pre-anesthesia Checklist: Patient identified, Timeout performed, Emergency Drugs available, Suction available and Patient being monitored Oxygen Delivery Method: Nasal cannula

## 2015-01-01 NOTE — Anesthesia Postprocedure Evaluation (Signed)
  Anesthesia Post-op Note  Patient: Sandra [name]  Procedure(s) Performed: Procedure(s) with comments: CATARACT EXTRACTION PHACO AND INTRAOCULAR LENS PLACEMENT (IOC) (Right) - CDE:5.11  Patient Location: Short Stay  Anesthesia Type:MAC  Level of Consciousness: awake, alert , oriented and patient cooperative  Airway and Oxygen Therapy: Patient Spontanous Breathing  Post-op Pain: none  Post-op Assessment: Post-op Vital signs reviewed, Patient's Cardiovascular Status Stable, Respiratory Function Stable, Patent Airway, No signs of Nausea or vomiting and Pain level controlled              Post-op Vital Signs: Reviewed and stable  Last Vitals:  Filed Vitals:   01/01/15 0906  BP: 124/70  Pulse:   Temp:   Resp: 22    Complications: No apparent anesthesia complications

## 2015-01-01 NOTE — Discharge Instructions (Signed)
Sandra [name]  01/01/2015           [facility] Inst[facility] [place] [address]      1. Avoid closing eyes tightly. One often closes the eye tightly when laughing, talking, sneezing, coughing or if they feel irritated. At these times, you should be careful not to close your eyes tightly.  2. Instill eye drops as instructed. To instill drops in your eye, open it, look up and have someone gently pull the lower lid down and instill a couple of drops inside the lower lid.  3. Do not touch upper lid.  4. Take Advil or Tylenol for pain.  5. You may use either eye for near work, such as reading or sewing and you may watch television.  6. You may have your hair done at the beauty parlor at any time.  7. Wear dark glasses with or without your own glasses if you are in bright light.  8. Call our office at [phone] or [phone] if you have sharp pain in your eye or unusual symptoms.  9. Do not be concerned because vision in the operative eye is not good. It will not be good, no matter how successful the operation, until you get a special lens for it. Your old glasses will not be suited to the new eye that was operated on and you will not be ready for a new lens for about a month.  10. Follow up at the [facility] office.    I have received a copy of the above instructions and will follow them.    Follow up with Dr. Gershon Crane between 2-3pm today.

## 2015-01-01 NOTE — Anesthesia Preprocedure Evaluation (Addendum)
Anesthesia Evaluation  Patient identified by MRN, date of birth, ID band Patient awake    Reviewed: Allergy & Precautions, NPO status , Patient's Chart, lab work & pertinent test results, reviewed documented beta blocker date and time   Airway Mallampati: II  TM Distance: >3 FB     Dental  (+) Teeth Intact   Pulmonary sleep apnea ,  breath sounds clear to auscultation        Cardiovascular hypertension, Pt. on medications and Pt. on home beta blockers Rhythm:Regular Rate:Normal     Neuro/Psych  Headaches,    GI/Hepatic GERD-  Medicated and Controlled,  Endo/Other  diabetes, Type 2  Renal/GU      Musculoskeletal  (+) Arthritis -,   Abdominal   Peds  Hematology  (+) anemia ,   Anesthesia Other Findings   Reproductive/Obstetrics                            Anesthesia Physical Anesthesia Plan  ASA: III  Anesthesia Plan: MAC   Post-op Pain Management:    Induction: Intravenous  Airway Management Planned: Nasal Cannula  Additional Equipment:   Intra-op Plan:   Post-operative Plan:   Informed Consent: I have reviewed the patients History and Physical, chart, labs and discussed the procedure including the risks, benefits and alternatives for the proposed anesthesia with the patient or authorized representative who has indicated his/her understanding and acceptance.     Plan Discussed with:   Anesthesia Plan Comments:         Anesthesia Quick Evaluation

## 2015-01-01 NOTE — Op Note (Signed)
Patient brought to the operating room and prepped and draped in the usual manner.  Lid speculum inserted in right eye.  Stab incision made at the twelve o'clock position.  Provisc instilled in the anterior chamber.   A 2.4 mm. Stab incision was made temporally.  Due to a small pupil, a Malugyn Ring was inserted.  An anterior capsulotomy was done with a bent 25 gauge needle.  The nucleus was hydrodissected.  The Phaco tip was inserted in the anterior chamber and the nucleus was emulsified.  CDE was 5.11.  The cortical material was then removed with the I and A tip.  Posterior capsule was the polished.  The anterior chamber was deepened with Provisc.  A 20.0 Diopter Alcon [id number] IOL was then inserted in the capsular bag. The Malugyn Ring was removed.  Provisc was then removed with the I and A tip.  The wound was then hydrated.  Patient sent to the Recovery Room in good condition with follow up in my office.  Preoperative Diagnosis:  Nuclear Cataract OD Postoperative Diagnosis:  Same Procedure name: Kelman Phacoemulsification OD with IOL

## 2015-01-01 NOTE — H&P (Signed)
The patient was re examined and there is no change in the patients condition since the original H and P. 

## 2015-01-01 NOTE — Transfer of Care (Signed)
Immediate Anesthesia Transfer of Care Note  Patient: Sandra [name]  Procedure(s) Performed: Procedure(s) with comments: CATARACT EXTRACTION PHACO AND INTRAOCULAR LENS PLACEMENT (IOC) (Right) - CDE:5.11  Patient Location: Short Stay  Anesthesia Type:MAC  Level of Consciousness: awake, alert , oriented and patient cooperative  Airway & Oxygen Therapy: Patient Spontanous Breathing  Post-op Assessment: Report given to RN and Post -op Vital signs reviewed and stable  Post vital signs: Reviewed and stable  Last Vitals:  Filed Vitals:   01/01/15 0906  BP: 124/70  Pulse:   Temp:   Resp: 22    Complications: No apparent anesthesia complications

## 2015-01-02 ENCOUNTER — Encounter (HOSPITAL_COMMUNITY): Payer: Self-pay | Admitting: Ophthalmology

## 2015-01-07 ENCOUNTER — Encounter (HOSPITAL_COMMUNITY): Payer: Self-pay | Admitting: Emergency Medicine

## 2015-01-07 ENCOUNTER — Emergency Department (HOSPITAL_COMMUNITY)
Admission: EM | Admit: 2015-01-07 | Discharge: 2015-01-07 | Disposition: A | Discharge status: Home / Self Care | Payer: Medicare Other | Attending: Emergency Medicine | Admitting: Emergency Medicine

## 2015-01-07 DIAGNOSIS — H5711 Ocular pain, right eye: Secondary | ICD-10-CM | POA: Diagnosis not present

## 2015-01-07 DIAGNOSIS — E780 Pure hypercholesterolemia: Secondary | ICD-10-CM | POA: Insufficient documentation

## 2015-01-07 DIAGNOSIS — I10 Essential (primary) hypertension: Secondary | ICD-10-CM | POA: Insufficient documentation

## 2015-01-07 DIAGNOSIS — K219 Gastro-esophageal reflux disease without esophagitis: Secondary | ICD-10-CM | POA: Diagnosis not present

## 2015-01-07 DIAGNOSIS — M1990 Unspecified osteoarthritis, unspecified site: Secondary | ICD-10-CM | POA: Diagnosis not present

## 2015-01-07 DIAGNOSIS — Z880 Allergy status to penicillin: Secondary | ICD-10-CM | POA: Diagnosis not present

## 2015-01-07 DIAGNOSIS — Z91040 Latex allergy status: Secondary | ICD-10-CM | POA: Insufficient documentation

## 2015-01-07 DIAGNOSIS — Z7952 Long term (current) use of systemic steroids: Secondary | ICD-10-CM | POA: Insufficient documentation

## 2015-01-07 DIAGNOSIS — E119 Type 2 diabetes mellitus without complications: Secondary | ICD-10-CM | POA: Insufficient documentation

## 2015-01-07 DIAGNOSIS — Z79899 Other long term (current) drug therapy: Secondary | ICD-10-CM | POA: Insufficient documentation

## 2015-01-07 DIAGNOSIS — Z862 Personal history of diseases of the blood and blood-forming organs and certain disorders involving the immune mechanism: Secondary | ICD-10-CM | POA: Insufficient documentation

## 2015-01-07 DIAGNOSIS — Z7982 Long term (current) use of aspirin: Secondary | ICD-10-CM | POA: Diagnosis not present

## 2015-01-07 MED ORDER — TETRACAINE HCL 0.5 % OP SOLN
2.0000 [drp] | Freq: Once | OPHTHALMIC | Status: AC
  Administered 2015-01-07: 2 [drp] via OPHTHALMIC
  Filled 2015-01-07: qty 2

## 2015-01-07 MED ORDER — HYDROCODONE-ACETAMINOPHEN 5-325 MG PO TABS: 2.0000 | ORAL_TABLET | ORAL | Status: DC | PRN

## 2015-01-07 NOTE — ED Provider Notes (Addendum)
CSN: [id number]     Arrival date & time 01/07/15  1145 History  This chart was scribed for Tanna Furry, MD by Evelene Croon, ED Scribe. This patient was seen in room [facility]08/[facility]08 and the patient's care was started 12:52 PM.    Chief Complaint  Patient presents with  . Eye Pain    The history is provided by the patient. No language interpreter was used.    HPI Comments:  Sandra [name] is a 64 y.o. female who presents to the Emergency Department complaining of aching pain to her right eye for 7 days. She reports associated photophobia. Pt had cataract surgery on her right eye ~ 1 week ago and states the pain never completely ceased. She notes vision has improved. She denies drainage from the eye. Dr Gershon Crane performed her eye surgeon ([address]). Pt called his office a few times but has been unable to reach him. No alleviating factors noted.  Past Medical History  Diagnosis Date  . Diabetes mellitus 2012    borderline diabetes, never on medicine   . Acid reflux 2000    HA with nexium   . Enteritis 04/23/2011  . Hypercholesteremia 2011  . Hypertension 1990s   . Ileitis 03/24/2014  . Chest pain, unspecified 03/25/2014    MI ruled out. CTA negative PE  . Sleep apnea   . Arthritis   . Anemia    Past Surgical History  Procedure Laterality Date  . Abdominal hysterectomy  1983   . Incontinence surgery  1990   . Tubal ligation  1983   . Cataract extraction w/phaco Right 01/01/2015    Procedure: CATARACT EXTRACTION PHACO AND INTRAOCULAR LENS PLACEMENT (IOC);  Surgeon: Rutherford Guys, MD;  Location: [facility];  Service: Ophthalmology;  Laterality: Right;  CDE:5.11   Family History  Problem Relation Age of Onset  . Diabetes Mother   . Heart disease Mother   . Stroke Mother   . Diabetes Maternal Aunt   . Cancer Maternal Aunt 60    stomach   . Diabetes Maternal Uncle   . Cancer Daughter 29    breast    History  Substance Use Topics  . Smoking status: Never Smoker   . Smokeless  tobacco: Never Used  . Alcohol Use: No   OB History    Gravida Para Term Preterm AB TAB SAB Ectopic Multiple Living   3 3 3       2      Review of Systems  Constitutional: Negative for fever and chills.  Eyes: Positive for photophobia and pain.  Neurological: Negative for headaches.  All other systems reviewed and are negative.     Allergies  Latex; Lisinopril; Penicillins; and Sulfa antibiotics  Home Medications   Prior to Admission medications   Medication Sig Start Date End Date Taking? Authorizing Provider  acetaminophen (TYLENOL) 500 MG tablet Take 1,000 mg by mouth every 6 (six) hours as needed for headache.   Yes Historical Provider, MD  aspirin EC 81 MG tablet Take 81 mg by mouth daily.   Yes Historical Provider, MD  atorvastatin (LIPITOR) 10 MG tablet Take 1 tablet (10 mg total) by mouth daily. 12/04/14  Yes Josalyn Funches, MD  hydrochlorothiazide (HYDRODIURIL) 25 MG tablet Take 1 tablet (25 mg total) by mouth daily. 11/22/14  Yes Josalyn Funches, MD  metoprolol tartrate (LOPRESSOR) 25 MG tablet Take 1 tablet (25 mg total) by mouth 2 (two) times daily. 11/22/14  Yes Josalyn Funches, MD  ofloxacin (OCUFLOX) 0.3 %  ophthalmic solution Place 1 drop into the right eye 2 (two) times daily.   Yes Historical Provider, MD  pantoprazole (PROTONIX) 40 MG tablet Take 1 tablet (40 mg total) by mouth daily. Treats acid reflux. 12/04/14  Yes Josalyn Funches, MD  prednisoLONE acetate (PRED FORTE) 1 % ophthalmic suspension Place 1 drop into the right eye 3 (three) times daily.   Yes Historical Provider, MD  docusate sodium (COLACE) 100 MG capsule Take 1 capsule (100 mg total) by mouth 2 (two) times daily as needed for mild constipation. 04/02/14   Boykin Nearing, MD  HYDROcodone-acetaminophen (NORCO/VICODIN) 5-325 MG per tablet Take 2 tablets by mouth every 4 (four) hours as needed. 01/07/15   Tanna Furry, MD  ondansetron (ZOFRAN) 4 MG tablet Take 1 tablet (4 mg total) by mouth every 8 (eight)  hours as needed for nausea or vomiting. Patient not taking: Reported on 11/20/2014 04/10/14   Boykin Nearing, MD  OVER THE COUNTER MEDICATION Take 1 tablet by mouth daily as needed (allergies). OTC Allergy medication    Historical Provider, MD  ranitidine (ZANTAC) 150 MG tablet For treatment of gastric acid reflux. Take this medication twice daily only if you cannot afford Protonix. If you can get Protonix filled, then stop this medication. Patient not taking: Reported on 11/20/2014 03/27/14   Rexene Alberts, MD   BP 141/56 mmHg  Pulse 59  Temp(Src) 98.4 F (36.9 C) (Oral)  Resp 16  Ht 5\' 3"  (1.6 m)  Wt 223 lb (101.152 kg)  BMI 39.51 kg/m2  SpO2 94% Physical Exam  Constitutional: She is oriented to person, place, and time. She appears well-developed and well-nourished. No distress.  HENT:  Head: Normocephalic.  Eyes: Conjunctivae are normal. Pupils are equal, round, and reactive to light. No scleral icterus.  Tenderness to upper right lid below the brow Anterior chamber clear  Neck: Normal range of motion. Neck supple. No thyromegaly present.  Cardiovascular: Normal rate and regular rhythm.  Exam reveals no gallop and no friction rub.   No murmur heard. Pulmonary/Chest: Effort normal and breath sounds normal. No respiratory distress. She has no wheezes. She has no rales.  Abdominal: Soft. Bowel sounds are normal. She exhibits no distension. There is no tenderness. There is no rebound.  Musculoskeletal: Normal range of motion.  Neurological: She is alert and oriented to person, place, and time.  Skin: Skin is warm and dry. No rash noted.  Psychiatric: She has a normal mood and affect. Her behavior is normal.    ED Course  Procedures   DIAGNOSTIC STUDIES:  Oxygen Saturation is 98% on RA, normal by my interpretation.    COORDINATION OF CARE:  12:57 PM Discussed treatment plan with pt at bedside and pt agreed to plan.  Labs Review Labs Reviewed - No data to display  Imaging  Review No results found.   EKG Interpretation None      MDM   Final diagnoses:  Eye pain, right     Normal VA.  Normal Slit lamp and Fundoscopic.  Pressure of 9 with tono pen. D/W Dr. Gershon Crane.  Out patiebnt f/u.   Tanna Furry, MD 01/07/15 1445  Tanna Furry, MD 01/07/15 [phone]

## 2015-01-07 NOTE — Discharge Instructions (Signed)
Call Dr. Gershon Crane tomorrow.  If still painful, he will arrange to see you in [place].    Otherwise, Appointment on Thursday in [place].

## 2015-01-07 NOTE — ED Notes (Signed)
Pt reports she had a cataract removed on Tues, has had pain since. Pt reports photosensitivity and redness.

## 2015-01-08 ENCOUNTER — Encounter (HOSPITAL_COMMUNITY): Admission: RE | Admit: 2015-01-08 | Payer: Medicare Other | Source: Ambulatory Visit

## 2015-01-09 ENCOUNTER — Encounter (HOSPITAL_COMMUNITY)
Admission: RE | Admit: 2015-01-09 | Discharge: 2015-01-09 | Disposition: A | Discharge status: Home / Self Care | Payer: Medicare Other | Source: Ambulatory Visit | Attending: Ophthalmology | Admitting: Ophthalmology

## 2015-01-14 MED ORDER — KETOROLAC TROMETHAMINE 0.5 % OP SOLN
OPHTHALMIC | Status: AC
  Filled 2015-01-14: qty 5

## 2015-01-14 MED ORDER — CYCLOPENTOLATE-PHENYLEPHRINE OP SOLN OPTIME - NO CHARGE
OPHTHALMIC | Status: AC
  Filled 2015-01-14: qty 2

## 2015-01-14 MED ORDER — TETRACAINE HCL 0.5 % OP SOLN
OPHTHALMIC | Status: AC
  Filled 2015-01-14: qty 2

## 2015-01-14 MED ORDER — PHENYLEPHRINE HCL 2.5 % OP SOLN
OPHTHALMIC | Status: AC
  Filled 2015-01-14: qty 15

## 2015-01-15 ENCOUNTER — Encounter (HOSPITAL_COMMUNITY): Payer: Self-pay | Admitting: *Deleted

## 2015-01-15 ENCOUNTER — Ambulatory Visit (HOSPITAL_COMMUNITY): Payer: Medicare Other | Admitting: Anesthesiology

## 2015-01-15 ENCOUNTER — Encounter (HOSPITAL_COMMUNITY)
Admission: RE | Disposition: A | Discharge status: Home / Self Care | Payer: Self-pay | Source: Ambulatory Visit | Attending: Ophthalmology

## 2015-01-15 ENCOUNTER — Ambulatory Visit (HOSPITAL_COMMUNITY)
Admission: RE | Admit: 2015-01-15 | Discharge: 2015-01-15 | Disposition: A | Discharge status: Home / Self Care | Payer: Medicare Other | Source: Ambulatory Visit | Attending: Ophthalmology | Admitting: Ophthalmology

## 2015-01-15 DIAGNOSIS — D649 Anemia, unspecified: Secondary | ICD-10-CM | POA: Diagnosis not present

## 2015-01-15 DIAGNOSIS — I10 Essential (primary) hypertension: Secondary | ICD-10-CM | POA: Insufficient documentation

## 2015-01-15 DIAGNOSIS — M1990 Unspecified osteoarthritis, unspecified site: Secondary | ICD-10-CM | POA: Insufficient documentation

## 2015-01-15 DIAGNOSIS — Z880 Allergy status to penicillin: Secondary | ICD-10-CM | POA: Diagnosis not present

## 2015-01-15 DIAGNOSIS — H2512 Age-related nuclear cataract, left eye: Secondary | ICD-10-CM | POA: Insufficient documentation

## 2015-01-15 DIAGNOSIS — G4730 Sleep apnea, unspecified: Secondary | ICD-10-CM | POA: Diagnosis not present

## 2015-01-15 DIAGNOSIS — Z882 Allergy status to sulfonamides status: Secondary | ICD-10-CM | POA: Diagnosis not present

## 2015-01-15 DIAGNOSIS — K219 Gastro-esophageal reflux disease without esophagitis: Secondary | ICD-10-CM | POA: Insufficient documentation

## 2015-01-15 DIAGNOSIS — Z91040 Latex allergy status: Secondary | ICD-10-CM | POA: Diagnosis not present

## 2015-01-15 DIAGNOSIS — E119 Type 2 diabetes mellitus without complications: Secondary | ICD-10-CM | POA: Insufficient documentation

## 2015-01-15 DIAGNOSIS — Z7982 Long term (current) use of aspirin: Secondary | ICD-10-CM | POA: Diagnosis not present

## 2015-01-15 HISTORY — PX: CATARACT EXTRACTION W/PHACO: SHX586

## 2015-01-15 LAB — GLUCOSE, CAPILLARY: GLUCOSE-CAPILLARY: 115 mg/dL — AB (ref 65–99)

## 2015-01-15 SURGERY — PHACOEMULSIFICATION, CATARACT, WITH IOL INSERTION
Anesthesia: Monitor Anesthesia Care | Site: Eye | Laterality: Left

## 2015-01-15 MED ORDER — MIDAZOLAM HCL 2 MG/2ML IJ SOLN
1.0000 mg | INTRAMUSCULAR | Status: DC | PRN
Start: 2015-01-15 — End: 2015-01-15
  Administered 2015-01-15: 2 mg via INTRAVENOUS

## 2015-01-15 MED ORDER — LIDOCAINE HCL (PF) 1 % IJ SOLN
INTRAMUSCULAR | Status: DC | PRN
  Administered 2015-01-15: .5 mL

## 2015-01-15 MED ORDER — TETRACAINE HCL 0.5 % OP SOLN
1.0000 [drp] | OPHTHALMIC | Status: AC
  Administered 2015-01-15 (×3): 1 [drp] via OPHTHALMIC

## 2015-01-15 MED ORDER — CYCLOPENTOLATE-PHENYLEPHRINE 0.2-1 % OP SOLN
1.0000 [drp] | OPHTHALMIC | Status: AC
  Administered 2015-01-15 (×3): 1 [drp] via OPHTHALMIC

## 2015-01-15 MED ORDER — MIDAZOLAM HCL 2 MG/2ML IJ SOLN
INTRAMUSCULAR | Status: AC
  Filled 2015-01-15: qty 2

## 2015-01-15 MED ORDER — PROVISC 10 MG/ML IO SOLN
INTRAOCULAR | Status: DC | PRN
  Administered 2015-01-15: 0.85 mL via INTRAOCULAR

## 2015-01-15 MED ORDER — FENTANYL CITRATE (PF) 100 MCG/2ML IJ SOLN
25.0000 ug | INTRAMUSCULAR | Status: AC
  Administered 2015-01-15 (×2): 25 ug via INTRAVENOUS

## 2015-01-15 MED ORDER — LIDOCAINE HCL (PF) 1 % IJ SOLN
INTRAMUSCULAR | Status: AC
  Filled 2015-01-15: qty 2

## 2015-01-15 MED ORDER — FENTANYL CITRATE (PF) 100 MCG/2ML IJ SOLN
INTRAMUSCULAR | Status: AC
  Filled 2015-01-15: qty 2

## 2015-01-15 MED ORDER — EPINEPHRINE HCL 1 MG/ML IJ SOLN
INTRAMUSCULAR | Status: AC
  Filled 2015-01-15: qty 1

## 2015-01-15 MED ORDER — LIDOCAINE HCL 3.5 % OP GEL: 1.0000 "application " | Freq: Once | OPHTHALMIC | Status: DC

## 2015-01-15 MED ORDER — KETOROLAC TROMETHAMINE 0.5 % OP SOLN
1.0000 [drp] | OPHTHALMIC | Status: AC
  Administered 2015-01-15 (×3): 1 [drp] via OPHTHALMIC

## 2015-01-15 MED ORDER — PHENYLEPHRINE HCL 2.5 % OP SOLN
1.0000 [drp] | OPHTHALMIC | Status: AC
  Administered 2015-01-15 (×3): 1 [drp] via OPHTHALMIC

## 2015-01-15 MED ORDER — BSS IO SOLN
INTRAOCULAR | Status: DC | PRN
  Administered 2015-01-15: 500 mL

## 2015-01-15 MED ORDER — BSS IO SOLN
INTRAOCULAR | Status: DC | PRN
  Administered 2015-01-15: 15 mL

## 2015-01-15 MED ORDER — LACTATED RINGERS IV SOLN
INTRAVENOUS | Status: DC
  Administered 2015-01-15: 08:00:00 via INTRAVENOUS

## 2015-01-15 SURGICAL SUPPLY — 11 items
CLOTH BEACON ORANGE TIMEOUT ST (SAFETY) ×3 IMPLANT
EYE SHIELD UNIVERSAL CLEAR (GAUZE/BANDAGES/DRESSINGS) ×3 IMPLANT
GLOVE ECLIPSE 6.5 STRL STRAW (GLOVE) ×3 IMPLANT
GLOVE EXAM NITRILE MD LF STRL (GLOVE) ×3 IMPLANT
GLOVE SURG SS PI 7.5 STRL IVOR (GLOVE) ×3 IMPLANT
LENS ALC ACRYL/TECN (Ophthalmic Related) ×3 IMPLANT
PAD ARMBOARD 7.5X6 YLW CONV (MISCELLANEOUS) ×3 IMPLANT
RING MALYGIN (MISCELLANEOUS) ×3 IMPLANT
TAPE SURG TRANSPORE 1 IN (GAUZE/BANDAGES/DRESSINGS) ×1 IMPLANT
TAPE SURGICAL TRANSPORE 1 IN (GAUZE/BANDAGES/DRESSINGS) ×2
WATER STERILE IRR 250ML POUR (IV SOLUTION) ×3 IMPLANT

## 2015-01-15 NOTE — Transfer of Care (Signed)
Immediate Anesthesia Transfer of Care Note  Patient: Sandra [name]  Procedure(s) Performed: Procedure(s) (LRB): CATARACT EXTRACTION PHACO AND INTRAOCULAR LENS PLACEMENT (IOC) (Left)  Patient Location: Shortstay  Anesthesia Type: MAC  Level of Consciousness: awake  Airway & Oxygen Therapy: Patient Spontanous Breathing   Post-op Assessment: Report given to PACU RN, Post -op Vital signs reviewed and stable and Patient moving all extremities  Post vital signs: Reviewed and stable  Complications: No apparent anesthesia complications

## 2015-01-15 NOTE — Anesthesia Procedure Notes (Signed)
Procedure Name: MAC Date/Time: 01/15/2015 8:39 AM Performed by: Vista Deck Pre-anesthesia Checklist: Patient identified, Emergency Drugs available, Suction available, Timeout performed and Patient being monitored Patient Re-evaluated:Patient Re-evaluated prior to inductionOxygen Delivery Method: Nasal Cannula

## 2015-01-15 NOTE — Discharge Instructions (Signed)
Railyn House Leffel  01/15/2015           [facility] Instructions [place] [address]      1. Avoid closing eyes tightly. One often closes the eye tightly when laughing, talking, sneezing, coughing or if they feel irritated. At these times, you should be careful not to close your eyes tightly.  2. Instill eye drops as instructed. To instill drops in your eye, open it, look up and have someone gently pull the lower lid down and instill a couple of drops inside the lower lid.  3. Do not touch upper lid.  4. Take Advil or Tylenol for pain.  5. You may use either eye for near work, such as reading or sewing and you may watch television.  6. You may have your hair done at the beauty parlor at any time.  7. Wear dark glasses with or without your own glasses if you are in bright light.  8. Call our office at [phone] or [phone] if you have sharp pain in your eye or unusual symptoms.  9. Do not be concerned because vision in the operative eye is not good. It will not be good, no matter how successful the operation, until you get a special lens for it. Your old glasses will not be suited to the new eye that was operated on and you will not be ready for a new lens for about a month.  10. Follow up at the [facility] office.    I have received a copy of the above instructions and will follow them.   Cataract Surgery Care After Refer to this sheet in the next few weeks. These instructions provide you with information on caring for yourself after your procedure. Your caregiver may also give you more specific instructions. Your treatment has been planned according to current medical practices, but problems sometimes occur. Call your caregiver if you have any problems or questions after your procedure.  HOME CARE INSTRUCTIONS   Avoid strenuous activities as directed by your caregiver.  Ask your caregiver when you can resume  driving.  Use eyedrops or other medicines to help healing and control pressure inside your eye as directed by your caregiver.  Only take over-the-counter or prescription medicines for pain, discomfort, or fever as directed by your caregiver.  Do not to touch or rub your eyes.  You may be instructed to use a protective shield during the first few days and nights after surgery. If not, wear sunglasses to protect your eyes. This is to protect the eye from pressure or from being accidentally bumped.  Keep the area around your eye clean and dry. Avoid swimming or allowing water to hit you directly in the face while showering. Keep soap and shampoo out of your eyes.  Do not bend or lift heavy objects. Bending increases pressure in the eye. You can walk, climb stairs, and do light household chores.  Do not put a contact lens into the eye that had surgery until your caregiver says it is okay to do so.  Ask your doctor when you can return to work. This will depend on the kind of work that you do. If you work in a dusty environment, you may be advised to wear protective eyewear for a period of time.  Ask your caregiver when it will be safe to engage in sexual activity.  Continue with your regular eye exams as directed by your caregiver. What to expect:  It is normal to feel itching and mild discomfort for a few days after cataract surgery. Some fluid discharge is also common, and your eye may be sensitive to light and touch.  After 1 to 2 days, even moderate discomfort should disappear. In most cases, healing will take about 6 weeks.  If you received an intraocular lens (IOL), you may notice that colors are very bright or have a blue tinge. Also, if you have been in bright sunlight, everything may appear reddish for a few hours. If you see these color tinges, it is because your lens is clear and no longer cloudy. Within a few months after receiving an IOL, these extra colors should go away. When you  have healed, you will probably need new glasses. SEEK MEDICAL CARE IF:   You have increased bruising around your eye.  You have discomfort not helped by medicine. SEEK IMMEDIATE MEDICAL CARE IF:   You have a fever.  You have a worsening or sudden vision loss.  You have redness, swelling, or increasing pain in the eye.  You have a thick discharge from the eye that had surgery. MAKE SURE YOU:  Understand these instructions.  Will watch your condition.  Will get help right away if you are not doing well or get worse. Document Released: 01/09/2005 Document Revised: 09/14/2011 Document Reviewed: 02/13/2011 [facility] Patient Information 2015 [place], [place]. This information is not intended to replace advice given to you by your health care provider. Make sure you discuss any questions you have with your health care provider.

## 2015-01-15 NOTE — Anesthesia Preprocedure Evaluation (Signed)
Anesthesia Evaluation  Patient identified by MRN, date of birth, ID band Patient awake    Reviewed: Allergy & Precautions, NPO status , Patient's Chart, lab work & pertinent test results, reviewed documented beta blocker date and time   Airway Mallampati: II  TM Distance: >3 FB     Dental  (+) Teeth Intact   Pulmonary sleep apnea ,  breath sounds clear to auscultation        Cardiovascular hypertension, Pt. on medications and Pt. on home beta blockers Rhythm:Regular Rate:Normal     Neuro/Psych  Headaches,    GI/Hepatic GERD-  Medicated and Controlled,  Endo/Other  diabetes, Type 2  Renal/GU      Musculoskeletal  (+) Arthritis -,   Abdominal   Peds  Hematology  (+) anemia ,   Anesthesia Other Findings   Reproductive/Obstetrics                             Anesthesia Physical Anesthesia Plan  ASA: III  Anesthesia Plan: MAC   Post-op Pain Management:    Induction: Intravenous  Airway Management Planned: Nasal Cannula  Additional Equipment:   Intra-op Plan:   Post-operative Plan:   Informed Consent: I have reviewed the patients History and Physical, chart, labs and discussed the procedure including the risks, benefits and alternatives for the proposed anesthesia with the patient or authorized representative who has indicated his/her understanding and acceptance.     Plan Discussed with:   Anesthesia Plan Comments:         Anesthesia Quick Evaluation

## 2015-01-15 NOTE — Anesthesia Postprocedure Evaluation (Signed)
  Anesthesia Post-op Note  Patient: Sandra [name]  Procedure(s) Performed: Procedure(s) (LRB): CATARACT EXTRACTION PHACO AND INTRAOCULAR LENS PLACEMENT (IOC) (Left)  Patient Location:  Short Stay  Anesthesia Type: MAC  Level of Consciousness: awake  Airway and Oxygen Therapy: Patient Spontanous Breathing  Post-op Pain: none  Post-op Assessment: Post-op Vital signs reviewed, Patient's Cardiovascular Status Stable, Respiratory Function Stable, Patent Airway, No signs of Nausea or vomiting and Pain level controlled  Post-op Vital Signs: Reviewed and stable  Complications: No apparent anesthesia complications

## 2015-01-15 NOTE — H&P (Signed)
The patient was re examined and there is no change in the patients condition since the original H and P. 

## 2015-01-15 NOTE — Op Note (Signed)
Patient brought to the operating room and prepped and draped in the usual manner.  Lid speculum inserted in left eye.  Stab incision made at the twelve o'clock position.  Provisc instilled in the anterior chamber.   A 2.4 mm. Stab incision was made temporally. Intraocular Xylocaine instilled.  Due to a small pupil, a Malugyn Ring was inserted.  An anterior capsulotomy was done with a bent 25 gauge needle.  The nucleus was hydrodissected.  The Phaco tip was inserted in the anterior chamber and the nucleus was emulsified.  CDE was 4.11.  The cortical material was then removed with the I and A tip.  Posterior capsule was the polished.  The anterior chamber was deepened with Provisc.  A 20.5 Diopter Alcon [id number]  IOL was then inserted in the capsular bag.  The Malygyn Ring was removed.  Provisc was then removed with the I and A tip.  The wound was then hydrated.  Patient sent to the Recovery Room in good condition with follow up in my office.  Preoperative Diagnosis:  Nuclear Cataract OS Postoperative Diagnosis:  Same Procedure name: Kelman Phacoemulsification OS with IOL

## 2015-01-17 ENCOUNTER — Encounter (HOSPITAL_COMMUNITY): Payer: Self-pay | Admitting: Ophthalmology

## 2015-02-22 ENCOUNTER — Other Ambulatory Visit (HOSPITAL_COMMUNITY): Payer: Self-pay | Admitting: Family Medicine

## 2015-02-22 DIAGNOSIS — Z1231 Encounter for screening mammogram for malignant neoplasm of breast: Secondary | ICD-10-CM

## 2015-02-27 ENCOUNTER — Ambulatory Visit (HOSPITAL_COMMUNITY): Payer: Medicare Other

## 2015-03-04 ENCOUNTER — Ambulatory Visit (HOSPITAL_COMMUNITY)
Admission: RE | Admit: 2015-03-04 | Discharge: 2015-03-04 | Disposition: A | Discharge status: Home / Self Care | Payer: Medicare Other | Source: Ambulatory Visit | Attending: Family Medicine | Admitting: Family Medicine

## 2015-03-04 DIAGNOSIS — Z1231 Encounter for screening mammogram for malignant neoplasm of breast: Secondary | ICD-10-CM | POA: Diagnosis not present

## 2015-05-03 ENCOUNTER — Emergency Department (HOSPITAL_COMMUNITY)
Admission: EM | Admit: 2015-05-03 | Discharge: 2015-05-03 | Disposition: A | Discharge status: Home / Self Care | Payer: Medicare Other | Attending: Emergency Medicine | Admitting: Emergency Medicine

## 2015-05-03 ENCOUNTER — Emergency Department (HOSPITAL_COMMUNITY): Payer: Medicare Other

## 2015-05-03 ENCOUNTER — Encounter (HOSPITAL_COMMUNITY): Payer: Self-pay | Admitting: *Deleted

## 2015-05-03 DIAGNOSIS — Z862 Personal history of diseases of the blood and blood-forming organs and certain disorders involving the immune mechanism: Secondary | ICD-10-CM | POA: Diagnosis not present

## 2015-05-03 DIAGNOSIS — Z8669 Personal history of other diseases of the nervous system and sense organs: Secondary | ICD-10-CM | POA: Insufficient documentation

## 2015-05-03 DIAGNOSIS — Z91040 Latex allergy status: Secondary | ICD-10-CM | POA: Insufficient documentation

## 2015-05-03 DIAGNOSIS — Z880 Allergy status to penicillin: Secondary | ICD-10-CM | POA: Diagnosis not present

## 2015-05-03 DIAGNOSIS — Z79899 Other long term (current) drug therapy: Secondary | ICD-10-CM | POA: Insufficient documentation

## 2015-05-03 DIAGNOSIS — E7800 Pure hypercholesterolemia, unspecified: Secondary | ICD-10-CM | POA: Diagnosis not present

## 2015-05-03 DIAGNOSIS — Z90710 Acquired absence of both cervix and uterus: Secondary | ICD-10-CM | POA: Diagnosis not present

## 2015-05-03 DIAGNOSIS — R1084 Generalized abdominal pain: Secondary | ICD-10-CM | POA: Insufficient documentation

## 2015-05-03 DIAGNOSIS — M1990 Unspecified osteoarthritis, unspecified site: Secondary | ICD-10-CM | POA: Diagnosis not present

## 2015-05-03 DIAGNOSIS — R1012 Left upper quadrant pain: Secondary | ICD-10-CM | POA: Diagnosis present

## 2015-05-03 DIAGNOSIS — R110 Nausea: Secondary | ICD-10-CM | POA: Insufficient documentation

## 2015-05-03 DIAGNOSIS — Z7982 Long term (current) use of aspirin: Secondary | ICD-10-CM | POA: Diagnosis not present

## 2015-05-03 DIAGNOSIS — E119 Type 2 diabetes mellitus without complications: Secondary | ICD-10-CM | POA: Diagnosis not present

## 2015-05-03 DIAGNOSIS — K219 Gastro-esophageal reflux disease without esophagitis: Secondary | ICD-10-CM | POA: Insufficient documentation

## 2015-05-03 DIAGNOSIS — R062 Wheezing: Secondary | ICD-10-CM | POA: Insufficient documentation

## 2015-05-03 DIAGNOSIS — Z9851 Tubal ligation status: Secondary | ICD-10-CM | POA: Diagnosis not present

## 2015-05-03 DIAGNOSIS — I10 Essential (primary) hypertension: Secondary | ICD-10-CM | POA: Insufficient documentation

## 2015-05-03 LAB — CBC WITH DIFFERENTIAL/PLATELET
BASOS PCT: 1 %
Basophils Absolute: 0.1 10*3/uL (ref 0.0–0.1)
EOS ABS: 0.2 10*3/uL (ref 0.0–0.7)
EOS PCT: 3 %
HCT: 35.4 % — ABNORMAL LOW (ref 36.0–46.0)
Hemoglobin: 11.5 g/dL — ABNORMAL LOW (ref 12.0–15.0)
Lymphocytes Relative: 35 %
Lymphs Abs: 2.6 10*3/uL (ref 0.7–4.0)
MCH: 26.7 pg (ref 26.0–34.0)
MCHC: 32.5 g/dL (ref 30.0–36.0)
MCV: 82.1 fL (ref 78.0–100.0)
MONO ABS: 0.5 10*3/uL (ref 0.1–1.0)
MONOS PCT: 6 %
Neutro Abs: 4.2 10*3/uL (ref 1.7–7.7)
Neutrophils Relative %: 55 %
PLATELETS: 332 10*3/uL (ref 150–400)
RBC: 4.31 MIL/uL (ref 3.87–5.11)
RDW: 13.9 % (ref 11.5–15.5)
WBC: 7.5 10*3/uL (ref 4.0–10.5)

## 2015-05-03 LAB — COMPREHENSIVE METABOLIC PANEL
ALT: 14 U/L (ref 14–54)
ANION GAP: 9 (ref 5–15)
AST: 20 U/L (ref 15–41)
Albumin: 3.8 g/dL (ref 3.5–5.0)
Alkaline Phosphatase: 125 U/L (ref 38–126)
BUN: 19 mg/dL (ref 6–20)
CALCIUM: 9.4 mg/dL (ref 8.9–10.3)
CO2: 31 mmol/L (ref 22–32)
Chloride: 98 mmol/L — ABNORMAL LOW (ref 101–111)
Creatinine, Ser: 0.85 mg/dL (ref 0.44–1.00)
GFR calc non Af Amer: >60 mL/min (ref >60)
Glucose, Bld: 138 mg/dL — ABNORMAL HIGH (ref 65–99)
POTASSIUM: 3.3 mmol/L — AB (ref 3.5–5.1)
SODIUM: 138 mmol/L (ref 135–145)
Total Bilirubin: 0.5 mg/dL (ref 0.3–1.2)
Total Protein: 7.9 g/dL (ref 6.5–8.1)

## 2015-05-03 LAB — URINALYSIS, ROUTINE W REFLEX MICROSCOPIC
Bilirubin Urine: NEGATIVE
GLUCOSE, UA: NEGATIVE mg/dL
KETONES UR: NEGATIVE mg/dL
Leukocytes, UA: NEGATIVE
Nitrite: NEGATIVE
PH: 7.5 (ref 5.0–8.0)
Protein, ur: NEGATIVE mg/dL
Specific Gravity, Urine: 1.01 (ref 1.005–1.030)
Urobilinogen, UA: 0.2 mg/dL (ref 0.0–1.0)

## 2015-05-03 LAB — URINE MICROSCOPIC-ADD ON

## 2015-05-03 LAB — LIPASE, BLOOD: LIPASE: 24 U/L (ref 11–51)

## 2015-05-03 MED ORDER — ALBUTEROL SULFATE HFA 108 (90 BASE) MCG/ACT IN AERS
1.0000 | INHALATION_SPRAY | Freq: Four times a day (QID) | RESPIRATORY_TRACT | Status: AC | PRN
Start: 2015-05-03 — End: ?

## 2015-05-03 MED ORDER — RANITIDINE HCL 150 MG PO TABS: 150.0000 mg | ORAL_TABLET | Freq: Two times a day (BID) | ORAL | Status: DC

## 2015-05-03 MED ORDER — IOHEXOL 300 MG/ML  SOLN
100.0000 mL | Freq: Once | INTRAMUSCULAR | Status: AC | PRN
  Administered 2015-05-03: 100 mL via INTRAVENOUS

## 2015-05-03 MED ORDER — ONDANSETRON HCL 4 MG/2ML IJ SOLN
4.0000 mg | Freq: Once | INTRAMUSCULAR | Status: AC
  Administered 2015-05-03: 4 mg via INTRAVENOUS
  Filled 2015-05-03: qty 2

## 2015-05-03 MED ORDER — IPRATROPIUM-ALBUTEROL 0.5-2.5 (3) MG/3ML IN SOLN
3.0000 mL | Freq: Once | RESPIRATORY_TRACT | Status: AC
  Administered 2015-05-03: 3 mL via RESPIRATORY_TRACT
  Filled 2015-05-03: qty 3

## 2015-05-03 MED ORDER — SODIUM CHLORIDE 0.9 % IJ SOLN
INTRAMUSCULAR | Status: AC
  Filled 2015-05-03: qty 45

## 2015-05-03 MED ORDER — IOHEXOL 300 MG/ML  SOLN
25.0000 mL | Freq: Once | INTRAMUSCULAR | Status: AC | PRN
Start: 2015-05-03 — End: 2015-05-03
  Administered 2015-05-03: 25 mL via ORAL

## 2015-05-03 NOTE — ED Notes (Addendum)
Pt c/o llq pain about 15 mins after she eats.. Mild x 3 weeks today is worse. Pt thinks it is due to taking omeprazole she is taking. Denies any other symptoms.

## 2015-05-03 NOTE — Discharge Instructions (Signed)
Stop taking omeprazole. Zantac as needed. Follow-up with your primary care physician   Abdominal Pain, Adult Many things can cause abdominal pain. Usually, abdominal pain is not caused by a disease and will improve without treatment. It can often be observed and treated at home. Your health care provider will do a physical exam and possibly order blood tests and X-rays to help determine the seriousness of your pain. However, in many cases, more time must pass before a clear cause of the pain can be found. Before that point, your health care provider may not know if you need more testing or further treatment. HOME CARE INSTRUCTIONS Monitor your abdominal pain for any changes. The following actions may help to alleviate any discomfort you are experiencing:  Only take over-the-counter or prescription medicines as directed by your health care provider.  Do not take laxatives unless directed to do so by your health care provider.  Try a clear liquid diet (broth, tea, or water) as directed by your health care provider. Slowly move to a bland diet as tolerated. SEEK MEDICAL CARE IF:  You have unexplained abdominal pain.  You have abdominal pain associated with nausea or diarrhea.  You have pain when you urinate or have a bowel movement.  You experience abdominal pain that wakes you in the night.  You have abdominal pain that is worsened or improved by eating food.  You have abdominal pain that is worsened with eating fatty foods.  You have a fever. SEEK IMMEDIATE MEDICAL CARE IF:  Your pain does not go away within 2 hours.  You keep throwing up (vomiting).  Your pain is felt only in portions of the abdomen, such as the right side or the left lower portion of the abdomen.  You pass bloody or black tarry stools. MAKE SURE YOU:  Understand these instructions.  Will watch your condition.  Will get help right away if you are not doing well or get worse.   This information is not  intended to replace advice given to you by your health care provider. Make sure you discuss any questions you have with your health care provider.   Document Released: 04/01/2005 Document Revised: 03/13/2015 Document Reviewed: 03/01/2013 Elsevier Interactive Patient Education [employer].

## 2015-05-03 NOTE — ED Provider Notes (Signed)
CSN: [id number]     Arrival date & time 05/03/15  1859 History   First MD Initiated Contact with Patient 05/03/15 1919     Chief Complaint  Patient presents with  . Abdominal Pain      HPI  Patient presents for evaluation of abdominal pain. States that after she eats she gets left upper abdominal pain. She states her insurance quit paying for her Protonix 3 weeks ago and she was switched to Prilosec she attributed to this. She's having more frequent stools. However no blood in her stools no diarrhea. States she became nauseated earlier today but not now. No urinary symptoms.  In reviewing her chart she has had 2 prior episodes of colitis/ileitis per hospitalized one year ago for an episode of ileitis uncertain if inflammatory or infectious.   Past Medical History  Diagnosis Date  . Diabetes mellitus 2012    borderline diabetes, never on medicine   . Acid reflux 2000    HA with nexium   . Enteritis 04/23/2011  . Hypercholesteremia 2011  . Hypertension 1990s   . Ileitis 03/24/2014  . Chest pain, unspecified 03/25/2014    MI ruled out. CTA negative PE  . Sleep apnea   . Arthritis   . Anemia    Past Surgical History  Procedure Laterality Date  . Abdominal hysterectomy  1983   . Incontinence surgery  1990   . Tubal ligation  1983   . Cataract extraction w/phaco Right 01/01/2015    Procedure: CATARACT EXTRACTION PHACO AND INTRAOCULAR LENS PLACEMENT (IOC);  Surgeon: Rutherford Guys, MD;  Location: [facility];  Service: Ophthalmology;  Laterality: Right;  CDE:5.11  . Cataract extraction w/phaco Left 01/15/2015    Procedure: CATARACT EXTRACTION PHACO AND INTRAOCULAR LENS PLACEMENT (IOC);  Surgeon: Rutherford Guys, MD;  Location: [facility];  Service: Ophthalmology;  Laterality: Left;  CDE:4.11   Family History  Problem Relation Age of Onset  . Diabetes Mother   . Heart disease Mother   . Stroke Mother   . Diabetes Maternal Aunt   . Cancer Maternal Aunt 60    stomach   . Diabetes Maternal  Uncle   . Cancer Daughter 3    breast    Social History  Substance Use Topics  . Smoking status: Never Smoker   . Smokeless tobacco: Never Used  . Alcohol Use: No   OB History    Gravida Para Term Preterm AB TAB SAB Ectopic Multiple Living   3 3 3       2      Review of Systems  Constitutional: Negative for fever, chills, diaphoresis, appetite change and fatigue.  HENT: Negative for mouth sores, sore throat and trouble swallowing.   Eyes: Negative for visual disturbance.  Respiratory: Negative for cough, chest tightness, shortness of breath and wheezing.   Cardiovascular: Negative for chest pain.  Gastrointestinal: Positive for abdominal pain. Negative for nausea, vomiting, diarrhea and abdominal distention.  Endocrine: Negative for polydipsia, polyphagia and polyuria.  Genitourinary: Negative for dysuria, frequency and hematuria.  Musculoskeletal: Negative for gait problem.  Skin: Negative for color change, pallor and rash.  Neurological: Negative for dizziness, syncope, light-headedness and headaches.  Hematological: Does not bruise/bleed easily.  Psychiatric/Behavioral: Negative for behavioral problems and confusion.      Allergies  Latex; Lisinopril; Penicillins; and Sulfa antibiotics  Home Medications   Prior to Admission medications   Medication Sig Start Date End Date Taking? Authorizing Provider  acetaminophen (TYLENOL) 500 MG tablet Take 1,000 mg by  mouth every 6 (six) hours as needed for headache.   Yes Historical Provider, MD  aspirin EC 81 MG tablet Take 81 mg by mouth daily.   Yes Historical Provider, MD  atorvastatin (LIPITOR) 10 MG tablet Take 1 tablet (10 mg total) by mouth daily. 12/04/14  Yes Josalyn Funches, MD  docusate sodium (COLACE) 100 MG capsule Take 1 capsule (100 mg total) by mouth 2 (two) times daily as needed for mild constipation. 04/02/14  Yes Josalyn Funches, MD  hydrochlorothiazide (HYDRODIURIL) 25 MG tablet Take 1 tablet (25 mg total) by  mouth daily. 11/22/14  Yes Josalyn Funches, MD  metoprolol tartrate (LOPRESSOR) 25 MG tablet Take 1 tablet (25 mg total) by mouth 2 (two) times daily. 11/22/14  Yes Josalyn Funches, MD  omeprazole (PRILOSEC) 20 MG capsule Take 20 mg by mouth daily.   Yes Historical Provider, MD  albuterol (PROVENTIL HFA;VENTOLIN HFA) 108 (90 BASE) MCG/ACT inhaler Inhale 1-2 puffs into the lungs every 6 (six) hours as needed for wheezing or shortness of breath. 05/03/15   Tanna Furry, MD  HYDROcodone-acetaminophen (NORCO/VICODIN) 5-325 MG per tablet Take 2 tablets by mouth every 4 (four) hours as needed. Patient not taking: Reported on 05/03/2015 01/07/15   Tanna Furry, MD  pantoprazole (PROTONIX) 40 MG tablet Take 1 tablet (40 mg total) by mouth daily. Treats acid reflux. Patient not taking: Reported on 05/03/2015 12/04/14   Boykin Nearing, MD  ranitidine (ZANTAC) 150 MG tablet Take 1 tablet (150 mg total) by mouth 2 (two) times daily. 05/03/15   Tanna Furry, MD   BP 161/94 mmHg  Pulse 67  Temp(Src) 98 F (36.7 C) (Oral)  Resp 18  Ht 5\' 3"  (1.6 m)  Wt 225 lb (102.059 kg)  BMI 39.87 kg/m2  SpO2 100% Physical Exam  Constitutional: She is oriented to person, place, and time. She appears well-developed and well-nourished. No distress.  HENT:  Head: Normocephalic.  Eyes: Conjunctivae are normal. Pupils are equal, round, and reactive to light. No scleral icterus.  Neck: Normal range of motion. Neck supple. No thyromegaly present.  Cardiovascular: Normal rate and regular rhythm.  Exam reveals no gallop and no friction rub.   No murmur heard. Pulmonary/Chest: Effort normal and breath sounds normal. No respiratory distress. She has no wheezes. She has no rales.  Mild prolongation in all fields.  Abdominal: Soft. Bowel sounds are normal. She exhibits no distension. There is no tenderness. There is no rebound.    Complains of left upper quadrant tenderness. No peritoneal irritation. It is inconsistently reproducible.    Musculoskeletal: Normal range of motion.  Neurological: She is alert and oriented to person, place, and time.  Skin: Skin is warm and dry. No rash noted.  Psychiatric: She has a normal mood and affect. Her behavior is normal.    ED Course  Procedures (including critical care time) Labs Review Labs Reviewed  URINALYSIS, [place] (NOT AT [facility]) - Abnormal; Notable for the following:    Hgb urine dipstick TRACE (*)    All other components within normal limits  CBC WITH DIFFERENTIAL/PLATELET - Abnormal; Notable for the following:    Hemoglobin 11.5 (*)    HCT 35.4 (*)    All other components within normal limits  COMPREHENSIVE METABOLIC PANEL - Abnormal; Notable for the following:    Potassium 3.3 (*)    Chloride 98 (*)    Glucose, Bld 138 (*)    All other components within normal limits  LIPASE, BLOOD  URINE MICROSCOPIC-ADD  ON    Imaging Review Ct Abdomen Pelvis W Contrast  05/03/2015  CLINICAL DATA:  Intermittent left lower quadrant pain for 3 weeks, more severe today EXAM: CT ABDOMEN AND PELVIS WITH CONTRAST TECHNIQUE: Multidetector CT imaging of the abdomen and pelvis was performed using the standard protocol following bolus administration of intravenous contrast. Oral contrast was also administered. CONTRAST:  7mL OMNIPAQUE IOHEXOL 300 MG/ML SOLN, 170mL OMNIPAQUE IOHEXOL 300 MG/ML SOLN COMPARISON:  March 24, 2014 FINDINGS: Lower chest:  Lung bases are clear.  There is a small hiatal hernia. Hepatobiliary: Liver is prominent, measuring 18.6 cm in length. There is mild hepatic steatosis. No focal liver lesions are identified. Gallbladder wall is not appreciably thickened. There is no biliary duct dilatation. Pancreas: No mass or inflammatory focus. Spleen: No splenic lesions are identified. Adrenals/Urinary Tract: Adrenals appear unremarkable bilaterally. There are multiple left renal cysts, largest measuring 6.7 x 6.4 cm. There is no hydronephrosis on either  side. There is no renal or ureteral calculus on either side. Urinary bladder is midline with wall thickness within normal limits. Stomach/Bowel: There is no bowel wall or mesenteric thickening. No bowel obstruction. No free air or portal venous air. There is no appreciable diverticulitis. Vascular/Lymphatic: There is no abdominal aortic aneurysm. No vascular lesions are appreciable. There is no adenopathy in the abdomen or pelvis. Reproductive: Uterus is absent. There is no pelvic mass or pelvic fluid collection. Other: Appendix appears unremarkable. There is a small ventral hernia containing only fat. No abscess or ascites present in the abdomen or pelvis. Musculoskeletal: There is vacuum phenomenon at L5-S1. There is facet osteoarthritic change in the lumbar region, most severe at L5-S1. No blastic or lytic bone lesions are identified. There is no intramuscular or abdominal wall lesion. IMPRESSION: No bowel wall or mesenteric thickening. No bowel obstruction. No diverticulitis. Appendix appears normal. No abscess. Small hiatal hernia.  Small ventral hernia containing only fat. Prominent liver with mild hepatic steatosis. No renal or ureteral calculus. No hydronephrosis. Multiple renal cysts on the left, stable. Uterus absent. Electronically Signed   By: Lowella Grip III M.D.   On: 05/03/2015 21:49   I have personally reviewed and evaluated these images and lab results as part of my medical decision-making.   EKG Interpretation None      MDM   Final diagnoses:  Generalized abdominal pain    Wheezing. A symptomatically. Clears with treatment. CT shows no acute findings. Plan will be. Omeprazole. Zantac primary care follow-up.    Tanna Furry, MD 05/03/15 [phone]

## 2015-05-13 ENCOUNTER — Encounter: Payer: Self-pay | Admitting: Gastroenterology

## 2015-05-16 ENCOUNTER — Encounter: Payer: Self-pay | Admitting: Gastroenterology

## 2015-05-16 ENCOUNTER — Telehealth: Payer: Self-pay | Admitting: Gastroenterology

## 2015-05-16 ENCOUNTER — Emergency Department (HOSPITAL_COMMUNITY)
Admission: EM | Admit: 2015-05-16 | Discharge: 2015-05-16 | Disposition: A | Discharge status: Home / Self Care | Payer: Medicare Other | Attending: Emergency Medicine | Admitting: Emergency Medicine

## 2015-05-16 ENCOUNTER — Emergency Department (HOSPITAL_COMMUNITY): Payer: Medicare Other

## 2015-05-16 ENCOUNTER — Encounter (HOSPITAL_COMMUNITY): Payer: Self-pay

## 2015-05-16 DIAGNOSIS — Z862 Personal history of diseases of the blood and blood-forming organs and certain disorders involving the immune mechanism: Secondary | ICD-10-CM | POA: Diagnosis not present

## 2015-05-16 DIAGNOSIS — Z880 Allergy status to penicillin: Secondary | ICD-10-CM | POA: Insufficient documentation

## 2015-05-16 DIAGNOSIS — M1990 Unspecified osteoarthritis, unspecified site: Secondary | ICD-10-CM | POA: Insufficient documentation

## 2015-05-16 DIAGNOSIS — K219 Gastro-esophageal reflux disease without esophagitis: Secondary | ICD-10-CM | POA: Insufficient documentation

## 2015-05-16 DIAGNOSIS — I10 Essential (primary) hypertension: Secondary | ICD-10-CM | POA: Insufficient documentation

## 2015-05-16 DIAGNOSIS — Z79899 Other long term (current) drug therapy: Secondary | ICD-10-CM | POA: Diagnosis not present

## 2015-05-16 DIAGNOSIS — Z7982 Long term (current) use of aspirin: Secondary | ICD-10-CM | POA: Insufficient documentation

## 2015-05-16 DIAGNOSIS — Z8669 Personal history of other diseases of the nervous system and sense organs: Secondary | ICD-10-CM | POA: Insufficient documentation

## 2015-05-16 DIAGNOSIS — R0602 Shortness of breath: Secondary | ICD-10-CM | POA: Diagnosis present

## 2015-05-16 DIAGNOSIS — J40 Bronchitis, not specified as acute or chronic: Secondary | ICD-10-CM

## 2015-05-16 DIAGNOSIS — J209 Acute bronchitis, unspecified: Secondary | ICD-10-CM | POA: Insufficient documentation

## 2015-05-16 DIAGNOSIS — J9801 Acute bronchospasm: Secondary | ICD-10-CM

## 2015-05-16 DIAGNOSIS — E7800 Pure hypercholesterolemia, unspecified: Secondary | ICD-10-CM | POA: Diagnosis not present

## 2015-05-16 LAB — BASIC METABOLIC PANEL
ANION GAP: 11 (ref 5–15)
BUN: 16 mg/dL (ref 6–20)
CO2: 29 mmol/L (ref 22–32)
Calcium: 9.6 mg/dL (ref 8.9–10.3)
Chloride: 99 mmol/L — ABNORMAL LOW (ref 101–111)
Creatinine, Ser: 1.04 mg/dL — ABNORMAL HIGH (ref 0.44–1.00)
GFR, EST NON AFRICAN AMERICAN: 55 mL/min — AB (ref >60)
Glucose, Bld: 111 mg/dL — ABNORMAL HIGH (ref 65–99)
POTASSIUM: 3.6 mmol/L (ref 3.5–5.1)
SODIUM: 139 mmol/L (ref 135–145)

## 2015-05-16 LAB — CBC WITH DIFFERENTIAL/PLATELET
BASOS ABS: 0 10*3/uL (ref 0.0–0.1)
BASOS PCT: 0 %
EOS PCT: 3 %
Eosinophils Absolute: 0.3 10*3/uL (ref 0.0–0.7)
HCT: 39.9 % (ref 36.0–46.0)
Hemoglobin: 13.3 g/dL (ref 12.0–15.0)
LYMPHS PCT: 27 %
Lymphs Abs: 2.1 10*3/uL (ref 0.7–4.0)
MCH: 27.3 pg (ref 26.0–34.0)
MCHC: 33.3 g/dL (ref 30.0–36.0)
MCV: 81.9 fL (ref 78.0–100.0)
Monocytes Absolute: 0.4 10*3/uL (ref 0.1–1.0)
Monocytes Relative: 5 %
Neutro Abs: 5.2 10*3/uL (ref 1.7–7.7)
Neutrophils Relative %: 65 %
PLATELETS: 352 10*3/uL (ref 150–400)
RBC: 4.87 MIL/uL (ref 3.87–5.11)
RDW: 14.1 % (ref 11.5–15.5)
WBC: 8 10*3/uL (ref 4.0–10.5)

## 2015-05-16 LAB — HEPATIC FUNCTION PANEL
ALBUMIN: 4.2 g/dL (ref 3.5–5.0)
ALT: 14 U/L (ref 14–54)
AST: 28 U/L (ref 15–41)
Alkaline Phosphatase: 117 U/L (ref 38–126)
Bilirubin, Direct: 0.2 mg/dL (ref 0.1–0.5)
Indirect Bilirubin: 0.8 mg/dL (ref 0.3–0.9)
TOTAL PROTEIN: 9 g/dL — AB (ref 6.5–8.1)
Total Bilirubin: 1 mg/dL (ref 0.3–1.2)

## 2015-05-16 MED ORDER — ACETAMINOPHEN 500 MG PO TABS
1000.0000 mg | ORAL_TABLET | Freq: Once | ORAL | Status: AC
  Administered 2015-05-16: 1000 mg via ORAL
  Filled 2015-05-16: qty 2

## 2015-05-16 MED ORDER — IPRATROPIUM-ALBUTEROL 0.5-2.5 (3) MG/3ML IN SOLN
3.0000 mL | Freq: Once | RESPIRATORY_TRACT | Status: AC
  Administered 2015-05-16: 3 mL via RESPIRATORY_TRACT
  Filled 2015-05-16: qty 3

## 2015-05-16 MED ORDER — PREDNISONE 50 MG PO TABS
60.0000 mg | ORAL_TABLET | Freq: Once | ORAL | Status: AC
  Administered 2015-05-16: 60 mg via ORAL
  Filled 2015-05-16 (×2): qty 1

## 2015-05-16 MED ORDER — PREDNISONE 10 MG PO TABS: 20.0000 mg | ORAL_TABLET | Freq: Every day | ORAL | Status: DC

## 2015-05-16 MED ORDER — ALBUTEROL SULFATE HFA 108 (90 BASE) MCG/ACT IN AERS
2.0000 | INHALATION_SPRAY | Freq: Once | RESPIRATORY_TRACT | Status: AC
  Administered 2015-05-16: 2 via RESPIRATORY_TRACT
  Filled 2015-05-16: qty 6.7

## 2015-05-16 MED ORDER — AZITHROMYCIN 250 MG PO TABS: ORAL_TABLET | ORAL | Status: DC

## 2015-05-16 MED ORDER — ALBUTEROL SULFATE (2.5 MG/3ML) 0.083% IN NEBU
2.5000 mg | INHALATION_SOLUTION | Freq: Once | RESPIRATORY_TRACT | Status: AC
  Administered 2015-05-16: 2.5 mg via RESPIRATORY_TRACT
  Filled 2015-05-16: qty 3

## 2015-05-16 MED ORDER — HYDROCOD POLST-CPM POLST ER 10-8 MG/5ML PO SUER
5.0000 mL | Freq: Once | ORAL | Status: AC
  Administered 2015-05-16: 5 mL via ORAL
  Filled 2015-05-16: qty 5

## 2015-05-16 NOTE — Telephone Encounter (Signed)
PT NEEDS AN APPT WITHIN THE NEXT 2 WEEKS FOR E30 ABDOMINAL PAIN.

## 2015-05-16 NOTE — ED Provider Notes (Signed)
CSN: [id number]     Arrival date & time 05/16/15  1241 History  By signing my name below, I, Sandra [name], attest that this documentation has been prepared under the direction and in the presence of Milton Ferguson, MD. Electronically Signed: Hilda [name], ED Scribe. 05/16/2015. 1:02 PM.     Chief Complaint  Patient presents with  . Shortness of Breath     Patient is a 64 y.o. female presenting with shortness of breath. The history is provided by the patient. No language interpreter was used.  Shortness of Breath Severity:  Moderate Onset quality:  Gradual Duration:  1 week Timing:  Constant Progression:  Worsening Chronicity:  New Relieved by:  Nothing Worsened by:  Deep breathing Ineffective treatments:  None tried Associated symptoms: cough and wheezing    HPI Comments: Sandra [name] is a 64 y.o. female who presents to the Emergency Department complaining of a constant, worsening productive cough with associated SOB and wheezing that has been present since last week. Pt reports her sputum is yellow and brown. Pt denies doing anything or taking medication to treat her symptoms.   Past Medical History  Diagnosis Date  . Diabetes mellitus 2012    borderline diabetes, never on medicine   . Acid reflux 2000    HA with nexium   . Enteritis 04/23/2011  . Hypercholesteremia 2011  . Hypertension 1990s   . Ileitis 03/24/2014  . Chest pain, unspecified 03/25/2014    MI ruled out. CTA negative PE  . Sleep apnea   . Arthritis   . Anemia    Past Surgical History  Procedure Laterality Date  . Abdominal hysterectomy  1983   . Incontinence surgery  1990   . Tubal ligation  1983   . Cataract extraction w/phaco Right 01/01/2015    Procedure: CATARACT EXTRACTION PHACO AND INTRAOCULAR LENS PLACEMENT (IOC);  Surgeon: Rutherford Guys, MD;  Location: [facility];  Service: Ophthalmology;  Laterality: Right;  CDE:5.11  . Cataract extraction w/phaco Left 01/15/2015    Procedure: CATARACT  EXTRACTION PHACO AND INTRAOCULAR LENS PLACEMENT (IOC);  Surgeon: Rutherford Guys, MD;  Location: [facility];  Service: Ophthalmology;  Laterality: Left;  CDE:4.11   Family History  Problem Relation Age of Onset  . Diabetes Mother   . Heart disease Mother   . Stroke Mother   . Diabetes Maternal Aunt   . Cancer Maternal Aunt 60    stomach   . Diabetes Maternal Uncle   . Cancer Daughter 84    breast    Social History  Substance Use Topics  . Smoking status: Never Smoker   . Smokeless tobacco: Never Used  . Alcohol Use: No   OB History    Gravida Para Term Preterm AB TAB SAB Ectopic Multiple Living   3 3 3       2      Review of Systems  Respiratory: Positive for cough, shortness of breath and wheezing.   All other systems reviewed and are negative.     Allergies  Latex; Lisinopril; Penicillins; and Sulfa antibiotics  Home Medications   Prior to Admission medications   Medication Sig Start Date End Date Taking? Authorizing Provider  acetaminophen (TYLENOL) 500 MG tablet Take 1,000 mg by mouth every 6 (six) hours as needed for headache.    Historical Provider, MD  albuterol (PROVENTIL HFA;VENTOLIN HFA) 108 (90 BASE) MCG/ACT inhaler Inhale 1-2 puffs into the lungs every 6 (six) hours as needed for wheezing or shortness of breath. 05/03/15  Tanna Furry, MD  aspirin EC 81 MG tablet Take 81 mg by mouth daily.    Historical Provider, MD  atorvastatin (LIPITOR) 10 MG tablet Take 1 tablet (10 mg total) by mouth daily. 12/04/14   Josalyn Funches, MD  docusate sodium (COLACE) 100 MG capsule Take 1 capsule (100 mg total) by mouth 2 (two) times daily as needed for mild constipation. 04/02/14   Josalyn Funches, MD  hydrochlorothiazide (HYDRODIURIL) 25 MG tablet Take 1 tablet (25 mg total) by mouth daily. 11/22/14   Boykin Nearing, MD  HYDROcodone-acetaminophen (NORCO/VICODIN) 5-325 MG per tablet Take 2 tablets by mouth every 4 (four) hours as needed. Patient not taking: Reported on 05/03/2015  01/07/15   Tanna Furry, MD  metoprolol tartrate (LOPRESSOR) 25 MG tablet Take 1 tablet (25 mg total) by mouth 2 (two) times daily. 11/22/14   Josalyn Funches, MD  omeprazole (PRILOSEC) 20 MG capsule Take 20 mg by mouth daily.    Historical Provider, MD  pantoprazole (PROTONIX) 40 MG tablet Take 1 tablet (40 mg total) by mouth daily. Treats acid reflux. Patient not taking: Reported on 05/03/2015 12/04/14   Boykin Nearing, MD  ranitidine (ZANTAC) 150 MG tablet Take 1 tablet (150 mg total) by mouth 2 (two) times daily. 05/03/15   Tanna Furry, MD   BP 177/83 mmHg  Pulse 93  Temp(Src) 98.7 F (37.1 C) (Oral)  Resp 40  Ht 5\' 3"  (1.6 m)  Wt 225 lb (102.059 kg)  BMI 39.87 kg/m2  SpO2 96% Physical Exam  Constitutional: She is oriented to person, place, and time. She appears well-developed.  HENT:  Head: Normocephalic.  Eyes: Conjunctivae and EOM are normal. No scleral icterus.  Neck: Neck supple. No thyromegaly present.  Cardiovascular: Normal rate and regular rhythm.  Exam reveals no gallop and no friction rub.   No murmur heard. Pulmonary/Chest: No stridor. She has wheezes. She has no rales. She exhibits no tenderness.  Moderate wheezing throughout  Abdominal: She exhibits no distension. There is no tenderness. There is no rebound.  Musculoskeletal: Normal range of motion. She exhibits no edema.  Lymphadenopathy:    She has no cervical adenopathy.  Neurological: She is oriented to person, place, and time. She exhibits normal muscle tone. Coordination normal.  Skin: No rash noted. No erythema.  Psychiatric: She has a normal mood and affect. Her behavior is normal.    ED Course  Procedures (including critical care time)  DIAGNOSTIC STUDIES: Oxygen Saturation is 96% on room air, normal by my interpretation.    COORDINATION OF CARE: 12:58 PM Discussed treatment plan with pt at bedside and pt agreed to plan.   Labs Review Labs Reviewed  CBC WITH DIFFERENTIAL/PLATELET  BASIC METABOLIC  PANEL    Imaging Review No results found. I have personally reviewed and evaluated these images and lab results as part of my medical decision-making.   EKG Interpretation None      MDM   Final diagnoses:  None    Patient improved with treatment. She will be sent home with albuterol prednisone and Z-Pak she is to follow-up with her M.D. next week  Milton Ferguson, MD 05/16/15 [phone]

## 2015-05-16 NOTE — ED Notes (Signed)
Respiratory paged for breathing tx. 

## 2015-05-16 NOTE — ED Notes (Signed)
Pt c/o productive cough with brown sputum at times and sob since last week.  Pt has audible wheezes.

## 2015-05-16 NOTE — Discharge Instructions (Signed)
Follow up with your md next week. °

## 2015-06-06 ENCOUNTER — Ambulatory Visit: Payer: Medicare Other | Admitting: Gastroenterology

## 2015-06-11 ENCOUNTER — Encounter: Payer: Self-pay | Admitting: Gastroenterology

## 2015-06-26 NOTE — Telephone Encounter (Signed)
error 

## 2015-07-10 ENCOUNTER — Ambulatory Visit: Payer: Medicare Other | Admitting: Gastroenterology

## 2015-07-18 ENCOUNTER — Other Ambulatory Visit: Payer: Self-pay

## 2015-07-18 ENCOUNTER — Ambulatory Visit (INDEPENDENT_AMBULATORY_CARE_PROVIDER_SITE_OTHER): Payer: Medicare Other | Admitting: Gastroenterology

## 2015-07-18 ENCOUNTER — Encounter: Payer: Self-pay | Admitting: Gastroenterology

## 2015-07-18 VITALS — BP 180/89 | HR 66 | Temp 97.6°F | Ht 65.0 in | Wt 224.0 lb

## 2015-07-18 DIAGNOSIS — R1013 Epigastric pain: Secondary | ICD-10-CM | POA: Diagnosis not present

## 2015-07-18 DIAGNOSIS — K219 Gastro-esophageal reflux disease without esophagitis: Secondary | ICD-10-CM | POA: Diagnosis not present

## 2015-07-18 DIAGNOSIS — R109 Unspecified abdominal pain: Secondary | ICD-10-CM

## 2015-07-18 DIAGNOSIS — K625 Hemorrhage of anus and rectum: Secondary | ICD-10-CM

## 2015-07-18 DIAGNOSIS — R1310 Dysphagia, unspecified: Secondary | ICD-10-CM

## 2015-07-18 DIAGNOSIS — J45909 Unspecified asthma, uncomplicated: Secondary | ICD-10-CM

## 2015-07-18 MED ORDER — PANTOPRAZOLE SODIUM 40 MG PO TBEC: DELAYED_RELEASE_TABLET | ORAL | Status: DC

## 2015-07-18 MED ORDER — PEG 3350-KCL-NA BICARB-NACL 420 G PO SOLR: 4000.0000 mL | Freq: Once | ORAL | Status: DC

## 2015-07-18 MED ORDER — ESOMEPRAZOLE MAGNESIUM 40 MG PO CPDR: DELAYED_RELEASE_CAPSULE | ORAL | Status: DC

## 2015-07-18 NOTE — Progress Notes (Signed)
cc'ed to pcp °

## 2015-07-18 NOTE — Assessment & Plan Note (Signed)
ASSOCIATED WITH RARE CONSTIPATION. MAY BE DUE TO POLYP OR HEMORRHOIDS, LESS LIKELY COLON CANCER, OR AVM.  COLONOSCOPY IN 2-3 WEEKS. DISCUSSED PROCEDURE, BENEFITS, & RISKS: < 1% chance of medication reaction, bleeding, perforation, or rupture of spleen/liver. EAT FIBER DRINK WATER  OPV IN 4 MOS

## 2015-07-18 NOTE — Patient Instructions (Addendum)
CONTINUE YOUR WEIGHT LOSS EFFORTS. LOSE 20 POUNDS  DRINK WATER TO KEEP YOUR URINE LIGHT YELLOW.  FOLLOW A HIGH FIBER/LOW FAT DIET. SEE INFO BELOW. MEATS SHOULD BE BAKED, BROILED, OR BOILED. AVOID FRIED FOODS AND AVOID ITEMS THAT CAUSE BLOATING & GAS.   START PROTONIX. TAKE 30 MINUTES BEFORE YOUR FIRST MEAL TO TREAT REFLUX. STOP USING BAKING SODA.  COMPLETE COLONOSCOPY TO EVALUATE YOUR RECTAL BLEEDING AND UPPER ENDOSCOPY TO EVALUATE YOUR SWALLOWING PROBLEMS AND UPPER ABDOMINAL PAIN IN 2-3 WEEKS.  SEE DR. HAWKINS THE LUNG DOCTOR ABOUT YOUR BREATHING PROBLEMS.  FOLLOW UP IN 4 MOS.   High-Fiber Diet A high-fiber diet changes your normal diet to include more whole grains, legumes, fruits, and vegetables. Changes in the diet involve replacing refined carbohydrates with unrefined foods. The calorie level of the diet is essentially unchanged. The Dietary Reference Intake (recommended amount) for adult males is 38 grams per day. For adult females, it is 25 grams per day. Pregnant and lactating women should consume 28 grams of fiber per day. Fiber is the intact part of a plant that is not broken down during digestion. Functional fiber is fiber that has been isolated from the plant to provide a beneficial effect in the body. PURPOSE  Increase stool bulk.   Ease and regulate bowel movements.   Lower cholesterol.  REDUCE RISK OF COLON CANCER  INDICATIONS THAT YOU NEED MORE FIBER  Constipation and hemorrhoids.   Uncomplicated diverticulosis (intestine condition) and irritable bowel syndrome.   Weight management.   As a protective measure against hardening of the arteries (atherosclerosis), diabetes, and cancer.   GUIDELINES FOR INCREASING FIBER IN THE DIET  Start adding fiber to the diet slowly. A gradual increase of about 5 more grams (2 slices of whole-wheat bread, 2 servings of most fruits or vegetables, or 1 bowl of high-fiber cereal) per day is best. Too rapid an increase in fiber may  result in constipation, flatulence, and bloating.   Drink enough water and fluids to keep your urine clear or pale yellow. Water, juice, or caffeine-free drinks are recommended. Not drinking enough fluid may cause constipation.   Eat a variety of high-fiber foods rather than one type of fiber.   Try to increase your intake of fiber through using high-fiber foods rather than fiber pills or supplements that contain small amounts of fiber.   The goal is to change the types of food eaten. Do not supplement your present diet with high-fiber foods, but replace foods in your present diet.   INCLUDE A VARIETY OF FIBER SOURCES  Replace refined and processed grains with whole grains, canned fruits with fresh fruits, and incorporate other fiber sources. White rice, white breads, and most bakery goods contain little or no fiber.   Brown whole-grain rice, buckwheat oats, and many fruits and vegetables are all good sources of fiber. These include: broccoli, Brussels sprouts, cabbage, cauliflower, beets, sweet potatoes, white potatoes (skin on), carrots, tomatoes, eggplant, squash, berries, fresh fruits, and dried fruits.   Cereals appear to be the richest source of fiber. Cereal fiber is found in whole grains and bran. Bran is the fiber-rich outer coat of cereal grain, which is largely removed in refining. In whole-grain cereals, the bran remains. In breakfast cereals, the largest amount of fiber is found in those with "bran" in their names. The fiber content is sometimes indicated on the label.   You may need to include additional fruits and vegetables each day.   In baking, for 1 cup  white flour, you may use the following substitutions:   1 cup whole-wheat flour minus 2 tablespoons.   1/2 cup white flour plus 1/2 cup whole-wheat flour.   Low-Fat Diet BREADS, CEREALS, PASTA, RICE, DRIED PEAS, AND BEANS These products are high in carbohydrates and most are low in fat. Therefore, they can be increased  in the diet as substitutes for fatty foods. They too, however, contain calories and should not be eaten in excess. Cereals can be eaten for snacks as well as for breakfast.  Include foods that contain fiber (fruits, vegetables, whole grains, and legumes). Research shows that fiber may lower blood cholesterol levels, especially the water-soluble fiber found in fruits, vegetables, oat products, and legumes. FRUITS AND VEGETABLES It is good to eat fruits and vegetables. Besides being sources of fiber, both are rich in vitamins and some minerals. They help you get the daily allowances of these nutrients. Fruits and vegetables can be used for snacks and desserts. MEATS Limit lean meat, chicken, [place], and fish to no more than 6 ounces per day. Beef, Pork, and Lamb Use lean cuts of beef, pork, and lamb. Lean cuts include:  Extra-lean ground beef.  Arm roast.  Sirloin tip.  Center-cut ham.  Round steak.  Loin chops.  Rump roast.  Tenderloin.  Trim all fat off the outside of meats before cooking. It is not necessary to severely decrease the intake of red meat, but lean choices should be made. Lean meat is rich in protein and contains a highly absorbable form of iron. Premenopausal women, in particular, should avoid reducing lean red meat because this could increase the risk for low red blood cells (iron-deficiency anemia). Processed Meats Processed meats, such as bacon, bologna, salami, sausage, and hot dogs contain large quantities of fat, are not rich in valuable nutrients, and should not be eaten very often. Organ Meats The organ meats, such as liver, sweetbreads, kidneys, and brain are very rich in cholesterol. They should be limited. Chicken and [place] These are good sources of protein. The fat of poultry can be reduced by removing the skin and underlying fat layers before cooking. Chicken and [place] can be substituted for lean red meat in the diet. Poultry should not be fried or covered with  high-fat sauces. Fish and Shellfish Fish is a good source of protein. Shellfish contain cholesterol, but they usually are low in saturated fatty acids. The preparation of fish is important. Like chicken and [place], they should not be fried or covered with high-fat sauces. EGGS Egg yolks often are hidden in cooked and processed foods. Egg whites contain no fat or cholesterol. They can be eaten often. Try 1 to 2 egg whites instead of whole eggs in recipes or use egg substitutes that do not contain yolk. MILK AND DAIRY PRODUCTS Use skim or 1% milk instead of 2% or whole milk. Decrease whole milk, natural, and processed cheeses. Use nonfat or low-fat (2%) cottage cheese or low-fat cheeses made from vegetable oils. Choose nonfat or low-fat (1 to 2%) yogurt. Experiment with evaporated skim milk in recipes that call for heavy cream. Substitute low-fat yogurt or low-fat cottage cheese for sour cream in dips and salad dressings. Have at least 2 servings of low-fat dairy products, such as 2 glasses of skim (or 1%) milk each day to help get your daily calcium intake. FATS AND OILS Reduce the total intake of fats, especially saturated fat. Butterfat, lard, and beef fats are high in saturated fat and cholesterol. These should be  avoided as much as possible. Vegetable fats do not contain cholesterol, but certain vegetable fats, such as coconut oil, palm oil, and palm kernel oil are very high in saturated fats. These should be limited. These fats are often used in bakery goods, processed foods, popcorn, oils, and nondairy creamers. Vegetable shortenings and some peanut butters contain hydrogenated oils, which are also saturated fats. Read the labels on these foods and check for saturated vegetable oils. Unsaturated vegetable oils and fats do not raise blood cholesterol. However, they should be limited because they are fats and are high in calories. Total fat should still be limited to 30% of your daily caloric intake.  Desirable liquid vegetable oils are corn oil, cottonseed oil, olive oil, canola oil, safflower oil, soybean oil, and sunflower oil. Peanut oil is not as good, but small amounts are acceptable. Buy a heart-healthy tub margarine that has no partially hydrogenated oils in the ingredients. Mayonnaise and salad dressings often are made from unsaturated fats, but they should also be limited because of their high calorie and fat content. Seeds, nuts, peanut butter, olives, and avocados are high in fat, but the fat is mainly the unsaturated type. These foods should be limited mainly to avoid excess calories and fat. OTHER EATING TIPS Snacks  Most sweets should be limited as snacks. They tend to be rich in calories and fats, and their caloric content outweighs their nutritional value. Some good choices in snacks are graham crackers, melba toast, soda crackers, bagels (no egg), English muffins, fruits, and vegetables. These snacks are preferable to snack crackers, [place] fries, and chips. Popcorn should be air-popped or cooked in small amounts of liquid vegetable oil. Desserts Eat fruit, low-fat yogurt, and fruit ices instead of pastries, cake, and cookies. Sherbet, angel food cake, gelatin dessert, frozen low-fat yogurt, or other frozen products that do not contain saturated fat (pure fruit juice bars, frozen ice pops) are also acceptable.  COOKING METHODS Choose those methods that use little or no fat. They include: Poaching.  Braising.  Steaming.  Grilling.  Baking.  Stir-frying.  Broiling.  Microwaving.  Foods can be cooked in a nonstick pan without added fat, or use a nonfat cooking spray in regular cookware. Limit fried foods and avoid frying in saturated fat. Add moisture to lean meats by using water, broth, cooking wines, and other nonfat or low-fat sauces along with the cooking methods mentioned above. Soups and stews should be chilled after cooking. The fat that forms on top after a few hours in  the refrigerator should be skimmed off. When preparing meals, avoid using excess salt. Salt can contribute to raising blood pressure in some people. EATING AWAY FROM HOME Order entres, potatoes, and vegetables without sauces or butter. When meat exceeds the size of a deck of cards (3 to 4 ounces), the rest can be taken home for another meal. Choose vegetable or fruit salads and ask for low-calorie salad dressings to be served on the side. Use dressings sparingly. Limit high-fat toppings, such as bacon, crumbled eggs, cheese, sunflower seeds, and olives. Ask for heart-healthy tub margarine instead of butter.

## 2015-07-18 NOTE — Progress Notes (Signed)
ON RECALL  °

## 2015-07-18 NOTE — Assessment & Plan Note (Signed)
  SYMPTOMS NOT IDEALLY CONTROLLED.  ADD PROTONIX QAC BREAKFAST LOW FAT DIET LOSE WEIGHT FOLLOW UP IN 4 MOS.

## 2015-07-18 NOTE — Assessment & Plan Note (Addendum)
SYMPTOMS NOT IDEALLY CONTROLLED. MOST LIKELY DUE TO GASTRITIS, GERD, ABDOMINAL WALL PAIN.  EGD/DIL TO EVALUATE. DISCUSSED PROCEDURE, BENEFITS, & RISKS: < 1% chance of medication reaction, OR bleeding. LOW FAT DIET STOP BAKING SODA I PERSONALLY REVIEWED THE CT OCT 2016 WITH DR. Thornton Papas.  ADD PROTONIX QAC BREAKFAST FOLLOW UP IN 4 MOS.

## 2015-07-18 NOTE — Progress Notes (Signed)
Subjective:    Patient ID: Sandra [name], female    DOB: 06-26-1951, 65 y.o.   MRN: [id number]  Robert Bellow, MD   HPI May have itching and sees blood when she wipes after constipation. THINKS SHE'S FINISHED AND FEELS LIKE SHE HAS TO GO BACK.WHEN SHE HAS TO GO HAS TO GO. GB IN. MILK: EATS CEREAL 3X/WEEK-DRY CEREAL BECAUSE MILK MAKES HER SICK. FEEL LIKE SHE'S GOING TO THROW UP EVERY DAY. CHEESE: CAN'T TOLERATE IT. ICE CREAM: ALL RIGHT. TAKE BAKING SODA EVERY AM TO KEEP HER FROM THROWING UP. HEARTBURN: ALMOST EVERY DAY BURNING FOR YEARS. WAS TAKING FOR REFLUX BUT AFFECTING HER BONES. STOPPED TAKING FOR PAST 3-4 MOS. WAS TAKING AND NOT DOING GOOD. ZANTAC WAS MAKING HER FEEL SICK AS WELL. FELT LIKE HAD A ROUND BALL THAT MOVED AROUND BUT STOPPED ABOUT 1 MO AGO. NEVER HAD A COLONOSCOPY/EGD. THROWS UP: ONCE A WEEK. NAUSEATED: NEAR;Y EVERY DAY. HAVING PROBLEMS WITH ASTHMA. NOT USING A SLEEP APNEA MACHINE: BACK IN 1993. WATERY STOOLS: DRINKING BAKING SODA(RARE). SOB: ALL THE TIME. NEBS HELP SOME. BMs: ONCE IN AM. HAS EPIGASTRIC PAIN FOR YEARS. LOSE AND GAINS WEIGHT. EATING BLUEBERRIES. PROBLEMS SWALLOWING: 2X/WEEK-FEELS LIEK FOOD GETS STUCK. GETS STRANGLED A LOT WHEN DRINKING LIQUIDS. BACK HURTS EVERY DAY DUE TO ARTHRITIS.   PT DENIES FEVER, CHILLS, HEMATEMESIS, melena, CHEST PAIN, CHANGE IN BOWEL IN HABITS, OR constipation.   Past Medical History  Diagnosis Date  . Diabetes mellitus 2012    borderline diabetes, never on medicine   . Acid reflux 2000    HA with nexium   . Enteritis 04/23/2011  . Hypercholesteremia 2011  . Hypertension 1990s   . Ileitis 03/24/2014  . Chest pain, unspecified 03/25/2014    MI ruled out. CTA negative PE  . Sleep apnea   . Arthritis   . Anemia    Past Surgical History  Procedure Laterality Date  . Abdominal hysterectomy  1983   . Incontinence surgery  1990   . Tubal ligation  1983   . Cataract extraction w/phaco BILATERAL 01/01/2015                 Allergies  Allergen Reactions  . Latex Itching  . Lisinopril Swelling    Tingling, swelling lips   . Penicillins Itching   Current Outpatient Prescriptions  Medication Sig Dispense Refill  . acetaminophen (TYLENOL) 500 MG tablet Take 1,000 mg by mouth every 6 (six) hours as needed for headache.    . albuterol (PROVENTIL HFA;VENTOLIN HFA) 108 (90 BASE) MCG/ACT inhaler Inhale 1-2 puffs into the lungs every 6 (six) hours as needed for wheezing or shortness of breath.    Marland Kitchen aspirin EC 81 MG tablet Take 81 mg by mouth daily.    Marland Kitchen atorvastatin (LIPITOR) 10 MG tablet Take 1 tablet (10 mg total) by mouth daily.    Marland Kitchen azithromycin (ZITHROMAX Z-PAK) 250 MG tablet 2 po day one, then 1 daily x 4 days    . calcium-vitamin D (OSCAL WITH D) 500-200 MG-UNIT tablet Take 1 tablet by mouth 2 (two) times daily.    . hydrochlorothiazide (HYDRODIURIL) 25 MG tablet Take 1 tablet (25 mg total) by mouth daily.    . metoprolol tartrate (LOPRESSOR) 25 MG tablet Take 1 tablet (25 mg total) by mouth 2 (two) times daily.    .      .       No current facility-administered medications for this visit.   Family History  Problem Relation Age  of Onset  . Diabetes Mother   . Heart disease Mother   . Stroke Mother   . Diabetes Maternal Aunt   . Cancer Maternal Aunt 60    stomach   . Diabetes Maternal Uncle   . Cancer Daughter 76    breast     Social History  Substance Use Topics  . Smoking status: Never Smoker   . Smokeless tobacco: Never Used  . Alcohol Use: No   Review of Systems PER HPI OTHERWISE ALL SYSTEMS ARE NEGATIVE.     Objective:   Physical Exam  Constitutional: She is oriented to person, place, and time. She appears well-developed and well-nourished. No distress.  HENT:  Head: Normocephalic and atraumatic.  Mouth/Throat: Oropharynx is clear and moist. No oropharyngeal exudate.  Eyes: Pupils are equal, round, and reactive to light. No scleral icterus.  Neck: Normal range of motion. Neck  supple.  Cardiovascular: Normal rate, regular rhythm and normal heart sounds.   Pulmonary/Chest: Effort normal. No respiratory distress. She has wheezes (diffuse end expiratory).  Abdominal: Soft. Bowel sounds are normal. She exhibits no distension. There is tenderness. There is no rebound and no guarding.  MILD TTP IN THE EPIGASTRIUM   Musculoskeletal: She exhibits no edema.  Lymphadenopathy:    She has no cervical adenopathy.  Neurological: She is alert and oriented to person, place, and time.  NO FOCAL DEFICITS  Psychiatric: She has a normal mood and affect.  Vitals reviewed.     Assessment & Plan:

## 2015-07-22 ENCOUNTER — Ambulatory Visit (HOSPITAL_COMMUNITY)
Admission: RE | Admit: 2015-07-22 | Discharge: 2015-07-22 | Disposition: A | Discharge status: Home / Self Care | Payer: Medicare Other | Source: Ambulatory Visit | Attending: Gastroenterology | Admitting: Gastroenterology

## 2015-07-22 ENCOUNTER — Encounter (HOSPITAL_COMMUNITY)
Admission: RE | Disposition: A | Discharge status: Home / Self Care | Payer: Self-pay | Source: Ambulatory Visit | Attending: Gastroenterology

## 2015-07-22 ENCOUNTER — Encounter (HOSPITAL_COMMUNITY): Payer: Self-pay

## 2015-07-22 DIAGNOSIS — E119 Type 2 diabetes mellitus without complications: Secondary | ICD-10-CM | POA: Diagnosis not present

## 2015-07-22 DIAGNOSIS — I10 Essential (primary) hypertension: Secondary | ICD-10-CM | POA: Diagnosis not present

## 2015-07-22 DIAGNOSIS — G4730 Sleep apnea, unspecified: Secondary | ICD-10-CM | POA: Insufficient documentation

## 2015-07-22 DIAGNOSIS — K648 Other hemorrhoids: Secondary | ICD-10-CM | POA: Insufficient documentation

## 2015-07-22 DIAGNOSIS — E7800 Pure hypercholesterolemia, unspecified: Secondary | ICD-10-CM | POA: Insufficient documentation

## 2015-07-22 DIAGNOSIS — R1310 Dysphagia, unspecified: Secondary | ICD-10-CM | POA: Insufficient documentation

## 2015-07-22 DIAGNOSIS — Q394 Esophageal web: Secondary | ICD-10-CM | POA: Diagnosis not present

## 2015-07-22 DIAGNOSIS — Z79899 Other long term (current) drug therapy: Secondary | ICD-10-CM | POA: Diagnosis not present

## 2015-07-22 DIAGNOSIS — K219 Gastro-esophageal reflux disease without esophagitis: Secondary | ICD-10-CM | POA: Insufficient documentation

## 2015-07-22 DIAGNOSIS — K625 Hemorrhage of anus and rectum: Secondary | ICD-10-CM

## 2015-07-22 DIAGNOSIS — Q438 Other specified congenital malformations of intestine: Secondary | ICD-10-CM | POA: Insufficient documentation

## 2015-07-22 DIAGNOSIS — K2970 Gastritis, unspecified, without bleeding: Secondary | ICD-10-CM | POA: Diagnosis not present

## 2015-07-22 DIAGNOSIS — K921 Melena: Secondary | ICD-10-CM | POA: Insufficient documentation

## 2015-07-22 DIAGNOSIS — Z7982 Long term (current) use of aspirin: Secondary | ICD-10-CM | POA: Diagnosis not present

## 2015-07-22 DIAGNOSIS — K319 Disease of stomach and duodenum, unspecified: Secondary | ICD-10-CM | POA: Insufficient documentation

## 2015-07-22 DIAGNOSIS — M1990 Unspecified osteoarthritis, unspecified site: Secondary | ICD-10-CM | POA: Diagnosis not present

## 2015-07-22 DIAGNOSIS — D123 Benign neoplasm of transverse colon: Secondary | ICD-10-CM | POA: Diagnosis not present

## 2015-07-22 DIAGNOSIS — R109 Unspecified abdominal pain: Secondary | ICD-10-CM | POA: Insufficient documentation

## 2015-07-22 HISTORY — PX: COLONOSCOPY: SHX5424

## 2015-07-22 HISTORY — PX: ESOPHAGOGASTRODUODENOSCOPY: SHX5428

## 2015-07-22 HISTORY — PX: SAVORY DILATION: SHX5439

## 2015-07-22 SURGERY — COLONOSCOPY
Anesthesia: Moderate Sedation

## 2015-07-22 MED ORDER — LIDOCAINE VISCOUS 2 % MT SOLN
OROMUCOSAL | Status: DC | PRN
  Administered 2015-07-22: 4 mL via OROMUCOSAL

## 2015-07-22 MED ORDER — MINERAL OIL PO OIL
TOPICAL_OIL | ORAL | Status: AC
  Filled 2015-07-22: qty 30

## 2015-07-22 MED ORDER — SODIUM CHLORIDE 0.9 % IV SOLN
INTRAVENOUS | Status: DC
  Administered 2015-07-22: 12:00:00 via INTRAVENOUS

## 2015-07-22 MED ORDER — STERILE WATER FOR IRRIGATION IR SOLN
Status: DC | PRN
  Administered 2015-07-22: 13:00:00

## 2015-07-22 MED ORDER — MIDAZOLAM HCL 5 MG/5ML IJ SOLN
INTRAMUSCULAR | Status: AC
  Filled 2015-07-22: qty 10

## 2015-07-22 MED ORDER — MIDAZOLAM HCL 5 MG/5ML IJ SOLN
INTRAMUSCULAR | Status: DC | PRN
  Administered 2015-07-22: 1 mg via INTRAVENOUS
  Administered 2015-07-22 (×2): 2 mg via INTRAVENOUS

## 2015-07-22 MED ORDER — MEPERIDINE HCL 100 MG/ML IJ SOLN
INTRAMUSCULAR | Status: DC | PRN
  Administered 2015-07-22 (×4): 25 mg via INTRAVENOUS

## 2015-07-22 MED ORDER — MEPERIDINE HCL 100 MG/ML IJ SOLN
INTRAMUSCULAR | Status: AC
  Filled 2015-07-22: qty 2

## 2015-07-22 MED ORDER — LIDOCAINE VISCOUS 2 % MT SOLN
OROMUCOSAL | Status: AC
  Filled 2015-07-22: qty 15

## 2015-07-22 NOTE — Interval H&P Note (Signed)
History and Physical Interval Note:  07/22/2015 12:33 PM  Sandra [name]  has presented today for surgery, with the diagnosis of rectal bleeding, abdominal pain, dysphagia  The various methods of treatment have been discussed with the patient and family. After consideration of risks, benefits and other options for treatment, the patient has consented to  Procedure(s) with comments: COLONOSCOPY (N/A) - 1245pm ESOPHAGOGASTRODUODENOSCOPY (EGD) (N/A) SAVORY DILATION (N/A) as a surgical intervention .  The patient's history has been reviewed, patient examined, no change in status, stable for surgery.  I have reviewed the patient's chart and labs.  Questions were answered to the patient's satisfaction.     [employer]

## 2015-07-22 NOTE — H&P (View-Only) (Signed)
Subjective:    Patient ID: Sandra [name], female    DOB: 1950/07/17, 65 y.o.   MRN: [id number]  Robert Bellow, MD   HPI May have itching and sees blood when she wipes after constipation. THINKS SHE'S FINISHED AND FEELS LIKE SHE HAS TO GO BACK.WHEN SHE HAS TO GO HAS TO GO. GB IN. MILK: EATS CEREAL 3X/WEEK-DRY CEREAL BECAUSE MILK MAKES HER SICK. FEEL LIKE SHE'S GOING TO THROW UP EVERY DAY. CHEESE: CAN'T TOLERATE IT. ICE CREAM: ALL RIGHT. TAKE BAKING SODA EVERY AM TO KEEP HER FROM THROWING UP. HEARTBURN: ALMOST EVERY DAY BURNING FOR YEARS. WAS TAKING FOR REFLUX BUT AFFECTING HER BONES. STOPPED TAKING FOR PAST 3-4 MOS. WAS TAKING AND NOT DOING GOOD. ZANTAC WAS MAKING HER FEEL SICK AS WELL. FELT LIKE HAD A ROUND BALL THAT MOVED AROUND BUT STOPPED ABOUT 1 MO AGO. NEVER HAD A COLONOSCOPY/EGD. THROWS UP: ONCE A WEEK. NAUSEATED: NEAR;Y EVERY DAY. HAVING PROBLEMS WITH ASTHMA. NOT USING A SLEEP APNEA MACHINE: BACK IN 1993. WATERY STOOLS: DRINKING BAKING SODA(RARE). SOB: ALL THE TIME. NEBS HELP SOME. BMs: ONCE IN AM. HAS EPIGASTRIC PAIN FOR YEARS. LOSE AND GAINS WEIGHT. EATING BLUEBERRIES. PROBLEMS SWALLOWING: 2X/WEEK-FEELS LIEK FOOD GETS STUCK. GETS STRANGLED A LOT WHEN DRINKING LIQUIDS. BACK HURTS EVERY DAY DUE TO ARTHRITIS.   PT DENIES FEVER, CHILLS, HEMATEMESIS, melena, CHEST PAIN, CHANGE IN BOWEL IN HABITS, OR constipation.   Past Medical History  Diagnosis Date  . Diabetes mellitus 2012    borderline diabetes, never on medicine   . Acid reflux 2000    HA with nexium   . Enteritis 04/23/2011  . Hypercholesteremia 2011  . Hypertension 1990s   . Ileitis 03/24/2014  . Chest pain, unspecified 03/25/2014    MI ruled out. CTA negative PE  . Sleep apnea   . Arthritis   . Anemia    Past Surgical History  Procedure Laterality Date  . Abdominal hysterectomy  1983   . Incontinence surgery  1990   . Tubal ligation  1983   . Cataract extraction w/phaco BILATERAL 01/01/2015                 Allergies  Allergen Reactions  . Latex Itching  . Lisinopril Swelling    Tingling, swelling lips   . Penicillins Itching   Current Outpatient Prescriptions  Medication Sig Dispense Refill  . acetaminophen (TYLENOL) 500 MG tablet Take 1,000 mg by mouth every 6 (six) hours as needed for headache.    . albuterol (PROVENTIL HFA;VENTOLIN HFA) 108 (90 BASE) MCG/ACT inhaler Inhale 1-2 puffs into the lungs every 6 (six) hours as needed for wheezing or shortness of breath.    Marland Kitchen aspirin EC 81 MG tablet Take 81 mg by mouth daily.    Marland Kitchen atorvastatin (LIPITOR) 10 MG tablet Take 1 tablet (10 mg total) by mouth daily.    Marland Kitchen azithromycin (ZITHROMAX Z-PAK) 250 MG tablet 2 po day one, then 1 daily x 4 days    . calcium-vitamin D (OSCAL WITH D) 500-200 MG-UNIT tablet Take 1 tablet by mouth 2 (two) times daily.    . hydrochlorothiazide (HYDRODIURIL) 25 MG tablet Take 1 tablet (25 mg total) by mouth daily.    . metoprolol tartrate (LOPRESSOR) 25 MG tablet Take 1 tablet (25 mg total) by mouth 2 (two) times daily.    .      .       No current facility-administered medications for this visit.   Family History  Problem Relation Age  of Onset  . Diabetes Mother   . Heart disease Mother   . Stroke Mother   . Diabetes Maternal Aunt   . Cancer Maternal Aunt 60    stomach   . Diabetes Maternal Uncle   . Cancer Daughter 30    breast     Social History  Substance Use Topics  . Smoking status: Never Smoker   . Smokeless tobacco: Never Used  . Alcohol Use: No   Review of Systems PER HPI OTHERWISE ALL SYSTEMS ARE NEGATIVE.     Objective:   Physical Exam  Constitutional: She is oriented to person, place, and time. She appears well-developed and well-nourished. No distress.  HENT:  Head: Normocephalic and atraumatic.  Mouth/Throat: Oropharynx is clear and moist. No oropharyngeal exudate.  Eyes: Pupils are equal, round, and reactive to light. No scleral icterus.  Neck: Normal range of motion. Neck  supple.  Cardiovascular: Normal rate, regular rhythm and normal heart sounds.   Pulmonary/Chest: Effort normal. No respiratory distress. She has wheezes (diffuse end expiratory).  Abdominal: Soft. Bowel sounds are normal. She exhibits no distension. There is tenderness. There is no rebound and no guarding.  MILD TTP IN THE EPIGASTRIUM   Musculoskeletal: She exhibits no edema.  Lymphadenopathy:    She has no cervical adenopathy.  Neurological: She is alert and oriented to person, place, and time.  NO FOCAL DEFICITS  Psychiatric: She has a normal mood and affect.  Vitals reviewed.     Assessment & Plan:

## 2015-07-22 NOTE — Discharge Instructions (Signed)
You had 3 polyps removed. YOU HAVE A SMALL HIATAL HERNIA & GASTRITIS. I STRETCHED YOUR ESOPHAGUS DUE YOUR PROBLEMS SWALLOWING. I biopsied your stomach.   FOLLOW A HIGH FIBER DIET. AVOID ITEMS THAT CAUSE BLOATING. SEE INFO BELOW.  CONTINUE PROTONIX. TAKE 30 MINUTES PRIOR TO BREAKFAST.  YOUR BIOPSY RESULTS WILL BE AVAILABLE IN MY CHART JAN 19 AND MY OFFICE WILL CONTACT YOU IN 10-14 DAYS WITH YOUR RESULTS.   Follow up in MAY 2017.  Next colonoscopy in 3-5 years.   ENDOSCOPY Care After Read the instructions outlined below and refer to this sheet in the next week. These discharge instructions provide you with general information on caring for yourself after you leave the hospital. While your treatment has been planned according to the most current medical practices available, unavoidable complications occasionally occur. If you have any problems or questions after discharge, call DR. Lamone Ferrelli, [phone].  ACTIVITY  You may resume your regular activity, but move at a slower pace for the next 24 hours.   Take frequent rest periods for the next 24 hours.   Walking will help get rid of the air and reduce the bloated feeling in your belly (abdomen).   No driving for 24 hours (because of the medicine (anesthesia) used during the test).   You may shower.   Do not sign any important legal documents or operate any machinery for 24 hours (because of the anesthesia used during the test).    NUTRITION  Drink plenty of fluids.   You may resume your normal diet as instructed by your doctor.   Begin with a light meal and progress to your normal diet. Heavy or fried foods are harder to digest and may make you feel sick to your stomach (nauseated).   Avoid alcoholic beverages for 24 hours or as instructed.    MEDICATIONS  You may resume your normal medications.   WHAT YOU CAN EXPECT TODAY  Some feelings of bloating in the abdomen.   Passage of more gas than usual.   Spotting of  blood in your stool or on the toilet paper  .  IF YOU HAD POLYPS REMOVED DURING THE ENDOSCOPY:  Eat a soft diet IF YOU HAVE NAUSEA, BLOATING, ABDOMINAL PAIN, OR VOMITING.    FINDING OUT THE RESULTS OF YOUR TEST Not all test results are available during your visit. DR. Oneida Alar WILL CALL YOU WITHIN 14 DAYS OF YOUR PROCEDUE WITH YOUR RESULTS. Do not assume everything is normal if you have not heard from DR. Coley Kulikowski, CALL HER OFFICE AT [phone].  SEEK IMMEDIATE MEDICAL ATTENTION AND CALL THE OFFICE: [phone] IF:  You have more than a spotting of blood in your stool.   Your belly is swollen (abdominal distention).   You are nauseated or vomiting.   You have a temperature over 101F.   You have abdominal pain or discomfort that is severe or gets worse throughout the day.   High-Fiber Diet A high-fiber diet changes your normal diet to include more whole grains, legumes, fruits, and vegetables. Changes in the diet involve replacing refined carbohydrates with unrefined foods. The calorie level of the diet is essentially unchanged. The Dietary Reference Intake (recommended amount) for adult males is 38 grams per day. For adult females, it is 25 grams per day. Pregnant and lactating women should consume 28 grams of fiber per day. Fiber is the intact part of a plant that is not broken down during digestion. Functional fiber is fiber that has been isolated from the  plant to provide a beneficial effect in the body. PURPOSE  Increase stool bulk.   Ease and regulate bowel movements.   Lower cholesterol.  INDICATIONS THAT YOU NEED MORE FIBER  Constipation and hemorrhoids.   Uncomplicated diverticulosis (intestine condition) and irritable bowel syndrome.   Weight management.   As a protective measure against hardening of the arteries (atherosclerosis), diabetes, and cancer.   GUIDELINES FOR INCREASING FIBER IN THE DIET  Start adding fiber to the diet slowly. A gradual increase of  about 5 more grams (2 slices of whole-wheat bread, 2 servings of most fruits or vegetables, or 1 bowl of high-fiber cereal) per day is best. Too rapid an increase in fiber may result in constipation, flatulence, and bloating.   Drink enough water and fluids to keep your urine clear or pale yellow. Water, juice, or caffeine-free drinks are recommended. Not drinking enough fluid may cause constipation.   Eat a variety of high-fiber foods rather than one type of fiber.   Try to increase your intake of fiber through using high-fiber foods rather than fiber pills or supplements that contain small amounts of fiber.   The goal is to change the types of food eaten. Do not supplement your present diet with high-fiber foods, but replace foods in your present diet.  INCLUDE A VARIETY OF FIBER SOURCES  Replace refined and processed grains with whole grains, canned fruits with fresh fruits, and incorporate other fiber sources. White rice, white breads, and most bakery goods contain little or no fiber.   Brown whole-grain rice, buckwheat oats, and many fruits and vegetables are all good sources of fiber. These include: broccoli, Brussels sprouts, cabbage, cauliflower, beets, sweet potatoes, white potatoes (skin on), carrots, tomatoes, eggplant, squash, berries, fresh fruits, and dried fruits.   Cereals appear to be the richest source of fiber. Cereal fiber is found in whole grains and bran. Bran is the fiber-rich outer coat of cereal grain, which is largely removed in refining. In whole-grain cereals, the bran remains. In breakfast cereals, the largest amount of fiber is found in those with "bran" in their names. The fiber content is sometimes indicated on the label.   You may need to include additional fruits and vegetables each day.   In baking, for 1 cup white flour, you may use the following substitutions:   1 cup whole-wheat flour minus 2 tablespoons.   1/2 cup white flour plus 1/2 cup whole-wheat  flour.    Polyps, Colon  A polyp is extra tissue that grows inside your body. Colon polyps grow in the large intestine. The large intestine, also called the colon, is part of your digestive system. It is a long, hollow tube at the end of your digestive tract where your body makes and stores stool. Most polyps are not dangerous. They are benign. This means they are not cancerous. But over time, some types of polyps can turn into cancer. Polyps that are smaller than a pea are usually not harmful. But larger polyps could someday become or may already be cancerous. To be safe, doctors remove all polyps and test them.   WHO GETS POLYPS? Anyone can get polyps, but certain people are more likely than others. You may have a greater chance of getting polyps if:  You are over 50.   You have had polyps before.   Someone in your family has had polyps.   Someone in your family has had cancer of the large intestine.   Find out if someone in  your family has had polyps. You may also be more likely to get polyps if you:   Eat a lot of fatty foods   Smoke   Drink alcohol   Do not exercise  Eat too much   TREATMENT  The caregiver will remove the polyp during sigmoidoscopy or colonoscopy.  PREVENTION There is not one sure way to prevent polyps. You might be able to lower your risk of getting them if you:  Eat more fruits and vegetables and less fatty food.   Do not smoke.   Avoid alcohol.   Exercise every day.   Lose weight if you are overweight.   Eating more calcium and folate can also lower your risk of getting polyps. Some foods that are rich in calcium are milk, cheese, and broccoli. Some foods that are rich in folate are chickpeas, kidney beans, and spinach.    Hiatal Hernia A hiatal hernia occurs when a part of the stomach slides above the diaphragm. The diaphragm is the thin muscle separating the belly (abdomen) from the chest. A hiatal hernia can be something you are born with or  develop over time. Hiatal hernias may allow stomach acid to flow back into your esophagus, the tube which carries food from your mouth to your stomach. If this acid causes problems it is called GERD (gastro-esophageal reflux disease).   SYMPTOMS Common symptoms of GERD are heartburn (burning in your chest). This is worse when lying down or bending over. It may also cause belching and indigestion. Some of the things which make GERD worse are:  Increased weight pushes on stomach making acid rise more easily.   Smoking markedly increases acid production.   Alcohol decreases lower esophageal sphincter pressure (valve between stomach and esophagus), allowing acid from stomach into esophagus.   Late evening meals and going to bed with a full stomach increases pressure.   Anything that causes an increase in acid production.    HOME CARE INSTRUCTIONS  Try to achieve and maintain an ideal body weight.   Avoid drinking alcoholic beverages.   DO NOT smokE.   Do not wear tight clothing around your chest or stomach.   Eat smaller meals and eat more frequently. This keeps your stomach from getting too full. Eat slowly.   Do not lie down for 2 or 3 hours after eating. Do not eat or drink anything 1 to 2 hours before going to bed.   Avoid caffeine beverages (colas, coffee, cocoa, tea), fatty foods, citrus fruits and all other foods and drinks that contain acid and that seem to increase the problems.   Avoid bending over, especially after eating OR STRAINING. Anything that increases the pressure in your belly increases the amount of acid that may be pushed up into your esophagus.   Gastritis  Gastritis is an inflammation (the body's way of reacting to injury and/or infection) of the stomach. It is often caused by viral or bacterial (germ) infections. It can also be caused BY ASPIRIN, BC/GOODY POWDER'S, (IBUPROFEN) MOTRIN, OR ALEVE (NAPROXEN), chemicals (including alcohol), SPICY FOODS, and  medications. This illness may be associated with generalized malaise (feeling tired, not well), UPPER ABDOMINAL STOMACH cramps, and fever. One common bacterial cause of gastritis is an organism known as H. Pylori. This can be treated with antibiotics.

## 2015-07-22 NOTE — Op Note (Signed)
[facility] [address] [place], [address]   ENDOSCOPY PROCEDURE REPORT  PATIENT: Sandra, [name]  MR#: [id number] BIRTHDATE: 25-Apr-1951 , 61  yrs. old GENDER: female  ENDOSCOPIST: Danie Binder, MD REFFERED RI:[id number] Karie Kirks, M.D. PROCEDURE DATE:  08-14-2015 PROCEDURE:   EGD with biopsy and EGD with dilatation over guidewire   INDICATIONS:1.  dysphagia. MEDICATIONS: Demerol 25 mg IV  MD INITATED SEDATION: 1247. END OF ENDOSCOPY: 1335 TOPICAL ANESTHETIC: Viscous Xylocaine  DESCRIPTION OF PROCEDURE:   After the risks benefits and alternatives of the procedure were thoroughly explained, informed consent was obtained.  The [id number]  endoscope was introduced through the mouth and advanced to the second portion of the duodenum. The instrument was slowly withdrawn as the mucosa was carefully examined.  Prior to withdrawal of the scope, the guidwire was placed.  The esophagus was dilated successfully.  The patient was recovered in endoscopy and discharged home in satisfactory condition. Estimated blood loss is zero unless otherwise noted in this procedure report.   ESOPHAGUS: The mucosa of the esophagus appeared normal.   STOMACH: Mild non-erosive gastritis (inflammation) was found in the gastric antrum and cardia.  Multiple biopsies were performed using cold forceps.   DUODENUM: The duodenal mucosa showed no abnormalities in the bulb and second portion of the duodenum.   Dilation was then performed at the proximal esophagus DUE TO [place]. Dilator: Savary over guidewire Size(s): 16-17 MM Resistance: moderate Heme: none COMPLICATIONS: There were no immediate complications.  ENDOSCOPIC IMPRESSION: 1.   DYSPHAGIS DUE TO ESOPHAGEAL WEB AND/OR UNCONTROLLED GERD. 2.   MILD Non-erosive gastritis in the gastric antrum and cardia; multiple biopsies  RECOMMENDATIONS: FOLLOW A HIGH FIBER DIET.  AVOID ITEMS THAT CAUSE  BLOATING. CONTINUE PROTONIX.  TAKE 30 MINUTES PRIOR TO BREAKFAST. AWAIT BIOPSY RESULTS . Follow up in MAY 2017. Next colonoscopy in 3-5 years.     _______________________________ eSignedDanie Binder, MD 14-Aug-2015 3:16 PM   CPT CODES: ICD CODES:  The ICD and CPT codes recommended by this software are interpretations from the data that the clinical staff has captured with the software.  The verification of the translation of this report to the ICD and CPT codes and modifiers is the sole responsibility of the health care institution and practicing physician where this report was generated.  [employer] will not be held responsible for the validity of the ICD and CPT codes included on this report.  AMA assumes no liability for data contained or not contained herein. CPT is a Designer, television/film set of the [employer].

## 2015-07-22 NOTE — Op Note (Signed)
[facility] [address] [place], [address]   COLONOSCOPY PROCEDURE REPORT  PATIENT: Sandra, [name]  MR#: [id number] BIRTHDATE: [birth year] , 63  yrs. old GENDER: female ENDOSCOPIST: Danie Binder, MD REFERRED RI:[id number] Karie Kirks, M.D. PROCEDURE DATE:  07/23/15 PROCEDURE:   Colonoscopy with cold biopsy polypectomy INDICATIONS:hematochezia. MEDICATIONS: Demerol 75 mg IV and Versed 5 mg IV  MD INITIATED SEDATION: 1247. ENDOSCOPY COMPLETE: 1335  DESCRIPTION OF PROCEDURE:    Physical exam was performed.  Informed consent was obtained from the patient after explaining the benefits, risks, and alternatives to procedure.  The patient was connected to monitor and placed in left lateral position. Continuous oxygen was provided by nasal cannula and IV medicine administered through an indwelling cannula.  After administration of sedation and rectal exam, the patients rectum was intubated and the [id number]  colonoscope was advanced under direct visualization to the ileum.  The scope was removed slowly by carefully examining the color, texture, anatomy, and integrity mucosa on the way out.  The patient was recovered in endoscopy and discharged home in satisfactory condition. Estimated blood loss is zero unless otherwise noted in this procedure report.    COLON FINDINGS: Three sessile polyps ranging from 2 to 63mm in size were found in the distal transverse colon.  A polypectomy was performed with cold forceps.  , The colon was redundant.  Manual abdominal counter-pressure was used to reach the cecum, and Moderate sized external and internal hemorrhoids were found.  PREP QUALITY: excellent.  CECAL W/D TIME: 13       minutes COMPLICATIONS: None  ENDOSCOPIC IMPRESSION: 1.   Three colon polyps REMOVED 2.   The LEFT colon was redundant 3.   RECTAL BLEEDING DUE TO INTERNAL HEMORRHOIDS  RECOMMENDATIONS: FOLLOW A HIGH FIBER DIET.  AVOID ITEMS THAT CAUSE  BLOATING. CONTINUE PROTONIX.  TAKE 30 MINUTES PRIOR TO BREAKFAST. AWAIT BIOPSY RESULTS . Follow up in MAY 2017. Next colonoscopy in 3-5 years.    _______________________________ eSignedDanie Binder, MD Jul 23, 2015 3:22 PM   CPT CODES: ICD CODES:  The ICD and CPT codes recommended by this software are interpretations from the data that the clinical staff has captured with the software.  The verification of the translation of this report to the ICD and CPT codes and modifiers is the sole responsibility of the health care institution and practicing physician where this report was generated.  [employer] will not be held responsible for the validity of the ICD and CPT codes included on this report.  AMA assumes no liability for data contained or not contained herein. CPT is a Designer, television/film set of the [employer].

## 2015-07-25 ENCOUNTER — Encounter (HOSPITAL_COMMUNITY): Payer: Self-pay | Admitting: Gastroenterology

## 2015-08-07 ENCOUNTER — Telehealth: Payer: Self-pay | Admitting: Gastroenterology

## 2015-08-07 NOTE — Telephone Encounter (Signed)
Pt called to say that the pantoprazole is helping her acid reflux, but it's making her sick. She is also wanting to know her results from her procedure. Please advise and call her at [phone]

## 2015-08-08 ENCOUNTER — Telehealth: Payer: Self-pay | Admitting: Gastroenterology

## 2015-08-08 MED ORDER — OMEPRAZOLE 20 MG PO CPDR: DELAYED_RELEASE_CAPSULE | ORAL | Status: DC

## 2015-08-08 NOTE — Telephone Encounter (Signed)
Pt is aware.  

## 2015-08-08 NOTE — Telephone Encounter (Signed)
SEE TC FEB 2.

## 2015-08-08 NOTE — Telephone Encounter (Signed)
Reminder in epic °

## 2015-08-08 NOTE — Telephone Encounter (Signed)
Please call pt. She had simple adenomas removed from her colon. HER stomach Bx shows CHANGES FROM ACID REFLUX AND gastritis.   IF PROTONIX MAKES HER FEEL SICK, THEN SHE SHOULD STOP IT AND CHANGE TO OMEPRAZOLE 30 MINS PRIOR TO MEALS TWICE DAILY.  CONTINUE YOUR WEIGHT LOSS EFFORTS.  FOLLOW A HIGH FIBER/LOW FAT DIET. AVOID ITEMS THAT CAUSE BLOATING.   AVOID FOOD AND DRINKS THAT TRIGGER REFLUX.  Follow up in MAY 2017 E30 GERD/NAUSEA.  Next colonoscopy in 3 years.

## 2015-08-14 ENCOUNTER — Telehealth: Payer: Self-pay | Admitting: Gastroenterology

## 2015-08-14 NOTE — Telephone Encounter (Signed)
Pt called to say that she had procedure recently and the medicine hasn't helped her any and she feels worse. Please advise and call [phone]

## 2015-08-14 NOTE — Telephone Encounter (Signed)
I called pt and she said she is taking the Omeprazole 20 mg bid and it is not helping much. Said she gets sick on her stomach almost everytime she eats. I asked her what she is eating and she said fried chicken, hamburger and blue berries. I told she she should not be eating fried foods and go with a more bland diet and see if that helps. She wants to know if Dr. Oneida Alar will send her something in for nausea. She is aware that Dr. Oneida Alar has left for the day.

## 2015-08-15 MED ORDER — PROMETHAZINE HCL 12.5 MG PO TABS: ORAL_TABLET | ORAL | Status: DC

## 2015-08-15 NOTE — Telephone Encounter (Signed)
Pt left a VM today that she is still wanting something for nausea sent to the pharmacy. Said she will call back after 4:30 pm.

## 2015-08-15 NOTE — Telephone Encounter (Signed)
Pt is aware.  

## 2015-08-15 NOTE — Addendum Note (Signed)
Addended by: Danie Binder on: 08/15/2015 04:16 PM   Modules accepted: Orders

## 2015-08-15 NOTE — Telephone Encounter (Signed)
PLEASE CALL PT. RX FOR PHENERGAN SENT. CONTINUE OMEPRAZOLE BID.

## 2015-08-23 ENCOUNTER — Other Ambulatory Visit: Payer: Self-pay | Admitting: Gastroenterology

## 2015-10-15 ENCOUNTER — Ambulatory Visit (INDEPENDENT_AMBULATORY_CARE_PROVIDER_SITE_OTHER): Payer: Medicare Other | Admitting: Allergy and Immunology

## 2015-10-15 ENCOUNTER — Encounter: Payer: Self-pay | Admitting: Allergy and Immunology

## 2015-10-15 VITALS — BP 122/70 | HR 62 | Temp 98.3°F | Resp 16 | Ht 61.61 in | Wt 224.9 lb

## 2015-10-15 DIAGNOSIS — J309 Allergic rhinitis, unspecified: Secondary | ICD-10-CM | POA: Diagnosis not present

## 2015-10-15 DIAGNOSIS — R05 Cough: Secondary | ICD-10-CM | POA: Diagnosis not present

## 2015-10-15 DIAGNOSIS — H1010 Acute atopic conjunctivitis, unspecified eye: Secondary | ICD-10-CM

## 2015-10-15 DIAGNOSIS — R062 Wheezing: Secondary | ICD-10-CM | POA: Diagnosis not present

## 2015-10-15 DIAGNOSIS — R059 Cough, unspecified: Secondary | ICD-10-CM

## 2015-10-15 MED ORDER — BECLOMETHASONE DIPROPIONATE 80 MCG/ACT IN AERS: INHALATION_SPRAY | RESPIRATORY_TRACT | Status: DC

## 2015-10-15 NOTE — Progress Notes (Signed)
NEW PATIENT NOTE  RE: Sandra [name] MRN: [id number] DOB: 1951/02/17 [facility] [address] [place], [place] [address] Date of Office Visit: 10/15/2015  Dear Sandra Evens, MD:  I had the pleasure of seeing Sandra [name] today in initial evaluation as you recall-- Subjective:  Sandra [name] is a 65 y.o. female who presents today for Allergy Testing and Breathing Problem  Assessment:   1. History of Cough and wheeze, with significant in office spirometry reversibility consistent with persistent asthma.    2. Allergic rhinoconjunctivitis, seasonal and perennial hypersensitivities.    3. Complex medical history.    4.      Recent mouth infection, completing antibiotics, improved. Plan:   Meds ordered this encounter  Medications  . beclomethasone (QVAR) 80 MCG/ACT inhaler    Sig: Use 2 puffs twice daily to prevent cough or wheeze.  Rinse, gargle, and spit after use.    Dispense:  1 Inhaler    Refill:  5    Please hold until patient requests refill   Patient Instructions  1. Avoidance: Mite, Mold and Pollen 2. Antihistamine: Zyrtec 10mg  by mouth once daily for runny nose or itching. 3. Nasal Spray: Flonase 1-2 spray(s) each nostril once daily for stuffy nose or drainage.  4. Inhalers:  Rescue: Ventolin 2 puffs every 2 hours as needed for cough or wheeze.       -May use 2 puffs 10-20 minutes prior to exercise.  Preventative: Begin QVAR 62mcg 2 puffs daily (Rinse, gargle, and spit out after use). 5. Eye Drops: Zaditor one drop(s) each eye Twice daily for itchy eyes as needed. 6. Other: Complete Prednisone 20mg  once daily for 3 days. 7. Nasal Saline wash each evening at shower time. 8. Follow up Visit:  2 months or sooner if needed.  HPI: Sandra [name] presents to the office with a 15 year history of rhinorrhea, congestion, sneezing, itchy watery eyes and postnasal drip which is greater with pollen, dust, animal dander, outdoor, perfume/odor,  cigarette smoke and fluctuant weather pattern exposures.  Intermittently her symptoms have been associated with cough and wheeze which seems greater since October 2016 when she had difficulty with bronchitis.  She denies shortness of breath or difficulty in breathing but has had ED visits and uses albuterol a few times a week.  She is not sure about courses of systemic steroids but is completing an antibiotic for mouth infection, now day 4 of 10.  Recently she has added a nasal spray which is helpful, but denies any history of sinusitis, discolored drainage, headache, fever, sore throat or sputum production.  There has been activity/exercise induced or nocturnal symptoms and often recurring daily concerns.  She has no recollection of an asthma diagnosis and only recalls a chest x-ray in the ED, though no sinus x-ray or CT scan.  Medical History: Past Medical History  Diagnosis Date  . Diabetes mellitus 2012    borderline diabetes, never on medicine   . Acid reflux 2000    HA with nexium   . Enteritis 04/23/2011  . Hypercholesteremia 2011  . Hypertension 1990s   . Ileitis 03/24/2014  . Chest pain, unspecified 03/25/2014    MI ruled out. CTA negative PE  . Sleep apnea   . Arthritis   . Anemia   . Angio-edema     caused by Lisinopril   Surgical History: Past Surgical History  Procedure Laterality Date  . Abdominal hysterectomy  1983   . Incontinence surgery  1990   .  Tubal ligation  1983   . Cataract extraction w/phaco Right 01/01/2015    Procedure: CATARACT EXTRACTION PHACO AND INTRAOCULAR LENS PLACEMENT (IOC);  Surgeon: Rutherford Guys, MD;  Location: [facility];  Service: Ophthalmology;  Laterality: Right;  CDE:5.11  . Cataract extraction w/phaco Left 01/15/2015    Procedure: CATARACT EXTRACTION PHACO AND INTRAOCULAR LENS PLACEMENT (IOC);  Surgeon: Rutherford Guys, MD;  Location: [facility];  Service: Ophthalmology;  Laterality: Left;  CDE:4.11  . Colonoscopy N/A 07/22/2015    Procedure: COLONOSCOPY;   Surgeon: Danie Binder, MD;  Location: [facility];  Service: Endoscopy;  Laterality: N/A;  1245pm  . Esophagogastroduodenoscopy N/A 07/22/2015    Procedure: ESOPHAGOGASTRODUODENOSCOPY (EGD);  Surgeon: Danie Binder, MD;  Location: [facility];  Service: Endoscopy;  Laterality: N/A;  . Savory dilation N/A 07/22/2015    Procedure: SAVORY DILATION;  Surgeon: Danie Binder, MD;  Location: [facility];  Service: Endoscopy;  Laterality: N/A;  . Tonsillectomy     Family History: Family History  Problem Relation Age of Onset  . Diabetes Mother   . Heart disease Mother   . Stroke Mother   . Diabetes Maternal Aunt   . Cancer Maternal Aunt 60    COLON CANCER  . Diabetes Maternal Uncle   . Cancer Daughter 82    breast   . Allergic rhinitis Daughter   . Asthma Brother   . Eczema Brother   . Angioedema Neg Hx   . Urticaria Neg Hx   . Immunodeficiency Neg Hx   . Asthma Grandchild   . Allergic rhinitis Son   . Seizures Son   . Diabetes Sister   . Hypertension Sister   . Kidney disease Sister    Social History: Social History  . Marital Status: Divorced    Spouse Name: N/A  . Number of Children: 3   . Years of Education: some college   Occupational History  . Retired      Former Quarry manager    Social History Main Topics  . Smoking status: Never Smoker   . Smokeless tobacco: Never Used  . Alcohol Use: No  . Drug Use: No  . Sexual Activity: Not Currently    Birth Control/ Protection: Surgical   Social History Narrative   3 children-2 living, sons. Lives alone. 7 grandchildren. 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    Chanetta has a current medication list which includes the following prescription(s): acetaminophen, albuterol, albuterol neb, aspirin ec, atorvastatin, calcium-vitamin d, cefuroxime, fluticasone, hydrochlorothiazide, metoprolol tartrate, omeprazole.  Drug Allergies: Allergies  Allergen Reactions  . Latex Itching  . Lisinopril Swelling     Tingling, swelling lips   . Penicillins Itching  . Protonix [Pantoprazole Sodium]     NAUSEA  . Sulfa Antibiotics     Stomach irritation    Environmental History: [place] lives in a >44 year old house for 12 years with tile/carpeted floors, with central heat and air; stuffed mattress, non-feather pillow/comforter without humidifier, pets, or smokers.   Review of Systems  Constitutional: Negative for fever, weight loss and malaise/fatigue.       Childhood varicella disease.  HENT: Positive for congestion. Negative for ear pain, hearing loss, nosebleeds and sore throat.   Eyes: Negative for blurred vision, discharge and redness.       History of cataracts and reading glasses.  Respiratory: Positive for cough (see HPI.). Negative for shortness of breath.        Denies history  of pneumonia.  Gastrointestinal: Negative for heartburn, nausea, vomiting, abdominal pain, diarrhea and constipation.  Genitourinary: Negative.   Musculoskeletal: Negative for myalgias and joint pain.  Skin: Negative.  Negative for itching and rash.  Neurological: Negative.  Negative for dizziness, tingling, seizures, weakness and headaches.  Endo/Heme/Allergies: Positive for environmental allergies.       Denies sensitivity to aspirin, NSAIDs, stinging insects, foods, latex, jewelry and cosmetics.  Immunological: No chronic or recurring infections. Objective:   Filed Vitals:   10/15/15 1445  BP: 122/70  Pulse: 62  Temp: 98.3 F (36.8 C)  Resp: 16   SpO2 Readings from Last 1 Encounters:  10/15/15 97%   Physical Exam  Constitutional: She is well-developed, well-nourished, and in no distress.  HENT:  Head: Atraumatic.  Right Ear: Tympanic membrane and ear canal normal.  Left Ear: Tympanic membrane and ear canal normal.  Nose: Mucosal edema present. No rhinorrhea. No epistaxis.  Mouth/Throat: Oropharynx is clear and moist and mucous membranes are normal. No oropharyngeal exudate, posterior oropharyngeal  edema or posterior oropharyngeal erythema.  Eyes: Conjunctivae are normal.  Neck: Neck supple.  Cardiovascular: Normal rate, S1 normal and S2 normal.   No murmur heard. Pulmonary/Chest: Effort normal. No accessory muscle usage. No respiratory distress. She has no wheezes. She has no rhonchi. She has no rales.  Post Xopenex/Atrovent neb: Continues to be clear to auscultation without adventitious breath sounds, patient reports improved aeration.  Abdominal: Soft. Normal appearance and bowel sounds are normal.  Musculoskeletal: She exhibits no edema.  Lymphadenopathy:    She has no cervical adenopathy.  Neurological: She is alert.  Skin: Skin is warm and intact. No rash noted. No cyanosis. Nails show no clubbing.   Diagnostics: Spirometry:  FVC  1.52--75%, FEV1 1.10--65%, FEF 25-75% 0.82--- 31%; Significant post bronchodilator improvement.  FVC 1.89--- 94%, FEV1 1.52--90%, FEF 25-75% 1.35--- 50%.    Skin testing: Mild reactivity to mold mix, weed and tree pollen mix and minimal reactivity to dust mite mix.     Bishop Vanderwerf M. Ishmael Holter, MD   cc: Robert Bellow, MD

## 2015-10-15 NOTE — Patient Instructions (Signed)
Take Home Sheet  1. Avoidance: Mite, Mold and Pollen   2. Antihistamine: Zyrtec 10mg  by mouth once daily for runny nose or itching.   3. Nasal Spray: Flonase 1-2 spray(s) each nostril once daily for stuffy nose or drainage.    4. Inhalers:  Rescue: Ventolin 2 puffs every 2 hours as needed for cough or wheeze.       -May use 2 puffs 10-20 minutes prior to exercise.   Preventative: QVAR 38mcg 2 puffs daily (Rinse, gargle, and spit out after use).   5. Eye Drops: Zaditor one drop(s) each eye Twice daily for itchy eyes as needed.   6. Other: Prednisone 20mg  once daily for 3 days.   7. Nasal Saline wash each evening at shower time.   8. Follow up Visit:  2 months or sooner if needed.   Websites that have reliable Patient information: 1. American Academy of Asthma, Allergy, & Immunology: www.aaaai.org 2. Food Allergy Network: www.foodallergy.org 3. Mothers of Asthmatics: www.aanma.org 4. [place]: DiningCalendar.de 5. American College of Allergy, Asthma, & Immunology: https://robertson.info/ or www.acaai.org

## 2015-10-21 ENCOUNTER — Encounter: Payer: Self-pay | Admitting: Gastroenterology

## 2015-10-23 ENCOUNTER — Encounter: Payer: Self-pay | Admitting: Allergy and Immunology

## 2015-10-31 ENCOUNTER — Encounter: Payer: Self-pay | Admitting: Internal Medicine

## 2015-10-31 ENCOUNTER — Other Ambulatory Visit (INDEPENDENT_AMBULATORY_CARE_PROVIDER_SITE_OTHER): Payer: Medicare Other

## 2015-10-31 ENCOUNTER — Ambulatory Visit (INDEPENDENT_AMBULATORY_CARE_PROVIDER_SITE_OTHER)
Admission: RE | Admit: 2015-10-31 | Discharge: 2015-10-31 | Disposition: A | Discharge status: Home / Self Care | Payer: Medicare Other | Source: Ambulatory Visit | Attending: Internal Medicine | Admitting: Internal Medicine

## 2015-10-31 ENCOUNTER — Ambulatory Visit (INDEPENDENT_AMBULATORY_CARE_PROVIDER_SITE_OTHER): Payer: Medicare Other | Admitting: Internal Medicine

## 2015-10-31 VITALS — BP 148/78 | HR 72 | Ht 63.0 in | Wt 224.8 lb

## 2015-10-31 DIAGNOSIS — R0600 Dyspnea, unspecified: Secondary | ICD-10-CM | POA: Diagnosis not present

## 2015-10-31 DIAGNOSIS — J4540 Moderate persistent asthma, uncomplicated: Secondary | ICD-10-CM

## 2015-10-31 LAB — CBC WITH DIFFERENTIAL/PLATELET
Basophils Absolute: 0 10*3/uL (ref 0.0–0.1)
Basophils Relative: 0.4 % (ref 0.0–3.0)
EOS PCT: 1.3 % (ref 0.0–5.0)
Eosinophils Absolute: 0.1 10*3/uL (ref 0.0–0.7)
HEMATOCRIT: 34.1 % — AB (ref 36.0–46.0)
HEMOGLOBIN: 11.4 g/dL — AB (ref 12.0–15.0)
LYMPHS PCT: 22.3 % (ref 12.0–46.0)
Lymphs Abs: 1.8 10*3/uL (ref 0.7–4.0)
MCHC: 33.5 g/dL (ref 30.0–36.0)
MCV: 80.8 fl (ref 78.0–100.0)
MONO ABS: 0.5 10*3/uL (ref 0.1–1.0)
MONOS PCT: 6.1 % (ref 3.0–12.0)
Neutro Abs: 5.6 10*3/uL (ref 1.4–7.7)
Neutrophils Relative %: 69.9 % (ref 43.0–77.0)
Platelets: 318 10*3/uL (ref 150.0–400.0)
RBC: 4.22 Mil/uL (ref 3.87–5.11)
RDW: 15.8 % — ABNORMAL HIGH (ref 11.5–15.5)
WBC: 8 10*3/uL (ref 4.0–10.5)

## 2015-10-31 LAB — BASIC METABOLIC PANEL
BUN: 20 mg/dL (ref 6–23)
CO2: 33 mEq/L — ABNORMAL HIGH (ref 19–32)
CREATININE: 0.89 mg/dL (ref 0.40–1.20)
Calcium: 9.5 mg/dL (ref 8.4–10.5)
Chloride: 102 mEq/L (ref 96–112)
GFR: 81.63 mL/min (ref >60.00)
Glucose, Bld: 125 mg/dL — ABNORMAL HIGH (ref 70–99)
POTASSIUM: 3.7 meq/L (ref 3.5–5.1)
Sodium: 142 mEq/L (ref 135–145)

## 2015-10-31 LAB — BRAIN NATRIURETIC PEPTIDE: PRO B NATRI PEPTIDE: 32 pg/mL (ref 0.0–100.0)

## 2015-10-31 LAB — TSH: TSH: 1.51 u[IU]/mL (ref 0.35–4.50)

## 2015-10-31 LAB — NITRIC OXIDE: Nitric Oxide: 16

## 2015-10-31 NOTE — Patient Instructions (Signed)
Work on inhaler technique:  relax and gently blow all the way out then take a nice smooth deep breath back in, triggering the inhaler at same time you start breathing in.  Hold for up to 5 seconds if you can. Blow out thru nose. Rinse and gargle with water when done    Plan A = Automatic = Qvar 80 2 pffs each am   Plan B = Backup Only use your albuterol as a rescue medication to be used if you can't catch your breath by resting or doing a relaxed purse lip breathing pattern.  - The less you use it, the better it will work when you need it. - Ok to use the inhaler up to 2 puffs  every 4 hours if you must but call for appointment if use goes up over your usual need - Don't leave home without it !!  (think of it like the spare tire for your car)   Plan C = Crisis - only use your albuterol nebulizer if you first try Plan B and it fails to help > ok to use the nebulizer up to every 4 hours but if start needing it regularly call for immediate appointment  GERD (REFLUX)  is an extremely common cause of respiratory symptoms just like yours , many times with no obvious heartburn at all.    It can be treated with medication, but also with lifestyle changes including elevation of the head of your bed (ideally with 6 inch  bed blocks),  Smoking cessation, avoidance of late meals, excessive alcohol, and avoid fatty foods, chocolate, peppermint, colas, red wine, and acidic juices such as orange juice.  NO MINT OR MENTHOL PRODUCTS SO NO COUGH DROPS  USE SUGARLESS CANDY INSTEAD (Jolley ranchers or Stover's or Life Savers) or even ice chips will also do - the key is to swallow to prevent all throat clearing. NO OIL BASED VITAMINS - use powdered substitutes.  Please remember to go to the lab and x-ray department downstairs for your tests - we will call you with the results when they are available.    Keep all appointments with Sandra [name] and if she releases you from further follow up and you're not better to your  satisfaction please return here

## 2015-10-31 NOTE — Progress Notes (Signed)
Subjective:    Patient ID: Sandra [name], female    DOB: 04-Aug-1950     MRN: [id number]  HPI  82 yobf never smoker with new onset itching sneezing around 2007 triggered dust rx with avoidance then sob x 2015 and  fall 2016 onset severe sob/ cough> seen by Dr  Ishmael Holter dx asthma rx qvar > 50%  better on inhalers so referred to pulmonary clinic 10/31/2015 by Dr Oneida Alar in [place].  Dr Rhetta Mura eval 10/15/15: 15 year history of rhinorrhea, congestion, sneezing, itchy watery eyes and postnasal drip which is greater with pollen, dust, animal dander, outdoor, perfume/odor, cigarette smoke and fluctuant weather pattern exposures. Intermittently her symptoms have been associated with cough and wheeze which seems greater since October 2016 when she had difficulty with bronchitis. She denies shortness of breath or difficulty in breathing but has had ED visits and uses albuterol a few times a week. She is not sure about courses of systemic steroids but is completing an antibiotic for mouth infection, now day 4 of 10. Recently she has added a nasal spray which is helpful, but denies any history of sinusitis, discolored drainage, headache, fever, sore throat or sputum production. There has been activity/exercise induced or nocturnal symptoms and often recurring daily concerns. She has no recollection of an asthma diagnosis  Spirometry c/w fully reversible airflow obst Skin tests Skin testing: Mild reactivity to mold mix, weed and tree pollen mix and minimal reactivity to dust mite mix rec  1. Avoidance: Mite, Mold and Pollen 2. Antihistamine: Zyrtec 10mg  by mouth once daily for runny nose or itching. 3. Nasal Spray: Flonase 1-2 spray(s) each nostril once daily for stuffy nose or drainage.  4. Inhalers:  Rescue: Ventolin 2 puffs every 2 hours as needed for cough or wheeze.  -May use 2 puffs 10-20 minutes prior to exercise.  Preventative: QVAR 67mcg 2 puffs daily (Rinse, gargle, and spit out  after use). 5. Eye Drops: Zaditor one drop(s) each eye Twice daily for itchy eyes as needed. 6. Other: Prednisone 20mg  once daily for 3 days. 7. Nasal Saline wash each evening at shower time.   10/31/2015 1st [facility] office visit/ Duaa Stelzner   Chief Complaint  Patient presents with  . Pulmonary Consult    Referred by Dr. Barney Drain. Pt c/o SOB for the past 2 yrs, worse since Oct 2016. She gets SOB when walking up steps. She notices this is worse when exposed to dust or pollen. She also c/o non prod- mainly occurs when she is having SOB.   on qvar 2 puffs in am / and alb 2x weekly and neb once a month though hfa poor  Can't sleep on back x years due to immediate cough/ sob / does ok prone/ still sob with exertion with or without saba/  with no change in weight since last able to sleep flat and walk s sob but cough clearly correlates with flares of sob   No obvious other patterns in day to day or daytime variabilty or assoc cp or chest tightness,   overt sinus symptoms. No unusual exp hx or h/o childhood pna/ asthma or knowledge of premature birth.  Sleeping ok prone without nocturnal  or early am exacerbation  of respiratory  c/o's or need for noct saba. Also denies any obvious fluctuation of symptoms with weather or environmental changes or other aggravating or alleviating factors except as outlined above   Current Medications, Allergies, Complete Past Medical History, Past Surgical History, Family History,  and Social History were reviewed in [employer] record.                  Review of Systems  Constitutional: Negative for fever, chills and unexpected weight change.  HENT: Positive for trouble swallowing and voice change. Negative for congestion, dental problem, ear pain, nosebleeds, postnasal drip, rhinorrhea, sinus pressure, sneezing and sore throat.   Eyes: Negative for visual disturbance.  Respiratory: Positive for cough and shortness of breath.  Negative for choking.   Cardiovascular: Negative for chest pain and leg swelling.  Gastrointestinal: Negative for vomiting, abdominal pain and diarrhea.  Genitourinary: Negative for difficulty urinating.       Acid heartburn Indigestion  Musculoskeletal: Negative for arthralgias.  Skin: Negative for rash.  Neurological: Positive for headaches. Negative for tremors and syncope.  Hematological: Does not bruise/bleed easily.       Objective:   Physical Exam  Obese amb wf nad  Wt Readings from Last 3 Encounters:  10/31/15 224 lb 12.8 oz (101.969 kg)  10/15/15 224 lb 13.9 oz (102 kg)  07/18/15 224 lb (101.606 kg)    Vital signs reviewed   HEENT: nl dentition, turbinates, and oropharynx. Nl external ear canals without cough reflex   NECK :  without JVD/Nodes/TM/ nl carotid upstrokes bilaterally   LUNGS: no acc muscle use,  Nl contour chest which is clear to A and P bilaterally without cough on insp or exp maneuvers   CV:  RRR  no s3 or murmur or increase in P2, no edema   ABD:  soft and nontender with nl inspiratory excursion in the supine position. No bruits or organomegaly, bowel sounds nl  MS:  Nl gait/ ext warm without deformities, calf tenderness, cyanosis or clubbing No obvious joint restrictions   SKIN: warm and dry without lesions    NEURO:  alert, approp, nl sensorium with  no motor deficits     CXR PA and Lateral:   10/31/2015 :    I personally reviewed images and agree with radiology impression as follows:   Mediastinum hilar structures normal. Heart size normal. Minimal right middle lobe infiltrate cannot be excluded. No pleural effusion or pneumothorax. Diffuse osteopenia degenerative change thoracic Spine My impression: no convincing atx or as dz rml     Labs ordered/ reviewed:      Chemistry      Component Value Date/Time   NA 142 10/31/2015 0943   K 3.7 10/31/2015 0943   CL 102 10/31/2015 0943   CO2 33* 10/31/2015 0943   BUN 20 10/31/2015  0943   CREATININE 0.89 10/31/2015 0943      Component Value Date/Time   CALCIUM 9.5 10/31/2015 0943   ALKPHOS 117 05/16/2015 1256   AST 28 05/16/2015 1256   ALT 14 05/16/2015 1256   BILITOT 1.0 05/16/2015 1256        Lab Results  Component Value Date   WBC 8.0 10/31/2015   HGB 11.4* 10/31/2015   HCT 34.1* 10/31/2015   MCV 80.8 10/31/2015   PLT 318.0 10/31/2015        Lab Results  Component Value Date   TSH 1.51 10/31/2015     Lab Results  Component Value Date   PROBNP 32.0 10/31/2015                   Assessment & Plan:

## 2015-11-01 ENCOUNTER — Encounter: Payer: Self-pay | Admitting: Internal Medicine

## 2015-11-01 DIAGNOSIS — J4540 Moderate persistent asthma, uncomplicated: Secondary | ICD-10-CM

## 2015-11-01 HISTORY — DX: Moderate persistent asthma, uncomplicated: J45.40

## 2015-11-01 LAB — RESPIRATORY ALLERGY PROFILE REGION II ~~LOC~~
Allergen, Comm Silver Birch, t9: <0.1 kU/L
Allergen, Cottonwood, t14: 0.13 kU/L — ABNORMAL HIGH
Allergen, D pternoyssinus,d7: <0.1 kU/L
Allergen, Mulberry, t76: <0.1 kU/L
Allergen, Oak,t7: <0.1 kU/L
Bermuda Grass: <0.1 kU/L
Box Elder IgE: <0.1 kU/L
Cat Dander: <0.1 kU/L
Cladosporium Herbarum: <0.1 kU/L
Cockroach: <0.1 kU/L
Dog Dander: <0.1 kU/L
Elm IgE: <0.1 kU/L
IgE (Immunoglobulin E), Serum: 89 kU/L (ref <115)
Sheep Sorrel IgE: <0.1 kU/L

## 2015-11-01 NOTE — Assessment & Plan Note (Addendum)
Spirometry 10/15/15   FEV1 1.10 (65%)  > 1.52 (90%)  Ratio 80 p saba = 38% response - NO 10/31/2015   = 16     Symptoms are  disproportionate to objective findings and not clear this is all a  lung problem but pt does appear to have difficult airway management issues. DDX of  difficult airways management almost all start with A and  include Adherence, Ace Inhibitors, Acid Reflux, Active Sinus Disease, Alpha 1 Antitripsin deficiency, Anxiety masquerading as Airways dz,  ABPA,  Allergy(esp in young), Aspiration (esp in elderly), Adverse effects of meds,  Active smokers, A bunch of PE's (a small clot burden can't cause this syndrome unless there is already severe underlying pulm or vascular dz with poor reserve) plus two Bs  = Bronchiectasis and Beta blocker use..and one C= CHF    Adherence is always the initial "prime suspect" and is a multilayered concern that requires a "trust but verify" approach in every patient - starting with knowing how to use medications, especially inhalers, correctly, keeping up with refills and understanding the fundamental difference between maintenance and prns vs those medications only taken for a very short course and then stopped and not refilled.  - The proper method of use, as well as anticipated side effects, of a metered-dose inhaler are discussed and demonstrated to the patient. Improved effectiveness after extensive coaching during this visit to a level of approximately 75 % from a baseline of 50 % > continue qvar 80 2 each am for now as appears to be controlling the inflammatory component but low threshold to move to laba/ics if symptoms remain poorly controlled on qvar and saba > defer to DR Ishmael Holter (explained there is not reason to see both of [place] for asthma and we could see her back here prn)  ? Acid (or non-acid) GERD > always difficult to exclude as up to 75% of pts in some series report no assoc GI/ Heartburn symptoms> rec max (24h)  acid suppression and diet  restrictions/ reviewed and instructions given in writing.  ? Allergy > NO so low suggests this component is well addressed with qvar 80 >  Defer to rx  Dr Ishmael Holter    ? Anxiety > dx of exclusion   ? BB effect > ideally prefer more selective BB like bisoprolol but she is only on low dose metaprolol and no need to change unless higher doses required   ? chf > excluded with bnp so low  I had an extended discussion with the patient reviewing all relevant studies completed to date and  lasting 35 minutes of a 60  Minute initial  visit    Each maintenance medication was reviewed in detail including most importantly the difference between maintenance and prns and under what circumstances the prns are to be triggered using an action plan format that is not reflected in the computer generated alphabetically organized AVS.    Please see instructions for details which were reviewed in writing and the patient given a copy highlighting the part that I personally wrote and discussed at today's ov.

## 2015-11-01 NOTE — Progress Notes (Signed)
Quick Note:  Spoke with pt and notified of results per Dr. Wert. Pt verbalized understanding and denied any questions.  ______ 

## 2015-11-01 NOTE — Assessment & Plan Note (Signed)
Complicated by HBP/ Hyperlipidemia  Body mass index is 39.83    Lab Results  Component Value Date   TSH 1.51 10/31/2015     Contributing to gerd tendency/ doe/reviewed the need and the process to achieve and maintain neg calorie balance > defer f/u primary care including intermittently monitoring thyroid status

## 2015-11-01 NOTE — Assessment & Plan Note (Signed)
No evidence of chf/ thyroid dz  There is minimal anemia but most likely her sob is combination of asthma and obesity - (see separate a/p)

## 2015-11-14 ENCOUNTER — Ambulatory Visit: Payer: Medicare Other | Admitting: Gastroenterology

## 2015-11-28 ENCOUNTER — Encounter: Payer: Self-pay | Admitting: Gastroenterology

## 2015-11-28 ENCOUNTER — Ambulatory Visit (INDEPENDENT_AMBULATORY_CARE_PROVIDER_SITE_OTHER): Payer: Medicare Other | Admitting: Gastroenterology

## 2015-11-28 VITALS — BP 143/73 | HR 54 | Temp 98.2°F | Ht 63.0 in | Wt 228.0 lb

## 2015-11-28 DIAGNOSIS — R1310 Dysphagia, unspecified: Secondary | ICD-10-CM

## 2015-11-28 DIAGNOSIS — K625 Hemorrhage of anus and rectum: Secondary | ICD-10-CM

## 2015-11-28 DIAGNOSIS — K219 Gastro-esophageal reflux disease without esophagitis: Secondary | ICD-10-CM | POA: Diagnosis not present

## 2015-11-28 MED ORDER — OMEPRAZOLE 20 MG PO CPDR: DELAYED_RELEASE_CAPSULE | ORAL | Status: DC

## 2015-11-28 NOTE — Assessment & Plan Note (Signed)
SYMPTOMS FAIRLY WELL CONTROLLED ON BID OMEPRAZOLE AND PRN QHS DOSE.  REFER TO DR. GOSRANI FOR WEIGHT LOSS INSTRUCTION. DRINK WATER TO KEEP YOUR URINE LIGHT YELLOW.  FOLLOW A HIGH FIBER/LOW FAT DIET. MEATS SHOULD BE BAKED, BROILED, OR BOILED. Avoid fried foods.   CONTINUE OMEPRAZOLE.  TAKE 30 MINUTES PRIOR TO YOUR MEALS TWICE DAILY and at bedtime if needed to control heart burn. AVOID TRIGGERS FOR REFLUX.     FOLLOW UP IN 6 MOS.

## 2015-11-28 NOTE — Assessment & Plan Note (Signed)
SYMPTOMS NOT IDEALLY CONTROLLED. AND DUE TO HEMORRHOIDS.  DRINK WATER TO KEEP YOUR URINE LIGHT YELLOW.  FOLLOW A HIGH FIBER/LOW FAT DIET. MEATS SHOULD BE BAKED, BROILED, OR BOILED. Avoid fried foods.  SEE DR. Arnoldo Morale TO DISCUSS BENEFITS V. RISKS OF HEMORRHOID SURGERY.  FOLLOW UP IN 6 MOS.

## 2015-11-28 NOTE — Progress Notes (Signed)
cc'ed to pcp °

## 2015-11-28 NOTE — Progress Notes (Signed)
Subjective:    Patient ID: Sandra [name], female    DOB: 1951-06-11, 65 y.o.   MRN: [id number]  Robert Bellow, MD   HPI If constipated may feel swollen, sore, and see a little blood.  RECTAL DISCOMFORT/SYMPTOMS HAPPEN ONCE. MEDS HAVE HELPED. BMs: EVERY MORNING BUT SOMETIMES Sandra [name] GETS LOOSE AND HAS PROBLEM WITH LOOSE STOOLS. THIS ALSO CAUSES BLOOD IN STOOL AND RECTAL PRESSURE/THROBBING. RARE NAUSEA. MAY SEE BLACK FORMED STOOL. SOB: CONTROLLED RIGHT NOW.   PT DENIES FEVER, CHILLS, HEMATEMESIS, nausea, vomiting, melena, CHEST PAIN, SHORTNESS OF BREATH, CHANGE IN BOWEL IN HABITS, constipation, abdominal pain, problems swallowing, problems with sedation, OR  heartburn or indigestion.  Past Medical History  Diagnosis Date  . Diabetes mellitus 2012    borderline diabetes, never on medicine   . Acid reflux 2000    HA with nexium   . Enteritis 04/23/2011  . Hypercholesteremia 2011  . Hypertension 1990s   . Ileitis 03/24/2014  . Chest pain, unspecified 03/25/2014    MI ruled out. CTA negative PE  . Sleep apnea   . Arthritis   . Anemia   . Angio-edema     caused by Lisinopril  . Moderate persistent asthma in adult without complication [id number]    Past Surgical History  Procedure Laterality Date  . Abdominal hysterectomy  1983   . Incontinence surgery  1990   . Tubal ligation  1983   . Cataract extraction w/phaco Right 01/01/2015    Procedure: CATARACT EXTRACTION PHACO AND INTRAOCULAR LENS PLACEMENT (IOC);  Surgeon: Rutherford Guys, MD;  Location: [facility];  Service: Ophthalmology;  Laterality: Right;  CDE:5.11  . Cataract extraction w/phaco Left 01/15/2015    Procedure: CATARACT EXTRACTION PHACO AND INTRAOCULAR LENS PLACEMENT (IOC);  Surgeon: Rutherford Guys, MD;  Location: [facility];  Service: Ophthalmology;  Laterality: Left;  CDE:4.11  . Colonoscopy N/A 07/22/2015    Procedure: COLONOSCOPY;  Surgeon: Danie Binder, MD;  Location: [facility];  Service: Endoscopy;  Laterality: N/A;   1245pm  . Esophagogastroduodenoscopy N/A 07/22/2015    Procedure: ESOPHAGOGASTRODUODENOSCOPY (EGD);  Surgeon: Danie Binder, MD;  Location: [facility];  Service: Endoscopy;  Laterality: N/A;  . Savory dilation N/A 07/22/2015    Procedure: SAVORY DILATION;  Surgeon: Danie Binder, MD;  Location: [facility];  Service: Endoscopy;  Laterality: N/A;  . Tonsillectomy      Allergies  Allergen Reactions  . Latex Itching  . Lisinopril Swelling    Tingling, swelling lips   . Penicillins Itching    Has patient had a PCN reaction causing immediate rash, facial/tongue/throat swelling, SOB or lightheadedness with hypotension: No no Has patient had a PCN reaction causing severe rash involving mucus membranes or skin necrosis: No no Has patient had a PCN reaction that required hospitalization No no Has patient had a PCN reaction occurring within the last 10 years: No no If all of the above answers are "NO", then may proceed with Cephalospo  . Protonix [Pantoprazole Sodium]     NAUSEA  . Sulfa Antibiotics     Stomach irritation    Current Outpatient Prescriptions  Medication Sig Dispense Refill  . albuterol  inhaler Inhale 1-2 puffs into the lungs Q6H PRN for wheezing or shortness of breath.    . Albuterol 0.083% nebulizer solution Take 2.5 mg by nebulization Q6H PRN for wheezing or shortness of breath.    Marland Kitchen aspirin EC 81 MG tablet Take 81 mg by mouth daily.    Marland Kitchen  LIPITOR 10 MG tablet Take 1 tablet (10 mg total) by mouth daily.    Marland Kitchen QVAR 80 MCG/ACT inhaler Use 2 puffs BID to prevent cough or wheeze.  Rinse, gargle, and spit after use.    Darron Doom WITH D 500-200 MG tablet Take 1 tablet by mouth 2 (two) times daily.    . hydrochlorothiazide  25 MG tablet Take 1 tablet (25 mg total) by mouth daily.    Marland Kitchen LOPRESSOR 25 MG tablet Take 1 tablet (25 mg total) by mouth 2 (two) times daily.    Marland Kitchen omeprazole  20 MG capsule 1 PO 30 mins prior to breakfast and supper AND AT BEDTIME.     Review of  Systems PER HPI OTHERWISE ALL SYSTEMS ARE NEGATIVE.    Objective:   Physical Exam  Constitutional: Sandra [name] is oriented to person, place, and time. Sandra [name] appears well-developed and well-nourished. No distress.  HENT:  Head: Normocephalic and atraumatic.  Mouth/Throat: Oropharynx is clear and moist. No oropharyngeal exudate.  Eyes: Pupils are equal, round, and reactive to light. No scleral icterus.  Neck: Normal range of motion. Neck supple.  Cardiovascular: Normal rate, regular rhythm and normal heart sounds.   Pulmonary/Chest: Effort normal and breath sounds normal. No respiratory distress.  Abdominal: Soft. Bowel sounds are normal. Sandra [name] exhibits no distension. There is no tenderness.  Musculoskeletal: Sandra [name] exhibits no edema.  Lymphadenopathy:    Sandra [name] has no cervical adenopathy.  Neurological: Sandra [name] is alert and oriented to person, place, and time.  NO FOCAL DEFICITS  Psychiatric: Sandra [name] has a normal mood and affect.  Vitals reviewed.     Assessment & Plan:

## 2015-11-28 NOTE — Patient Instructions (Signed)
DRINK WATER TO KEEP YOUR URINE LIGHT YELLOW.  FOLLOW A HIGH FIBER/LOW FAT DIET. MEATS SHOULD BE BAKED, BROILED, OR BOILED. Avoid fried foods. SEE INFO BELOW on a low fat diet.  CONTINUE OMEPRAZOLE.  TAKE 30 MINUTES PRIOR TO YOUR MEALS TWICE DAILY and at bedtime if needed to control heart burn.  AVOID TRIGGERS FOR REFLUX. SEE INFO BELOW.  SEE DR. Arnoldo Morale TO DISCUSS BENEFITS V. RISKS OF HEMORRHOID SURGERY.  FOLLOW UP IN 6 MOS.     Lifestyle and home remedies TO CONTROL HEARTBURN/REFLUX.  You may eliminate or reduce the frequency of heartburn by making the following lifestyle changes:  . Control your weight. Being overweight is a major risk factor for heartburn and GERD. Excess pounds put pressure on your abdomen, pushing up your stomach and causing acid to back up into your esophagus.   . Eat smaller meals. 4 TO 6 MEALS A DAY. This reduces pressure on the lower esophageal sphincter, helping to prevent the valve from opening and acid from washing back into your esophagus.   Dolphus Jenny your belt. Clothes that fit tightly around your waist put pressure on your abdomen and the lower esophageal sphincter.   . Eliminate heartburn triggers. Everyone has specific triggers. Common triggers such as fatty or fried foods, spicy food, tomato sauce, carbonated beverages, alcohol, chocolate, mint, garlic, onion, caffeine and nicotine may make heartburn worse.   Marland Kitchen Avoid stooping or bending. Tying your shoes is OK. Bending over for longer periods to weed your garden isn't, especially soon after eating.   . Don't lie down OR BEND OVER after a meal. Wait at least three to four hours after eating before going to bed, and don't lie down right after eating.    Alternative medicine . Several home remedies exist for treating GERD, but they provide only temporary relief. They include drinking baking soda (sodium bicarbonate) added to water or drinking other fluids such as baking soda mixed with cream of tartar  and water.  . Although these liquids create temporary relief by neutralizing, washing away or buffering acids, eventually they aggravate the situation by adding gas and fluid to your stomach, increasing pressure and causing more acid reflux. Further, adding more sodium to your diet may increase your blood pressure and add stress to your heart, and excessive bicarbonate ingestion can alter the acid-base balance in your body.

## 2015-11-28 NOTE — Assessment & Plan Note (Signed)
MOTIVATED TO LOSE WIGHT.  REFER TO DR. Central

## 2015-11-28 NOTE — Assessment & Plan Note (Signed)
SYMPTOMS CONTROLLED/RESOLVED.  CONTINUE TO MONITOR SYMPTOMS. 

## 2015-11-28 NOTE — Progress Notes (Signed)
ON RECALL  °

## 2015-11-29 ENCOUNTER — Other Ambulatory Visit: Payer: Self-pay

## 2015-11-29 DIAGNOSIS — K649 Unspecified hemorrhoids: Secondary | ICD-10-CM

## 2015-12-17 ENCOUNTER — Ambulatory Visit: Payer: Medicare Other | Admitting: Allergy and Immunology

## 2015-12-24 ENCOUNTER — Ambulatory Visit: Payer: Medicare Other | Admitting: Allergy and Immunology

## 2016-02-11 ENCOUNTER — Ambulatory Visit: Payer: Medicare Other | Admitting: Allergy and Immunology

## 2016-02-18 ENCOUNTER — Ambulatory Visit: Payer: Medicare Other | Admitting: Allergy & Immunology

## 2016-02-28 NOTE — H&P (Signed)
  NTS SOAP Note  Vital Signs:  Vitals as of: [id number]: Systolic [id number]: Diastolic 95: Heart Rate 75: Temp 80F (Temporal): Height 69ft 3in: Weight 229Lbs 0 Ounces: BMI 40.57   BMI : 40.57 kg/m2  Subjective: This 65 year old female presents for of hemorrhoidal disease.  Has been having blood on toilet paper when she wipes herself twice a month for some time now.  States it is made worse with constipation.  Supposed to take colace, but does not take it regularly.  When she gets constipated, she has to strain to move bowels.  No clots of blood in the toilet bowel.  Hurts to wipe herself.  Has had a TCS in the past.  No family h/o colon cancer.  Currently is not bleeding.  Symptoms resolved within 24-48 hours.  Does not use suppositories at present time.  Review of Symptoms:  Constitutional:negative headache Eyes:blurred vision bilateral sinus problems Cardiovascular:negative Respiratory:dyspnea, wheezing, cough Gastrointestinnausea, heartburn, dyspepsia Genitourinary:frequency joint and back pain boils Hematolgic/Lymphatic:negative Allergic/Immunologic:negative   Past Medical History:Reviewed  Past Medical History  Surgical History: TAH, incontinence surgery Medical Problems: morbid obesity, HTN, high cholesterol, asthma, sleep apnea Allergies: lastex, lisinopril, protonix, sulfa, pcn Medications: HCTZ, metoprolol, omeprazole, atorvastatin, ventolin, qvar   Social History:Reviewed  Social History  Preferred Language: English Race:  Black or African American Ethnicity: Not Hispanic / Latino Age: 70 year Marital Status:  S Alcohol: no   Smoking Status: Never smoker reviewed on 12/24/2015 Functional Status reviewed on 12/24/2015 ------------------------------------------------ Bathing: Normal Cooking: Normal Dressing: Normal Driving: Normal Eating: Normal Managing Meds: Normal Oral Care: Normal Shopping: Normal Toileting: Normal Transferring: Normal Walking:  Normal Cognitive Status reviewed on 12/24/2015 ------------------------------------------------ Attention: Normal Decision Making: Normal Language: Normal Memory: Normal Motor: Normal Perception: Normal Problem Solving: Normal Visual and Spatial: Normal   Family History:Reviewed  Family Health History Family History is Unknown    Objective Information: Morbidly obese bf in NAD Skin:no rash or prominent lesions Head:Atraumatic; no masses; no abnormalities Neck:Supple without lymphadenopathy.  Heart:RRR, no murmur or gallop.  Normal S1, S2.  No S3, S4.  Lungs:CTA bilaterally, no wheezes, rhonchi, rales.  Breathing unlabored. Abdomen:Soft, NT/ND, no HSM, no masses.   Normal sphincter tone, exam limited secondary to patient discomfort.  External hemorrhoid with mucusal prolpase noted along right aspect of anus, 7 o'clock position.  No active bleeding noted.  No fissure noted.  Assessment:External hemorrhoid with mucusal prolapse, not actively bleeding  Diagnoses: 455.3  K64.4 External hemorrhoids, simple (Residual hemorrhoidal skin tags)  Procedures: [id number] - OFFICE OUTPATIENT NEW 30 MINUTES    Plan:  Scheduled for extensive hemorrhoidectomy on 03/16/2016. The risks and benefits of the procedure including bleeding, infection, and recurrence of hemorrhoidal disease were fully explained to the patient, who gave informed consent.  Patient Education:Alternative treatments to surgery were discussed with patient (and family).

## 2016-03-06 NOTE — Patient Instructions (Signed)
Sandra [name]  03/06/2016     @PREFPERIOPPHARMACY @   Your procedure is scheduled on 03/16/2016.  Report to [facility] at 6:15 A.M.  Call this number if you have problems the morning of surgery:  [phone]   Remember:  Do not eat food or drink liquids after midnight.  Take these medicines the morning of surgery with A SIP OF WATER : Lopressor and Prilosec.  Please use your inhalers before leaving home and bring it with you to the hospital.    Do not wear jewelry, make-up or nail polish.  Do not wear lotions, powders, or perfumes, or deoderant.  Do not shave 48 hours prior to surgery.  Men may shave face and neck.  Do not bring valuables to the hospital.  [facility] is not responsible for any belongings or valuables.  Contacts, dentures or bridgework may not be worn into surgery.  Leave your suitcase in the car.  After surgery it may be brought to your room.  For patients admitted to the hospital, discharge time will be determined by your treatment team.  Patients discharged the day of surgery will not be allowed to drive home.   Name and phone number of your driver:   family Special instructions:  n/a  Please read over the following fact sheets that you were given. Care and Recovery After Surgery    Surgical Procedures for Hemorrhoids Surgical procedures can be used to treat hemorrhoids. Hemorrhoids are swollen veins that are inside the rectum (internal hemorrhoids) or around the anus (external hemorrhoids). They are caused by increased pressure in the anal area. This pressure may result from straining to have a bowel movement (constipation), diarrhea, pregnancy, obesity, anal sex, or sitting for long periods of time. Hemorrhoids can cause symptoms such as pain and bleeding. Surgery may be needed if diet changes, lifestyle changes, and other treatments do not help your symptoms. Various surgical methods may be used. Three common methods are:  Closed hemorrhoidectomy.  The hemorrhoids are surgically removed, and the surgical cuts (incisions) are closed with stitches (sutures).  Open hemorrhoidectomy. The hemorrhoids are surgically removed, but the incisions are allowed to heal without sutures.  Stapled hemorrhoidopexy. The hemorrhoids are removed using a device that takes out a ring of excess tissue. LET [facility] CARE PROVIDER KNOW ABOUT:  Any allergies you have.  All medicines you are taking, including vitamins, herbs, eye drops, creams, and over-the-counter medicines.  Previous problems you or members of your family have had with the use of anesthetics.  Any blood disorders you have.  Previous surgeries you have had.  Any medical conditions you have.  Whether you are pregnant or may be pregnant. RISKS AND COMPLICATIONS Generally, this is a safe procedure. However, problems may occur, including:  Infection.  Bleeding.  Allergic reactions to medicines.  Damage to other structures or organs.  Pain.  Constipation.  Difficulty passing urine.  Narrowing of the anal canal (stenosis).  Difficulty controlling bowel movements (incontinence). BEFORE THE PROCEDURE  Ask your health care provider about:  Changing or stopping your regular medicines. This is especially important if you are taking diabetes medicines or blood thinners.  Taking medicines such as aspirin and ibuprofen. These medicines can thin your blood. Do not take these medicines before your procedure if your health care provider instructs you not to.  You may need to have a procedure to examine the inside of your colon with a scope (colonoscopy). Your health care provider may do this to  make sure that there are no other causes for your bleeding or pain.  Follow instructions from your health care provider about eating or drinking restrictions.  You may be instructed to take a laxative and an enema to clean out your colon before surgery (bowel prep). Carefully follow  instructions from your health care provider about bowel prep.  Ask your health care provider how your surgical site will be marked or identified.  You may be given antibiotic medicine to help prevent infection.  Plan to have someone take you home after the procedure. PROCEDURE  To reduce your risk of infection:  Your health care team will wash or sanitize their hands.  Your skin will be washed with soap.  An IV tube will be inserted into one of your veins.  You will be given one or more of the following:  A medicine to help you relax (sedative).  A medicine to numb the area (local anesthetic).  A medicine to make you fall asleep (general anesthetic).  A medicine that is injected into an area of your body to numb everything below the injection site (regional anesthetic).  A lubricating jelly may be placed into your rectum.  Your surgeon will insert a short scope (anoscope) into your rectum to examine the hemorrhoids.  One of the following hemorrhoid procedures will be performed. Closed Hemorrhoidectomy  Your surgeon will use surgical instruments to open the tissue around the hemorrhoids.  The veins that supply the hemorrhoids will be tied off with a suture.  The hemorrhoids will be removed.  The tissue that surrounds the hemorrhoids will be closed with sutures that your body can absorb (absorbable sutures). Open Hemorrhoidectomy  The hemorrhoids will be removed with surgical instruments.  The incisions will be left open to heal without sutures. Stapled Hemorrhoidopexy  Your surgeon will use a circular stapling device to remove the hemorrhoids.  The device will be inserted into your anus. It will remove a circular ring of tissue that includes hemorrhoid tissue and some tissue above the hemorrhoids.  The staples in the device will close the edges of removed tissue. This will cut off the blood supply to the hemorrhoids and will pull any remaining hemorrhoids back into  place. Each of these procedures may vary among health care providers and hospitals. AFTER THE PROCEDURE  Your blood pressure, heart rate, breathing rate, and blood oxygen level will be monitored often until the medicines you were given have worn off.  You will be given pain medicine as needed.   This information is not intended to replace advice given to you by your health care provider. Make sure you discuss any questions you have with your health care provider.   Document Released: 04/19/2009 Document Revised: 03/13/2015 Document Reviewed: 09/17/2014 Elsevier Interactive Patient Education [employer].

## 2016-03-11 ENCOUNTER — Encounter (HOSPITAL_COMMUNITY)
Admission: RE | Admit: 2016-03-11 | Discharge: 2016-03-11 | Disposition: A | Discharge status: Home / Self Care | Payer: Medicare Other | Source: Ambulatory Visit | Attending: General Surgery | Admitting: General Surgery

## 2016-03-11 ENCOUNTER — Encounter (HOSPITAL_COMMUNITY): Payer: Self-pay

## 2016-03-11 ENCOUNTER — Other Ambulatory Visit: Payer: Self-pay

## 2016-03-11 DIAGNOSIS — E785 Hyperlipidemia, unspecified: Secondary | ICD-10-CM | POA: Diagnosis not present

## 2016-03-11 DIAGNOSIS — Z01812 Encounter for preprocedural laboratory examination: Secondary | ICD-10-CM | POA: Diagnosis not present

## 2016-03-11 DIAGNOSIS — I10 Essential (primary) hypertension: Secondary | ICD-10-CM | POA: Insufficient documentation

## 2016-03-11 HISTORY — DX: Reserved for inherently not codable concepts without codable children: IMO0001

## 2016-03-11 LAB — CBC WITH DIFFERENTIAL/PLATELET
BASOS PCT: 0 %
Basophils Absolute: 0 10*3/uL (ref 0.0–0.1)
EOS ABS: 0.1 10*3/uL (ref 0.0–0.7)
Eosinophils Relative: 1 %
HCT: 35.4 % — ABNORMAL LOW (ref 36.0–46.0)
HEMOGLOBIN: 11.4 g/dL — AB (ref 12.0–15.0)
LYMPHS ABS: 1.8 10*3/uL (ref 0.7–4.0)
Lymphocytes Relative: 30 %
MCH: 26.8 pg (ref 26.0–34.0)
MCHC: 32.2 g/dL (ref 30.0–36.0)
MCV: 83.1 fL (ref 78.0–100.0)
MONO ABS: 0.4 10*3/uL (ref 0.1–1.0)
MONOS PCT: 6 %
NEUTROS PCT: 63 %
Neutro Abs: 3.9 10*3/uL (ref 1.7–7.7)
Platelets: 308 10*3/uL (ref 150–400)
RBC: 4.26 MIL/uL (ref 3.87–5.11)
RDW: 14.2 % (ref 11.5–15.5)
WBC: 6.2 10*3/uL (ref 4.0–10.5)

## 2016-03-11 LAB — BASIC METABOLIC PANEL
Anion gap: 8 (ref 5–15)
BUN: 14 mg/dL (ref 6–20)
CALCIUM: 9 mg/dL (ref 8.9–10.3)
CHLORIDE: 101 mmol/L (ref 101–111)
CO2: 29 mmol/L (ref 22–32)
CREATININE: 0.82 mg/dL (ref 0.44–1.00)
GFR calc non Af Amer: >60 mL/min (ref >60)
GLUCOSE: 111 mg/dL — AB (ref 65–99)
Potassium: 3.4 mmol/L — ABNORMAL LOW (ref 3.5–5.1)
Sodium: 138 mmol/L (ref 135–145)

## 2016-03-16 ENCOUNTER — Ambulatory Visit (HOSPITAL_COMMUNITY): Payer: Medicare Other | Admitting: Anesthesiology

## 2016-03-16 ENCOUNTER — Encounter (HOSPITAL_COMMUNITY): Payer: Self-pay | Admitting: *Deleted

## 2016-03-16 ENCOUNTER — Ambulatory Visit (HOSPITAL_COMMUNITY)
Admission: RE | Admit: 2016-03-16 | Discharge: 2016-03-16 | Disposition: A | Discharge status: Home / Self Care | Payer: Medicare Other | Source: Ambulatory Visit | Attending: General Surgery | Admitting: General Surgery

## 2016-03-16 ENCOUNTER — Encounter (HOSPITAL_COMMUNITY)
Admission: RE | Disposition: A | Discharge status: Home / Self Care | Payer: Self-pay | Source: Ambulatory Visit | Attending: General Surgery

## 2016-03-16 DIAGNOSIS — E119 Type 2 diabetes mellitus without complications: Secondary | ICD-10-CM | POA: Diagnosis not present

## 2016-03-16 DIAGNOSIS — E7800 Pure hypercholesterolemia, unspecified: Secondary | ICD-10-CM | POA: Diagnosis not present

## 2016-03-16 DIAGNOSIS — Z7951 Long term (current) use of inhaled steroids: Secondary | ICD-10-CM | POA: Insufficient documentation

## 2016-03-16 DIAGNOSIS — Z79899 Other long term (current) drug therapy: Secondary | ICD-10-CM | POA: Insufficient documentation

## 2016-03-16 DIAGNOSIS — I10 Essential (primary) hypertension: Secondary | ICD-10-CM | POA: Insufficient documentation

## 2016-03-16 DIAGNOSIS — M1990 Unspecified osteoarthritis, unspecified site: Secondary | ICD-10-CM | POA: Insufficient documentation

## 2016-03-16 DIAGNOSIS — K649 Unspecified hemorrhoids: Secondary | ICD-10-CM | POA: Diagnosis present

## 2016-03-16 DIAGNOSIS — G4730 Sleep apnea, unspecified: Secondary | ICD-10-CM | POA: Insufficient documentation

## 2016-03-16 DIAGNOSIS — K648 Other hemorrhoids: Secondary | ICD-10-CM | POA: Insufficient documentation

## 2016-03-16 DIAGNOSIS — Z6841 Body Mass Index (BMI) 40.0 and over, adult: Secondary | ICD-10-CM | POA: Diagnosis not present

## 2016-03-16 DIAGNOSIS — K644 Residual hemorrhoidal skin tags: Secondary | ICD-10-CM | POA: Insufficient documentation

## 2016-03-16 DIAGNOSIS — J45909 Unspecified asthma, uncomplicated: Secondary | ICD-10-CM | POA: Insufficient documentation

## 2016-03-16 HISTORY — PX: HEMORRHOID SURGERY: SHX153

## 2016-03-16 LAB — GLUCOSE, CAPILLARY: GLUCOSE-CAPILLARY: 102 mg/dL — AB (ref 65–99)

## 2016-03-16 SURGERY — HEMORRHOIDECTOMY
Anesthesia: Monitor Anesthesia Care | Site: Rectum

## 2016-03-16 MED ORDER — OXYCODONE HCL 5 MG/5ML PO SOLN: 5.0000 mg | Freq: Once | ORAL | Status: AC | PRN

## 2016-03-16 MED ORDER — CHLORHEXIDINE GLUCONATE CLOTH 2 % EX PADS: 6.0000 | MEDICATED_PAD | Freq: Once | CUTANEOUS | Status: DC

## 2016-03-16 MED ORDER — LIDOCAINE VISCOUS 2 % MT SOLN
OROMUCOSAL | Status: AC
  Filled 2016-03-16: qty 15

## 2016-03-16 MED ORDER — LIDOCAINE VISCOUS 2 % MT SOLN
OROMUCOSAL | Status: DC | PRN
  Administered 2016-03-16: 1

## 2016-03-16 MED ORDER — KETOROLAC TROMETHAMINE 30 MG/ML IJ SOLN
INTRAMUSCULAR | Status: AC
  Filled 2016-03-16: qty 1

## 2016-03-16 MED ORDER — LIDOCAINE HCL (CARDIAC) 10 MG/ML IV SOLN
INTRAVENOUS | Status: DC | PRN
  Administered 2016-03-16: 50 mg via INTRAVENOUS

## 2016-03-16 MED ORDER — PROPOFOL 10 MG/ML IV BOLUS
INTRAVENOUS | Status: DC | PRN
  Administered 2016-03-16: 180 mg via INTRAVENOUS

## 2016-03-16 MED ORDER — SODIUM CHLORIDE 0.9 % IR SOLN
Status: DC | PRN
  Administered 2016-03-16: 500 mL

## 2016-03-16 MED ORDER — BUPIVACAINE HCL (PF) 0.5 % IJ SOLN
INTRAMUSCULAR | Status: AC
  Filled 2016-03-16: qty 30

## 2016-03-16 MED ORDER — KETOROLAC TROMETHAMINE 30 MG/ML IJ SOLN
30.0000 mg | Freq: Once | INTRAMUSCULAR | Status: AC
  Administered 2016-03-16: 30 mg via INTRAVENOUS

## 2016-03-16 MED ORDER — FENTANYL CITRATE (PF) 100 MCG/2ML IJ SOLN
INTRAMUSCULAR | Status: DC | PRN
  Administered 2016-03-16 (×4): 25 ug via INTRAVENOUS

## 2016-03-16 MED ORDER — METRONIDAZOLE IN NACL 5-0.79 MG/ML-% IV SOLN
INTRAVENOUS | Status: AC
  Filled 2016-03-16: qty 100

## 2016-03-16 MED ORDER — BUPIVACAINE HCL (PF) 0.5 % IJ SOLN
INTRAMUSCULAR | Status: DC | PRN
  Administered 2016-03-16: 9 mL

## 2016-03-16 MED ORDER — LIDOCAINE HCL (PF) 1 % IJ SOLN
INTRAMUSCULAR | Status: AC
  Filled 2016-03-16: qty 5

## 2016-03-16 MED ORDER — LACTATED RINGERS IV SOLN
INTRAVENOUS | Status: DC
  Administered 2016-03-16: 1000 mL via INTRAVENOUS

## 2016-03-16 MED ORDER — FENTANYL CITRATE (PF) 100 MCG/2ML IJ SOLN: 25.0000 ug | INTRAMUSCULAR | Status: DC | PRN

## 2016-03-16 MED ORDER — PROPOFOL 10 MG/ML IV BOLUS
INTRAVENOUS | Status: AC
  Filled 2016-03-16: qty 20

## 2016-03-16 MED ORDER — OXYCODONE HCL 5 MG PO TABS
ORAL_TABLET | ORAL | Status: AC
  Filled 2016-03-16: qty 1

## 2016-03-16 MED ORDER — ONDANSETRON HCL 4 MG/2ML IJ SOLN: 4.0000 mg | Freq: Four times a day (QID) | INTRAMUSCULAR | Status: DC | PRN

## 2016-03-16 MED ORDER — HYDROCODONE-ACETAMINOPHEN 5-325 MG PO TABS: 1.0000 | ORAL_TABLET | Freq: Four times a day (QID) | ORAL | 0 refills | Status: DC | PRN

## 2016-03-16 MED ORDER — FENTANYL CITRATE (PF) 100 MCG/2ML IJ SOLN
INTRAMUSCULAR | Status: AC
  Filled 2016-03-16: qty 2

## 2016-03-16 MED ORDER — OXYCODONE HCL 5 MG PO TABS
5.0000 mg | ORAL_TABLET | Freq: Once | ORAL | Status: AC | PRN
  Administered 2016-03-16: 5 mg via ORAL

## 2016-03-16 MED ORDER — METRONIDAZOLE IN NACL 5-0.79 MG/ML-% IV SOLN
500.0000 mg | INTRAVENOUS | Status: AC
  Administered 2016-03-16: 500 mg via INTRAVENOUS

## 2016-03-16 SURGICAL SUPPLY — 28 items
BAG HAMPER (MISCELLANEOUS) ×3 IMPLANT
CLOTH BEACON ORANGE TIMEOUT ST (SAFETY) ×3 IMPLANT
COVER LIGHT HANDLE STERIS (MISCELLANEOUS) ×6 IMPLANT
DECANTER SPIKE VIAL GLASS SM (MISCELLANEOUS) ×3 IMPLANT
DRAPE PROXIMA HALF (DRAPES) ×3 IMPLANT
ELECT REM PT RETURN 9FT ADLT (ELECTROSURGICAL) ×3
ELECTRODE REM PT RTRN 9FT ADLT (ELECTROSURGICAL) ×1 IMPLANT
FORMALIN 10 PREFIL 120ML (MISCELLANEOUS) ×3 IMPLANT
GAUZE SPONGE 4X4 12PLY STRL (GAUZE/BANDAGES/DRESSINGS) ×3 IMPLANT
GLOVE BIOGEL PI IND STRL 7.0 (GLOVE) ×1 IMPLANT
GLOVE BIOGEL PI INDICATOR 7.0 (GLOVE) ×2
GLOVE SURG SS PI 7.5 STRL IVOR (GLOVE) ×6 IMPLANT
GOWN STRL REUS W/ TWL XL LVL3 (GOWN DISPOSABLE) ×1 IMPLANT
GOWN STRL REUS W/TWL LRG LVL3 (GOWN DISPOSABLE) ×3 IMPLANT
GOWN STRL REUS W/TWL XL LVL3 (GOWN DISPOSABLE) ×2
HEMOSTAT SURGICEL 4X8 (HEMOSTASIS) ×3 IMPLANT
KIT ROOM TURNOVER AP CYSTO (KITS) ×3 IMPLANT
LIGASURE IMPACT 36 18CM CVD LR (INSTRUMENTS) ×3 IMPLANT
MANIFOLD NEPTUNE II (INSTRUMENTS) ×3 IMPLANT
NEEDLE HYPO 25X1 1.5 SAFETY (NEEDLE) ×3 IMPLANT
NS IRRIG 1000ML POUR BTL (IV SOLUTION) ×3 IMPLANT
PACK PERI GYN (CUSTOM PROCEDURE TRAY) ×3 IMPLANT
PAD ARMBOARD 7.5X6 YLW CONV (MISCELLANEOUS) ×3 IMPLANT
SET BASIN LINEN APH (SET/KITS/TRAYS/PACK) ×3 IMPLANT
SURGILUBE 3G PEEL PACK STRL (MISCELLANEOUS) ×3 IMPLANT
SUT SILK 0 FSL (SUTURE) ×3 IMPLANT
SUT VIC AB 2-0 CT2 27 (SUTURE) IMPLANT
SYR CONTROL 10ML LL (SYRINGE) ×3 IMPLANT

## 2016-03-16 NOTE — Op Note (Signed)
Patient:  Sandra [name]  DOB:  12/19/1950  MRN:  [id number]   Preop Diagnosis:  Internal and an external hemorrhoidal disease  Postop Diagnosis:  Same  Procedure:  Extensive hemorrhoidectomy  Surgeon:  Aviva Signs, M.D.  Anes:  Gen.  Indications:  Patient is a 65 year old black female who presents with an internal and external hemorrhoid at the 7:00 position with external hemorrhoidal skin tags at the 12:00 and 5:00 positions. The risks and benefits of the procedure including bleeding, infection, and recurrence of the hemorrhoidal disease were fully explained to the patient, who gave informed consent.  Procedure note:  The patient was placed in the lithotomy position after general anesthesia was a Company secretary. The perineum was prepped and draped using usual sterile technique with Betadine. Surgical site confirmation was performed.  On rectal examination, the patient had a large internal and external hemorrhoid at the 7:00 position. A smaller external hemorrhoidal skin tag was noted at the 5:00 position. Using the LigaSure, the 7:00 hemorrhoidal complex was excised in a collar-like fashion. Care was taken to avoid the external sphincter mechanism. The 5:00 external hemorrhoidal skin tag was also excised. Both were sent to pathology further examination. No abnormal bleeding was noted at the end of the procedure. 0.5% Sensorcaine was instilled into the surrounding perineum. Surgicel and Viscous Xylocaine rectal packing was then placed.  All tape and needle counts were correct at the end of the procedure. Patient was awakened and transferred to PACU in stable condition.  Complications:  None  EBL:  Minimal  Specimen:  Hemorrhoids

## 2016-03-16 NOTE — Interval H&P Note (Signed)
History and Physical Interval Note:  03/16/2016 7:09 AM  Sandra [name]  has presented today for surgery, with the diagnosis of internal and external hemorrhoids  The various methods of treatment have been discussed with the patient and family. After consideration of risks, benefits and other options for treatment, the patient has consented to  Procedure(s): EXTENSIVE HEMORRHOIDECTOMY (N/A) as a surgical intervention .  The patient's history has been reviewed, patient examined, no change in status, stable for surgery.  I have reviewed the patient's chart and labs.  Questions were answered to the patient's satisfaction.     Aviva Signs A

## 2016-03-16 NOTE — Addendum Note (Signed)
Addendum  created 03/16/16 0854 by Michele Rockers, CRNA   Anesthesia Intra Blocks edited, Anesthesia Intra Meds edited, LDA updated via procedure documentation, Sign clinical note

## 2016-03-16 NOTE — Anesthesia Postprocedure Evaluation (Signed)
Anesthesia Post Note  Patient: Sandra [name]  Procedure(s) Performed: Procedure(s) (LRB): EXTENSIVE HEMORRHOIDECTOMY (N/A)  Patient location during evaluation: PACU Anesthesia Type: General Level of consciousness: awake and alert, patient cooperative and responds to stimulation Pain management: pain level controlled Vital Signs Assessment: post-procedure vital signs reviewed and stable Respiratory status: spontaneous breathing, respiratory function stable and non-rebreather facemask Cardiovascular status: blood pressure returned to baseline and stable Anesthetic complications: no    Last Vitals:  Vitals:   03/16/16 0715 03/16/16 0730  BP: (!) 151/74 (!) 144/70  Resp: (!) 21 (!) 21  Temp:      Last Pain:  Vitals:   03/16/16 0657  TempSrc: Oral                 Sandra [name]

## 2016-03-16 NOTE — Anesthesia Preprocedure Evaluation (Addendum)
Anesthesia Evaluation  Patient identified by MRN, date of birth, ID band Patient awake    Reviewed: Allergy & Precautions, NPO status , Patient's Chart, lab work & pertinent test results  Airway Mallampati: II   Neck ROM: full    Dental   Pulmonary shortness of breath, asthma , sleep apnea ,    breath sounds clear to auscultation       Cardiovascular hypertension,  Rhythm:regular Rate:Normal     Neuro/Psych  Headaches,    GI/Hepatic GERD  ,  Endo/Other  diabetes, Type 2Morbid obesity  Renal/GU      Musculoskeletal  (+) Arthritis ,   Abdominal   Peds  Hematology   Anesthesia Other Findings   Reproductive/Obstetrics                             Anesthesia Physical Anesthesia Plan  ASA: III  Anesthesia Plan: MAC   Post-op Pain Management:    Induction: Intravenous  Airway Management Planned: LMA  Additional Equipment:   Intra-op Plan:   Post-operative Plan:   Informed Consent: I have reviewed the patients History and Physical, chart, labs and discussed the procedure including the risks, benefits and alternatives for the proposed anesthesia with the patient or authorized representative who has indicated his/her understanding and acceptance.     Plan Discussed with: CRNA, Anesthesiologist and Surgeon  Anesthesia Plan Comments:        Anesthesia Quick Evaluation

## 2016-03-16 NOTE — Anesthesia Procedure Notes (Addendum)
Procedure Name: LMA Insertion Date/Time: 03/16/2016 7:47 AM Performed by: Gershon Mussel, Keyah Blizard Pre-anesthesia Checklist: Patient identified, Patient being monitored, Timeout performed, Emergency Drugs available and Suction available Patient Re-evaluated:Patient Re-evaluated prior to inductionOxygen Delivery Method: Circle System Utilized Preoxygenation: Pre-oxygenation with 100% oxygen Intubation Type: IV induction Ventilation: Mask ventilation without difficulty LMA Size: 4.0 Number of attempts: 1 Placement Confirmation: breath sounds checked- equal and bilateral and positive ETCO2 Dental Injury: Teeth and Oropharynx as per pre-operative assessment

## 2016-03-16 NOTE — Transfer of Care (Signed)
Immediate Anesthesia Transfer of Care Note  Patient: Sandra [name]  Procedure(s) Performed: Procedure(s): EXTENSIVE HEMORRHOIDECTOMY (N/A)  Patient Location: PACU  Anesthesia Type:General  Level of Consciousness: awake, oriented, patient cooperative and responds to stimulation  Airway & Oxygen Therapy: Patient Spontanous Breathing and Patient connected to face mask oxygen  Post-op Assessment: Report given to RN, Post -op Vital signs reviewed and stable and Patient moving all extremities X 4  Post vital signs: Reviewed and stable  Last Vitals:  Vitals:   03/16/16 0715 03/16/16 0730  BP: (!) 151/74 (!) 144/70  Resp: (!) 21 (!) 21  Temp:      Last Pain:  Vitals:   03/16/16 0657  TempSrc: Oral      Patients Stated Pain Goal: 7 ([id number])  Complications: No apparent anesthesia complications

## 2016-03-16 NOTE — Discharge Instructions (Signed)
Surgical Procedures for Hemorrhoids, Care After Refer to this sheet in the next few weeks. These instructions provide you with information about caring for yourself after your procedure. Your health care provider may also give you more specific instructions. Your treatment has been planned according to current medical practices, but problems sometimes occur. Call your health care provider if you have any problems or questions after your procedure. WHAT TO EXPECT AFTER THE PROCEDURE After your procedure, it is common to have:  Rectal pain.  Pain when you are having a bowel movement.  Slight rectal bleeding. HOME CARE INSTRUCTIONS Medicines  Take over-the-counter and prescription medicines only as told by your health care provider.  Do not drive or operate heavy machinery while taking prescription pain medicine.  Use a stool softener or a bulk laxative as told by your health care provider. Activities  Rest at home. Return to your normal activities as told by your health care provider.  Do not lift anything that is heavier than 10 lb (4.5 kg).  Do not sit for long periods of time. Take a walk every day or as told by your health care provider.  Do not strain to have a bowel movement. Do not spend a long time sitting on the toilet. Eating and Drinking  Eat foods that contain fiber, such as whole grains, beans, nuts, fruits, and vegetables.  Drink enough fluid to keep your urine clear or pale yellow. General Instructions  Sit in a warm bath 2-3 times per day to relieve soreness or itching.  Keep all follow-up visits as told by your health care provider. This is important. SEEK MEDICAL CARE IF:  Your pain medicine is not helping.  You have a fever or chills.  You become constipated.  You have trouble passing urine. SEEK IMMEDIATE MEDICAL CARE IF:  You have very bad rectal pain.  You have heavy bleeding from your rectum.   This information is not intended to replace advice  given to you by your health care provider. Make sure you discuss any questions you have with your health care provider.   Document Released: 09/12/2003 Document Revised: 03/13/2015 Document Reviewed: 09/17/2014 Elsevier Interactive Patient Education 2016 [employer] PATIENT INSTRUCTIONS POST-ANESTHESIA  IMMEDIATELY FOLLOWING SURGERY:  Do not drive or operate machinery for the first twenty four hours after surgery.  Do not make any important decisions for twenty four hours after surgery or while taking narcotic pain medications or sedatives.  If you develop intractable nausea and vomiting or a severe headache please notify your doctor immediately.  FOLLOW-UP:  Please make an appointment with your surgeon as instructed. You do not need to follow up with anesthesia unless specifically instructed to do so.  WOUND CARE INSTRUCTIONS (if applicable):  Keep a dry clean dressing on the anesthesia/puncture wound site if there is drainage.  Once the wound has quit draining you may leave it open to air.  Generally you should leave the bandage intact for twenty four hours unless there is drainage.  If the epidural site drains for more than 36-48 hours please call the anesthesia department.  QUESTIONS?:  Please feel free to call your physician or the hospital operator if you have any questions, and they will be happy to assist you.

## 2016-03-16 NOTE — Addendum Note (Signed)
Addendum  created 03/16/16 [id number] by Michele Rockers, CRNA   Anesthesia Intra Blocks edited, LDA updated via procedure documentation, Sign clinical note

## 2016-03-19 ENCOUNTER — Encounter (HOSPITAL_COMMUNITY): Payer: Self-pay | Admitting: General Surgery

## 2016-04-10 ENCOUNTER — Other Ambulatory Visit (HOSPITAL_COMMUNITY): Payer: Self-pay | Admitting: Family Medicine

## 2016-04-10 DIAGNOSIS — Z1231 Encounter for screening mammogram for malignant neoplasm of breast: Secondary | ICD-10-CM

## 2016-04-16 ENCOUNTER — Encounter: Payer: Self-pay | Admitting: Gastroenterology

## 2016-04-16 ENCOUNTER — Ambulatory Visit (HOSPITAL_COMMUNITY)
Admission: RE | Admit: 2016-04-16 | Discharge: 2016-04-16 | Disposition: A | Discharge status: Home / Self Care | Payer: Medicare Other | Source: Ambulatory Visit | Attending: Family Medicine | Admitting: Family Medicine

## 2016-04-16 DIAGNOSIS — Z1231 Encounter for screening mammogram for malignant neoplasm of breast: Secondary | ICD-10-CM | POA: Diagnosis present

## 2016-04-22 ENCOUNTER — Ambulatory Visit (HOSPITAL_COMMUNITY)
Admission: RE | Admit: 2016-04-22 | Discharge: 2016-04-22 | Disposition: A | Discharge status: Home / Self Care | Payer: Medicare Other | Source: Ambulatory Visit | Attending: Family Medicine | Admitting: Family Medicine

## 2016-04-22 ENCOUNTER — Other Ambulatory Visit (HOSPITAL_COMMUNITY)
Admission: RE | Admit: 2016-04-22 | Discharge: 2016-04-22 | Disposition: A | Discharge status: Home / Self Care | Payer: Medicare Other | Source: Ambulatory Visit | Attending: Family Medicine | Admitting: Family Medicine

## 2016-04-22 ENCOUNTER — Other Ambulatory Visit (HOSPITAL_COMMUNITY): Payer: Self-pay | Admitting: Family Medicine

## 2016-04-22 DIAGNOSIS — R0602 Shortness of breath: Secondary | ICD-10-CM | POA: Diagnosis present

## 2016-04-22 DIAGNOSIS — I10 Essential (primary) hypertension: Secondary | ICD-10-CM | POA: Insufficient documentation

## 2016-04-22 DIAGNOSIS — E1140 Type 2 diabetes mellitus with diabetic neuropathy, unspecified: Secondary | ICD-10-CM | POA: Diagnosis present

## 2016-04-22 DIAGNOSIS — R5382 Chronic fatigue, unspecified: Secondary | ICD-10-CM | POA: Insufficient documentation

## 2016-04-22 LAB — COMPREHENSIVE METABOLIC PANEL
ALBUMIN: 4 g/dL (ref 3.5–5.0)
ALT: 14 U/L (ref 14–54)
AST: 20 U/L (ref 15–41)
Alkaline Phosphatase: 110 U/L (ref 38–126)
Anion gap: 9 (ref 5–15)
BUN: 17 mg/dL (ref 6–20)
CHLORIDE: 99 mmol/L — AB (ref 101–111)
CO2: 29 mmol/L (ref 22–32)
CREATININE: 0.89 mg/dL (ref 0.44–1.00)
Calcium: 9.5 mg/dL (ref 8.9–10.3)
GFR calc non Af Amer: >60 mL/min (ref >60)
Glucose, Bld: 96 mg/dL (ref 65–99)
Potassium: 3.5 mmol/L (ref 3.5–5.1)
SODIUM: 137 mmol/L (ref 135–145)
Total Bilirubin: 0.6 mg/dL (ref 0.3–1.2)
Total Protein: 8.3 g/dL — ABNORMAL HIGH (ref 6.5–8.1)

## 2016-04-22 LAB — TSH: TSH: 2.235 u[IU]/mL (ref 0.350–4.500)

## 2016-04-22 LAB — CBC WITH DIFFERENTIAL/PLATELET
BASOS PCT: 0 %
Basophils Absolute: 0 10*3/uL (ref 0.0–0.1)
EOS ABS: 0.1 10*3/uL (ref 0.0–0.7)
Eosinophils Relative: 2 %
HCT: 37.4 % (ref 36.0–46.0)
HEMOGLOBIN: 12.3 g/dL (ref 12.0–15.0)
Lymphocytes Relative: 38 %
Lymphs Abs: 2.6 10*3/uL (ref 0.7–4.0)
MCH: 27.2 pg (ref 26.0–34.0)
MCHC: 32.9 g/dL (ref 30.0–36.0)
MCV: 82.6 fL (ref 78.0–100.0)
MONO ABS: 0.4 10*3/uL (ref 0.1–1.0)
MONOS PCT: 5 %
NEUTROS PCT: 55 %
Neutro Abs: 3.6 10*3/uL (ref 1.7–7.7)
PLATELETS: 293 10*3/uL (ref 150–400)
RBC: 4.53 MIL/uL (ref 3.87–5.11)
RDW: 14.7 % (ref 11.5–15.5)
WBC: 6.7 10*3/uL (ref 4.0–10.5)

## 2016-04-22 LAB — LIPID PANEL
Cholesterol: 134 mg/dL (ref 0–200)
HDL: 44 mg/dL (ref >40)
LDL Cholesterol: 74 mg/dL (ref 0–99)
TRIGLYCERIDES: 82 mg/dL (ref <150)
Total CHOL/HDL Ratio: 3 RATIO
VLDL: 16 mg/dL (ref 0–40)

## 2016-04-22 LAB — BRAIN NATRIURETIC PEPTIDE: B Natriuretic Peptide: 102 pg/mL — ABNORMAL HIGH (ref 0.0–100.0)

## 2016-04-22 LAB — SEDIMENTATION RATE: SED RATE: 58 mm/h — AB (ref 0–22)

## 2016-04-23 LAB — HEMOGLOBIN A1C
HEMOGLOBIN A1C: 6.2 % — AB (ref 4.8–5.6)
MEAN PLASMA GLUCOSE: 131 mg/dL

## 2016-04-29 ENCOUNTER — Telehealth: Payer: Self-pay

## 2016-04-29 MED ORDER — FLUTICASONE PROPIONATE HFA 110 MCG/ACT IN AERO: 2.0000 | INHALATION_SPRAY | Freq: Two times a day (BID) | RESPIRATORY_TRACT | 1 refills | Status: DC

## 2016-04-29 NOTE — Telephone Encounter (Signed)
Dr. Gallagher please advise.  

## 2016-04-29 NOTE — Telephone Encounter (Signed)
Pt's insurance does not cover Qvar. Pt does not have a future appointment. Last seen in April with Dr. Ishmael Holter.

## 2016-04-29 NOTE — Telephone Encounter (Signed)
I replaced the Qvar with Flovent, prescription sent in. I only gave one refill, therefore the patient needs to make an appointment in the next couple of months.  Thanks, Salvatore Marvel, MD [facility]

## 2016-04-29 NOTE — Telephone Encounter (Signed)
Spoke to the patient and informed her that went changed her inhaler and that she needed to make a follow up appointment.

## 2016-06-23 ENCOUNTER — Ambulatory Visit: Payer: Medicare Other | Admitting: Allergy & Immunology

## 2016-08-28 IMAGING — US US EXTREM LOW VENOUS BILAT
1 series · 13 of 24 positions shown · non-contrast
Comparison: None.

CLINICAL DATA: Unexplained elevated D-dimer.



[Series 1: us extrem low venous bilat · 0.10mm/px · 13 of 62 slices shown]
[im 1/62]
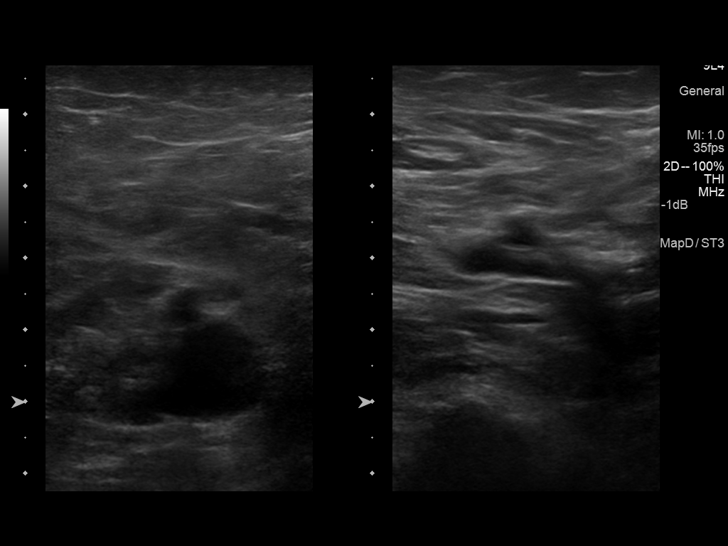
[im 6/62]
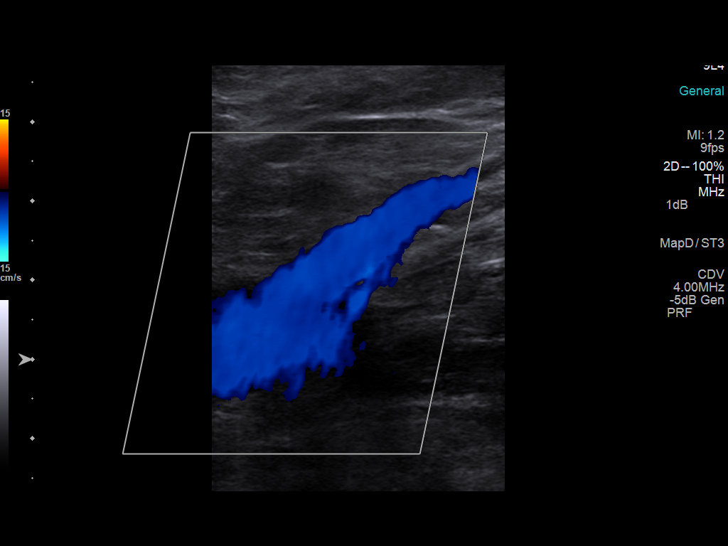
[im 11/62]
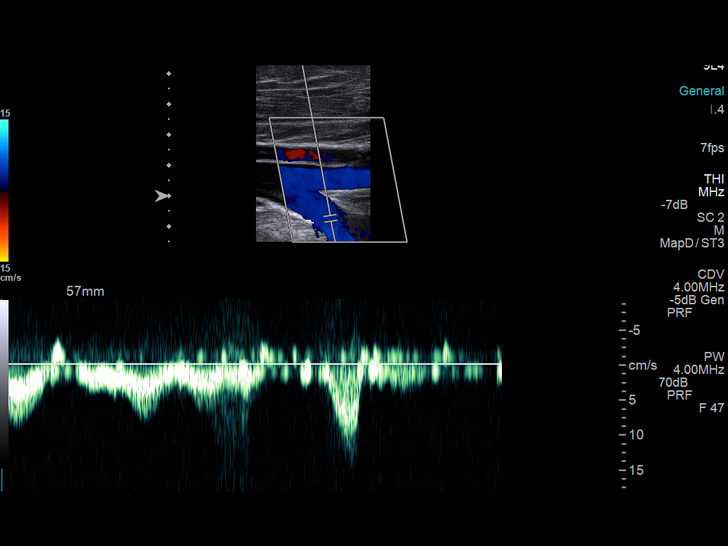
[im 16/62]
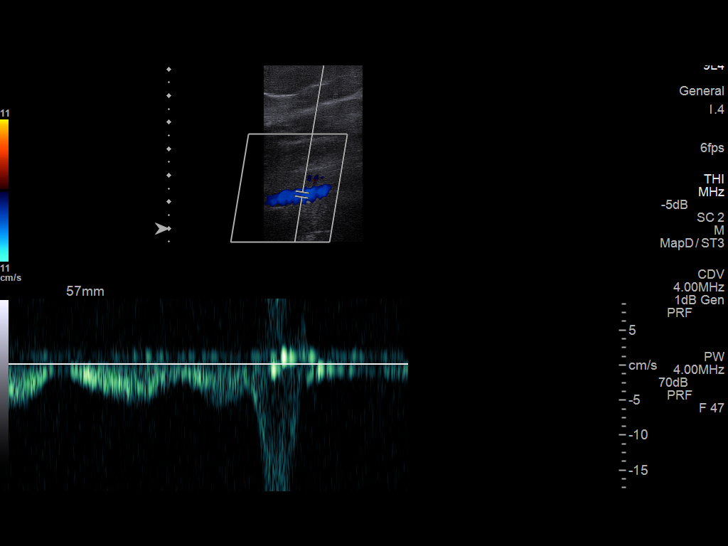
[im 22/62]
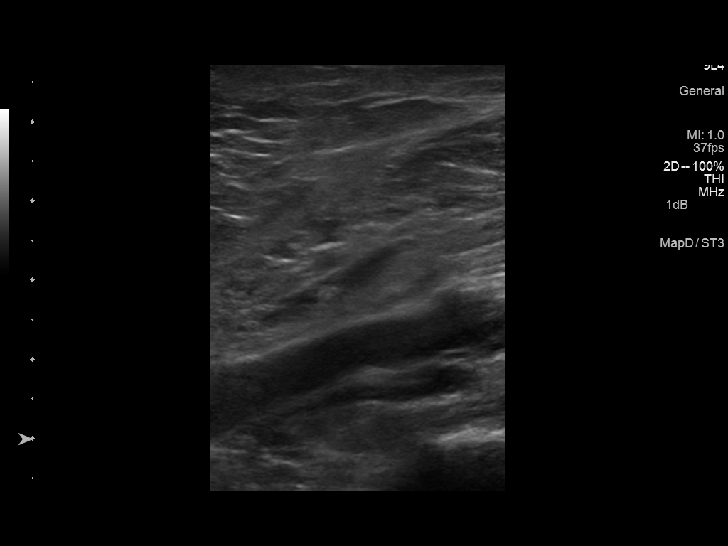
[im 27/62]
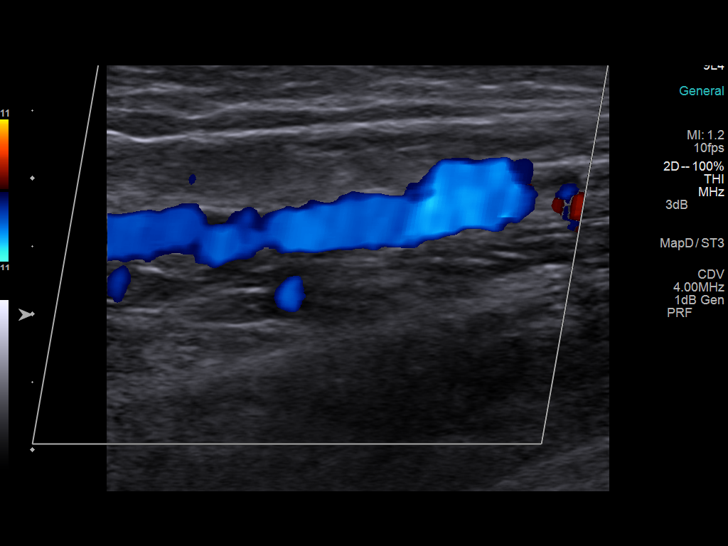
[im 32/62]
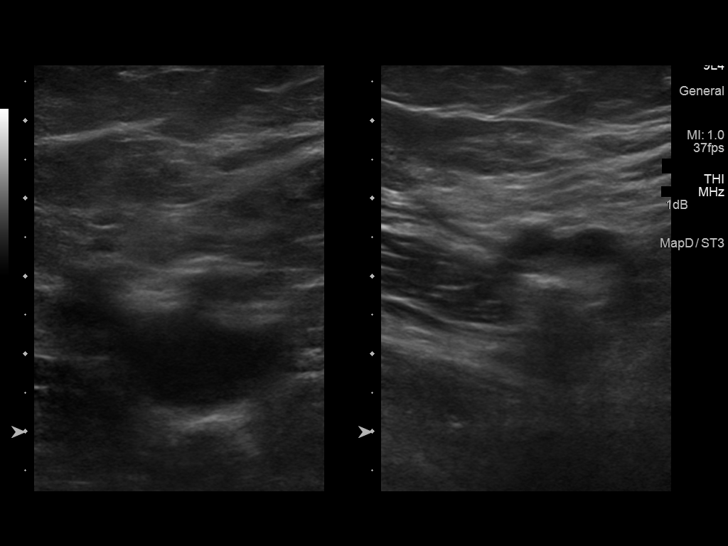
[im 35/62]
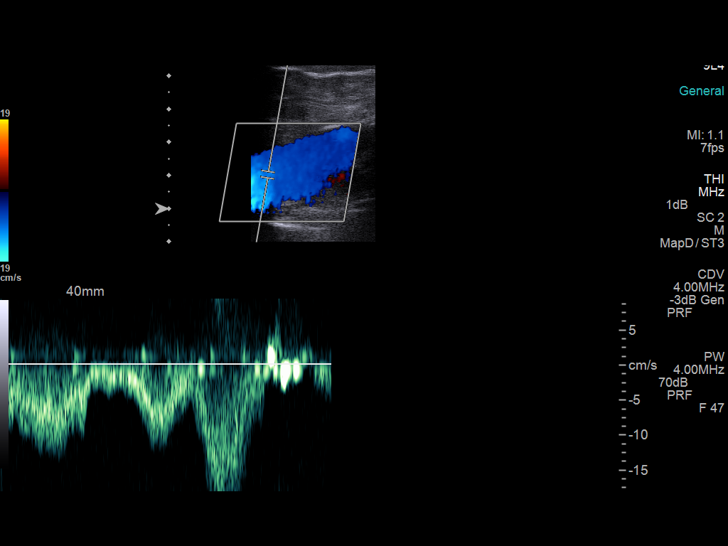
[im 40/62]
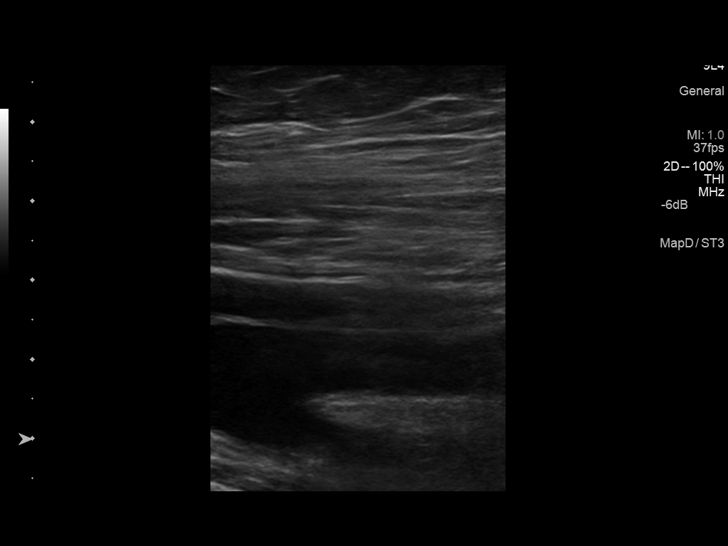
[im 46/62]
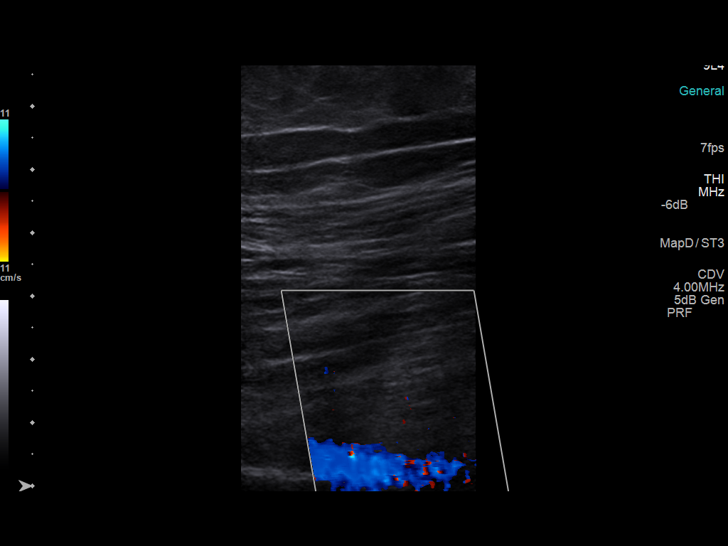
[im 51/62]
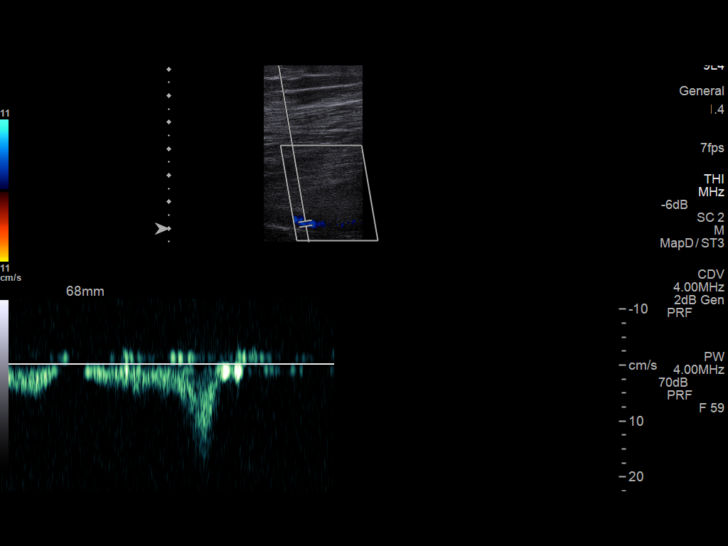
[im 56/62]
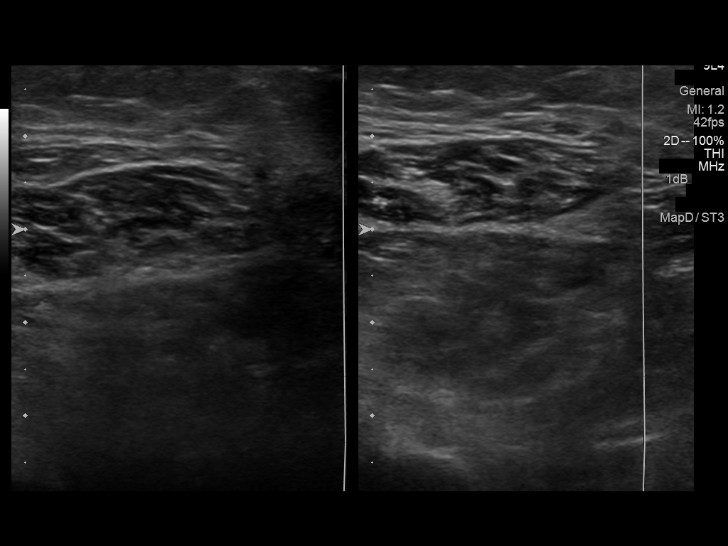
[im 62/62]
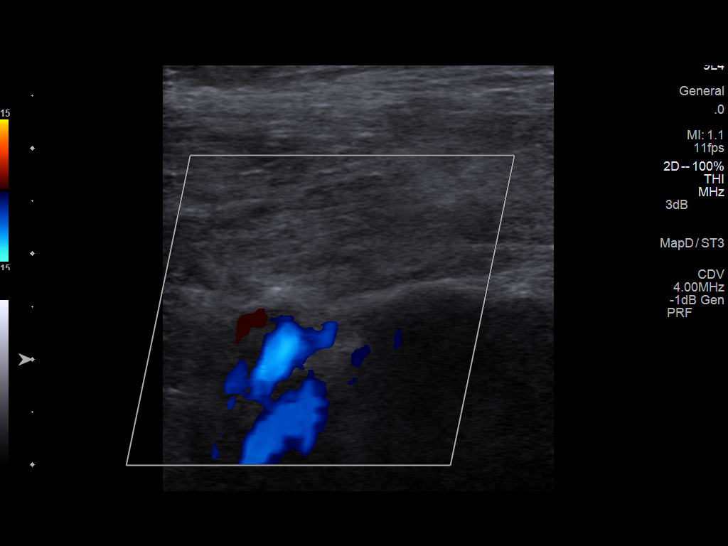

[13 of 24 positions shown; findings below may reference images not displayed]

FINDINGS: RIGHT LOWER EXTREMITY

Common Femoral Vein: No evidence of thrombus. Normal
compressibility, respiratory phasicity and response to augmentation.

Saphenofemoral Junction: No evidence of thrombus. Normal
compressibility and flow on color Doppler imaging.

Profunda Femoral Vein: No evidence of thrombus. Normal
compressibility and flow on color Doppler imaging.

Femoral Vein: No evidence of thrombus. Normal compressibility,
respiratory phasicity and response to augmentation.

Popliteal Vein: No evidence of thrombus. Normal compressibility,
respiratory phasicity and response to augmentation.

Calf Veins: No evidence of thrombus. Normal compressibility and flow
on color Doppler imaging.

Superficial Great Saphenous Vein: No evidence of thrombus. Normal
compressibility and flow on color Doppler imaging.

Venous Reflux:  Not evaluated.

Other Findings:  None.

LEFT LOWER EXTREMITY

Common Femoral Vein: No evidence of thrombus. Normal
compressibility, respiratory phasicity and response to augmentation.

Saphenofemoral Junction: No evidence of thrombus. Normal
compressibility and flow on color Doppler imaging.

Profunda Femoral Vein: No evidence of thrombus. Normal
compressibility and flow on color Doppler imaging.

Femoral Vein: No evidence of thrombus. Normal compressibility,
respiratory phasicity and response to augmentation.

Popliteal Vein: No evidence of thrombus. Normal compressibility,
respiratory phasicity and response to augmentation.

Calf Veins: No evidence of thrombus. Normal compressibility and flow
on color Doppler imaging.

Superficial Great Saphenous Vein: No evidence of thrombus. Normal
compressibility and flow on color Doppler imaging.

Venous Reflux:  Not evaluated.

Other Findings:  None.
IMPRESSION: No evidence of DVT or superficial thrombophlebitis involving either
the right or left lower extremity.

## 2016-08-28 IMAGING — CT CT ANGIO CHEST
2 of 6 series · 6 of 36 positions shown · IV contrast (Omnipaque 300)
Comparison: Chest radiograph 03/24/2014

CLINICAL DATA: Shortness of breath.  Chest pain.  Elevated D-dimer.

EXAM:
CT ANGIOGRAPHY CHEST WITH CONTRAST
TECHNIQUE: Multidetector CT imaging of the chest was performed using the
standard protocol during bolus administration of intravenous
contrast. Multiplanar CT image reconstructions and MIPs were
obtained to evaluate the vascular anatomy.
CONTRAST:  100mL OMNIPAQUE IOHEXOL 350 MG/ML SOLN

[Series 4: pe 3.0 b40f · axial · 0.62mm/px · z∈[-249,-90]mm · 5 of 81 slices shown]
[im 14/81  lung]
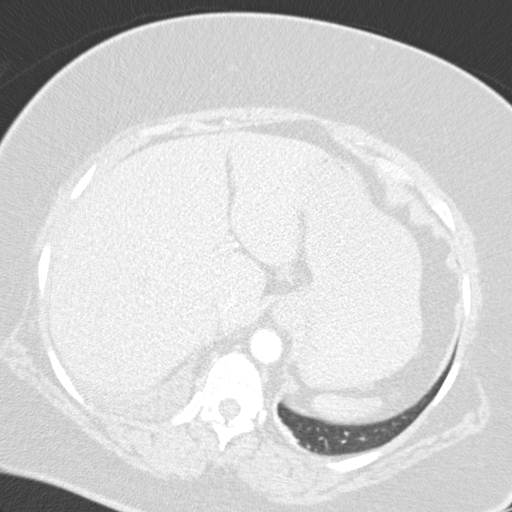
[im 27/81  mediastinal]
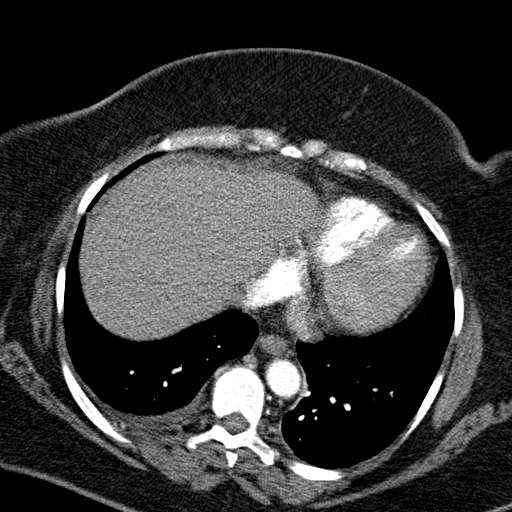
[im 41/81  lung]
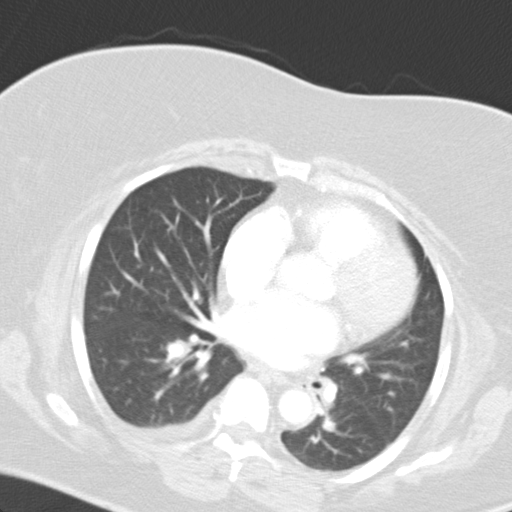
[im 54/81  mediastinal]
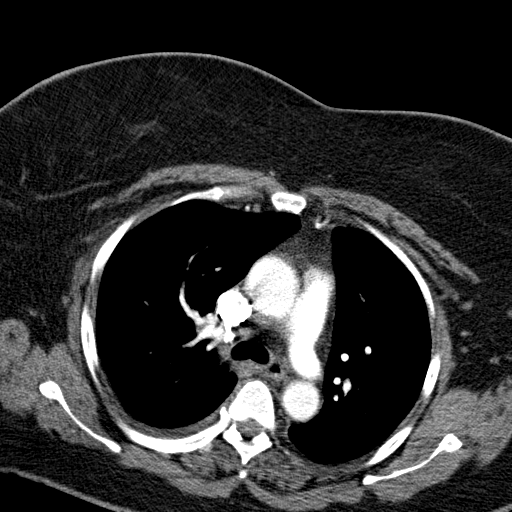
[im 67/81  lung]
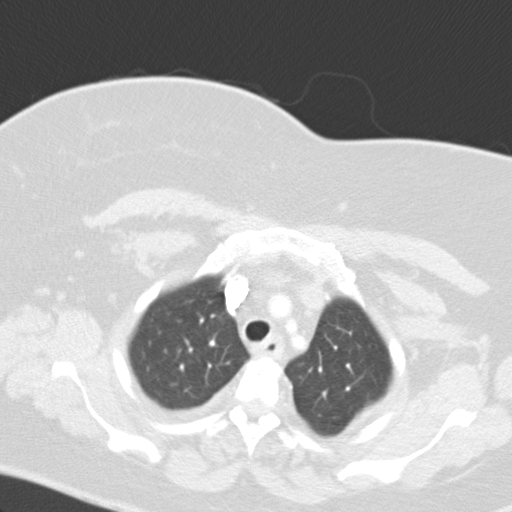

[Series 6: mpr coronal pe 3mm · coronal · 0.50mm/px · 1 of 88 slices shown]
[im 44/88  mediastinal]
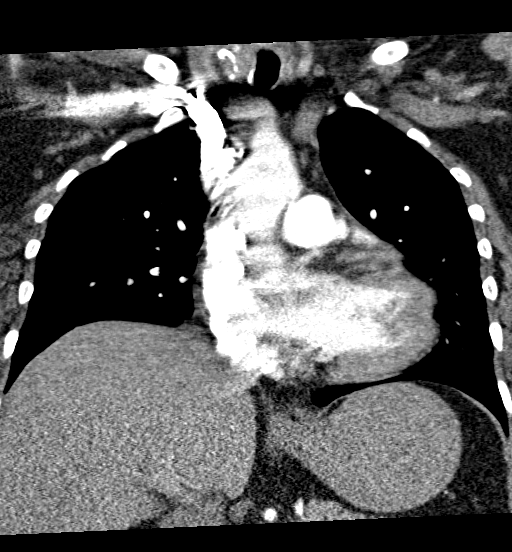

[6 of 36 positions shown; findings below may reference images not displayed]

FINDINGS: Evaluation limited secondary to motion artifact. Within the above
limitations, no definite evidence for pulmonary embolism.

Small peripherally calcified nodule within the right lobe of thyroid
measuring 11 mm. No enlarged axillary, mediastinal or hilar
lymphadenopathy. Normal heart size. No pericardial effusion.

Central airways are patent. There is a small right pleural effusion.
Subpleural consolidative opacity within the right lower lobe is
favored to represent atelectasis. No pneumothorax.

Limited visualization of the upper abdomen demonstrates no focal
abnormality. No aggressive or acute appearing osseous lesions.

Review of the MIP images confirms the above findings.
IMPRESSION: Examination limited secondary to motion artifact. Within the above
limitations no definite evidence for pulmonary embolism.

Small right pleural effusion and underlying consolidative opacities
within the right lower lobe suggestive of atelectasis.

## 2016-12-24 ENCOUNTER — Other Ambulatory Visit: Payer: Self-pay | Admitting: Gastroenterology

## 2016-12-25 ENCOUNTER — Ambulatory Visit (INDEPENDENT_AMBULATORY_CARE_PROVIDER_SITE_OTHER): Payer: Medicare Other | Admitting: Obstetrics & Gynecology

## 2016-12-25 ENCOUNTER — Encounter: Payer: Self-pay | Admitting: Obstetrics & Gynecology

## 2016-12-25 VITALS — BP 120/84 | HR 68 | Wt 230.0 lb

## 2016-12-25 DIAGNOSIS — B373 Candidiasis of vulva and vagina: Secondary | ICD-10-CM | POA: Diagnosis not present

## 2016-12-25 DIAGNOSIS — N8111 Cystocele, midline: Secondary | ICD-10-CM | POA: Diagnosis not present

## 2016-12-25 DIAGNOSIS — B3731 Acute candidiasis of vulva and vagina: Secondary | ICD-10-CM

## 2016-12-25 MED ORDER — FLUCONAZOLE 100 MG PO TABS: 100.0000 mg | ORAL_TABLET | Freq: Every day | ORAL | 0 refills | Status: DC

## 2016-12-25 NOTE — Progress Notes (Signed)
Chief Complaint  Patient presents with  . foul odor with urination    Blood pressure 120/84, pulse 68, weight 230 lb (104.3 kg).  66 y.o. [id number] No LMP recorded. Patient has had a hysterectomy. The current method of family planning is status post hysterectomy.  Outpatient Encounter Prescriptions as of 12/25/2016  Medication Sig  . albuterol (PROVENTIL HFA;VENTOLIN HFA) 108 (90 BASE) MCG/ACT inhaler Inhale 1-2 puffs into the lungs every 6 (six) hours as needed for wheezing or shortness of breath.  Marland Kitchen aspirin EC 81 MG tablet Take 81 mg by mouth daily.  Marland Kitchen atorvastatin (LIPITOR) 10 MG tablet Take 1 tablet (10 mg total) by mouth daily.  . calcium-vitamin D (OSCAL WITH D) 500-200 MG-UNIT tablet Take 1 tablet by mouth 2 (two) times daily.  Marland Kitchen diltiazem (DILACOR XR) 180 MG 24 hr capsule Take 180 mg by mouth daily.  . fluticasone (FLONASE) 50 MCG/ACT nasal spray Place into both nostrils daily.  . hydrochlorothiazide (HYDRODIURIL) 25 MG tablet Take 1 tablet (25 mg total) by mouth daily.  . metoprolol tartrate (LOPRESSOR) 25 MG tablet Take 1 tablet (25 mg total) by mouth 2 (two) times daily.  Marland Kitchen omeprazole (PRILOSEC) 20 MG capsule TAKE 1 CAPSULE 30 MINUTES BEFORE BREAKFAST AND AT BEDTIME.  Marland Kitchen albuterol (PROVENTIL) (2.5 MG/3ML) 0.083% nebulizer solution Take 2.5 mg by nebulization every 6 (six) hours as needed for wheezing or shortness of breath.  . beclomethasone (QVAR) 80 MCG/ACT inhaler Use 2 puffs twice daily to prevent cough or wheeze.  Rinse, gargle, and spit after use. (Patient not taking: Reported on 12/25/2016)  . fluconazole (DIFLUCAN) 100 MG tablet Take 1 tablet (100 mg total) by mouth daily.  . fluticasone (FLOVENT HFA) 110 MCG/ACT inhaler Inhale 2 puffs into the lungs 2 (two) times daily.  Marland Kitchen HYDROcodone-acetaminophen (NORCO) 5-325 MG tablet Take 1 tablet by mouth every 6 (six) hours as needed for moderate pain. (Patient not taking: Reported on 12/25/2016)   No facility-administered  encounter medications on file as of 12/25/2016.     Subjective Sandra [name] is in today as a new patient 66 year old lady who's had a hysterectomy and "" bladder tack in the past She is in today stating that she is having vulvar and perivaginal itching with no discharge Some  odor when she sweats No bleeding No intercourse and 30 years She gets up twice at night use the bathroom and has urinary loss if she doesn't get to the bathroom in time She has frequency and urgency   Objective General WDWN female NAD Vulva:  Ration does have evidence of yeast on each buttock cheek with a dry scaly appearance Vagina:  Atrophic with a grade 2-3 anterior compartment prolapse, good vaginal vault apex support and all rectocele Cervix:  Absent Uterus:  Absent Adnexa: ovaries:not present,    Gentian violet used intravaginally vulvar perirectal and her buttock  Pertinent ROS Per HPI  No burning with urination,  No nausea, vomiting or diarrhea Nor fever chills or other constitutional symptoms    Labs or studies None today    Impression Diagnoses this Encounter::   ICD-10-CM   1. Vulvovaginitis due to yeast B37.3   2. Cystocele, midline N81.11     Established relevant diagnosis(es):   Plan/Recommendations: Meds ordered this encounter  Medications  . diltiazem (DILACOR XR) 180 MG 24 hr capsule    Sig: Take 180 mg by mouth daily.  . fluticasone (FLONASE) 50 MCG/ACT nasal spray    Sig: Place  into both nostrils daily.  . fluconazole (DIFLUCAN) 100 MG tablet    Sig: Take 1 tablet (100 mg total) by mouth daily.    Dispense:  14 tablet    Refill:  0    Labs or Scans Ordered: No orders of the defined types were placed in this encounter.   Management:: Patient was treated with gentian violet both intravaginal vulvar perirectal and on her buttock Going to give her Diflucan 100 mg a day for the next 14 days I'll up in 3 weeks and at that time we'll discuss possible use of a  pessary for bladder support  Follow up Return in about 3 weeks (around 01/15/2017).       All questions were answered.  Past Medical History:  Diagnosis Date  . Acid reflux 2000   HA with nexium   . Anemia   . Angio-edema    caused by Lisinopril  . Arthritis   . Chest pain, unspecified 03/25/2014   MI ruled out. CTA negative PE  . Diabetes mellitus 2012   borderline diabetes, never on medicine   . Enteritis 04/23/2011  . Hypercholesteremia 2011  . Hypertension 1990s   . Ileitis 03/24/2014  . Moderate persistent asthma in adult without complication 1/61/0960  . Shortness of breath dyspnea   . Sleep apnea     Past Surgical History:  Procedure Laterality Date  . ABDOMINAL HYSTERECTOMY  1983   . CATARACT EXTRACTION W/PHACO Right 01/01/2015   Procedure: CATARACT EXTRACTION PHACO AND INTRAOCULAR LENS PLACEMENT (IOC);  Surgeon: Rutherford Guys, MD;  Location: [facility];  Service: Ophthalmology;  Laterality: Right;  CDE:5.11  . CATARACT EXTRACTION W/PHACO Left 01/15/2015   Procedure: CATARACT EXTRACTION PHACO AND INTRAOCULAR LENS PLACEMENT (IOC);  Surgeon: Rutherford Guys, MD;  Location: [facility];  Service: Ophthalmology;  Laterality: Left;  CDE:4.11  . COLONOSCOPY N/A 07/22/2015   Procedure: COLONOSCOPY;  Surgeon: Danie Binder, MD;  [facility];  Service: Endoscopy;  Laterality: N/A;  1245pm  . ESOPHAGOGASTRODUODENOSCOPY N/A 07/22/2015   Procedure: ESOPHAGOGASTRODUODENOSCOPY (EGD);  Surgeon: Danie Binder, MD;  [facility];  Service: Endoscopy;  Laterality: N/A;  . HEMORRHOID SURGERY N/A 03/16/2016   Procedure: EXTENSIVE HEMORRHOIDECTOMY;  Surgeon: Aviva Signs, MD;  Location: [facility];  Service: General;  Laterality: N/A;  . [place]   . SAVORY DILATION N/A 07/22/2015   Procedure: SAVORY DILATION;  Surgeon: Danie Binder, MD;  [facility]: [facility];  Service: Endoscopy;  Laterality: N/A;  . TONSILLECTOMY    . TUBAL LIGATION  1983     OB History     Gravida Para Term Preterm AB Living   3 3 3     2    SAB TAB Ectopic Multiple Live Births                    Social History   Social History  . Marital status: Divorced    Spouse name: N/A  . Number of children: 3   . Years of education: some colle   Occupational History  . Retired      Former [employer]arry manager    Social History Main Topics  . Smoking status: Never Smoker  . Smokeless tobacco: Never Used  . Alcohol use No  . Drug use: No  . Sexual activity: Not Currently    Birth control/ protection: Surgical   Other Topics Concern  . None   Social History Narrative   3 children-2 living, sons.  Lives alone. 7 grandchildren. 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     Family History  Problem Relation Age of Onset  . Diabetes Mother   . Heart disease Mother   . Stroke Mother   . Cancer Daughter 52       breast   . Allergic rhinitis Daughter   . Allergic rhinitis Son   . Seizures Son   . Diabetes Maternal Aunt   . Cancer Maternal Aunt 60       COLON CANCER  . Diabetes Maternal Uncle   . Asthma Brother   . Eczema Brother   . Asthma Grandchild   . Diabetes Sister   . Hypertension Sister   . Kidney disease Sister   . Emphysema Brother        smoked  . Angioedema Neg Hx   . Urticaria Neg Hx   . Immunodeficiency Neg Hx

## 2017-01-15 ENCOUNTER — Encounter: Payer: Self-pay | Admitting: Obstetrics & Gynecology

## 2017-01-15 ENCOUNTER — Ambulatory Visit (INDEPENDENT_AMBULATORY_CARE_PROVIDER_SITE_OTHER): Payer: Medicare Other | Admitting: Obstetrics & Gynecology

## 2017-01-15 VITALS — BP 128/76 | HR 77 | Wt 229.0 lb

## 2017-01-15 DIAGNOSIS — N3941 Urge incontinence: Secondary | ICD-10-CM | POA: Diagnosis not present

## 2017-01-15 DIAGNOSIS — N8111 Cystocele, midline: Secondary | ICD-10-CM | POA: Diagnosis not present

## 2017-01-15 DIAGNOSIS — B3731 Acute candidiasis of vulva and vagina: Secondary | ICD-10-CM

## 2017-01-15 DIAGNOSIS — B373 Candidiasis of vulva and vagina: Secondary | ICD-10-CM | POA: Diagnosis not present

## 2017-01-15 MED ORDER — OXYBUTYNIN CHLORIDE ER 5 MG PO TB24: 5.0000 mg | ORAL_TABLET | Freq: Every day | ORAL | 1 refills | Status: DC

## 2017-01-15 NOTE — Progress Notes (Signed)
Chief Complaint  Patient presents with  . Follow-up    Blood pressure 128/76, pulse 77, weight 229 lb (103.9 kg).  66 y.o. [id number] No LMP recorded. Patient has had a hysterectomy. The current method of family planning is status post hysterectomy.  Outpatient Encounter Prescriptions as of 01/15/2017  Medication Sig  . albuterol (PROVENTIL HFA;VENTOLIN HFA) 108 (90 BASE) MCG/ACT inhaler Inhale 1-2 puffs into the lungs every 6 (six) hours as needed for wheezing or shortness of breath.  Marland Kitchen albuterol (PROVENTIL) (2.5 MG/3ML) 0.083% nebulizer solution Take 2.5 mg by nebulization every 6 (six) hours as needed for wheezing or shortness of breath.  Marland Kitchen aspirin EC 81 MG tablet Take 81 mg by mouth daily.  Marland Kitchen atorvastatin (LIPITOR) 10 MG tablet Take 1 tablet (10 mg total) by mouth daily.  Marland Kitchen diltiazem (DILACOR XR) 180 MG 24 hr capsule Take 180 mg by mouth daily.  . fluconazole (DIFLUCAN) 100 MG tablet Take 1 tablet (100 mg total) by mouth daily.  . fluticasone (FLONASE) 50 MCG/ACT nasal spray Place into both nostrils daily.  . hydrochlorothiazide (HYDRODIURIL) 25 MG tablet Take 1 tablet (25 mg total) by mouth daily.  . metoprolol tartrate (LOPRESSOR) 25 MG tablet Take 1 tablet (25 mg total) by mouth 2 (two) times daily.  Marland Kitchen omeprazole (PRILOSEC) 20 MG capsule TAKE 1 CAPSULE 30 MINUTES BEFORE BREAKFAST AND AT BEDTIME.  . beclomethasone (QVAR) 80 MCG/ACT inhaler Use 2 puffs twice daily to prevent cough or wheeze.  Rinse, gargle, and spit after use. (Patient not taking: Reported on 12/25/2016)  . calcium-vitamin D (OSCAL WITH D) 500-200 MG-UNIT tablet Take 1 tablet by mouth 2 (two) times daily.  . fluticasone (FLOVENT HFA) 110 MCG/ACT inhaler Inhale 2 puffs into the lungs 2 (two) times daily.  Marland Kitchen HYDROcodone-acetaminophen (NORCO) 5-325 MG tablet Take 1 tablet by mouth every 6 (six) hours as needed for moderate pain. (Patient not taking: Reported on 01/15/2017)  . oxybutynin (DITROPAN XL) 5 MG 24 hr tablet  Take 1 tablet (5 mg total) by mouth at bedtime.  . [DISCONTINUED] oxybutynin (DITROPAN XL) 5 MG 24 hr tablet Take 1 tablet (5 mg total) by mouth at bedtime.   No facility-administered encounter medications on file as of 01/15/2017.     Subjective Patient is seen back today for follow-up from her visit on 12/25/2016 At that time she had a severe yeast vulvovaginitis She also has a significant cystocele and we are addressing that more directly today She once again is painted with gentian violet I cut she still has a bit of the that vulvovaginitis but it is much much better Also tried and vein to place several pessaries a Milex ring with support #23 and 4 all of which immediately came out Patient is also having a great deal of difficulty with urinary frequency getting up frequently doesn't night and losing urine during the day without any notice This smokeless consistent with urge incontinence  Objective As above Pessary fitting attempted without success  Pertinent ROS No burning with urination, frequency or urgency No nausea, vomiting or diarrhea Nor fever chills or other constitutional symptoms   Labs or studies No new    Impression Diagnoses this Encounter::   ICD-10-CM   1. Cystocele, midline N81.11   2. Vulvovaginitis due to yeast B37.3   3. Urge incontinence N39.41     Established relevant diagnosis(es):   Plan/Recommendations: Meds ordered this encounter  Medications  . DISCONTD: oxybutynin (DITROPAN XL) 5 MG 24 hr  tablet    Sig: Take 1 tablet (5 mg total) by mouth at bedtime.    Dispense:  90 tablet    Refill:  1  . oxybutynin (DITROPAN XL) 5 MG 24 hr tablet    Sig: Take 1 tablet (5 mg total) by mouth at bedtime.    Dispense:  90 tablet    Refill:  1    Labs or Scans Ordered: No orders of the defined types were placed in this encounter.   Management:: We'll give a course of Ditropan and see if that helps her urge symptoms and eventually her progress have  to address her cystocele surgically Pessary will not work in this patient as she has a foreshrotened vagina from her previous "bladder tack"  I tried a #2,#3, #4 and the all fit with #4 being the best side to sdie fit but it pushes out immediately with slight strain.  Therefore pessary is definitely not an option  Follow up Return in about 1 month (around 02/15/2017) for Follow up, with Dr Elonda Husky.      All questions were answered.  Past Medical History:  Diagnosis Date  . Acid reflux 2000   HA with nexium   . Anemia   . Angio-edema    caused by Lisinopril  . Arthritis   . Chest pain, unspecified 03/25/2014   MI ruled out. CTA negative PE  . Diabetes mellitus 2012   borderline diabetes, never on medicine   . Enteritis 04/23/2011  . Hypercholesteremia 2011  . Hypertension 1990s   . Ileitis 03/24/2014  . Moderate persistent asthma in adult without complication 0/24/0973  . Shortness of breath dyspnea   . Sleep apnea     Past Surgical History:  Procedure Laterality Date  . ABDOMINAL HYSTERECTOMY  1983   . CATARACT EXTRACTION W/PHACO Right 01/01/2015   Procedure: CATARACT EXTRACTION PHACO AND INTRAOCULAR LENS PLACEMENT (IOC);  Surgeon: Rutherford Guys, MD;  Location: [facility];  Service: Ophthalmology;  Laterality: Right;  CDE:5.11  . CATARACT EXTRACTION W/PHACO Left 01/15/2015   Procedure: CATARACT EXTRACTION PHACO AND INTRAOCULAR LENS PLACEMENT (IOC);  Surgeon: Rutherford Guys, MD;  Location: [facility];  Service: Ophthalmology;  Laterality: Left;  CDE:4.11  . COLONOSCOPY N/A 07/22/2015   Procedure: COLONOSCOPY;  Surgeon: Danie Binder, MD;  [facility];  Service: Endoscopy;  Laterality: N/A;  1245pm  . ESOPHAGOGASTRODUODENOSCOPY N/A 07/22/2015   Procedure: ESOPHAGOGASTRODUODENOSCOPY (EGD);  Surgeon: Danie Binder, MD;  [facility];  Service: Endoscopy;  Laterality: N/A;  . HEMORRHOID SURGERY N/A 03/16/2016   Procedure: EXTENSIVE HEMORRHOIDECTOMY;  Surgeon: Aviva Signs,  MD;  Location: [facility];  Service: General;  Laterality: N/A;  . [place]   . SAVORY DILATION N/A 07/22/2015   Procedure: SAVORY DILATION;  Surgeon: Danie Binder, MD;  [facility];  Service: Endoscopy;  Laterality: N/A;  . TONSILLECTOMY    . TUBAL LIGATION  1983     OB History    Gravida Para Term Preterm AB Living   3 3 3     2    SAB TAB Ectopic Multiple Live Births                    Social History   Social History  . Marital status: Divorced    Spouse name: N/A  . Number of children: 3   . Years of education: some colle   Occupational History  . Retired  Former CNA    Social History Main Topics  . Smoking status: Never Smoker  . Smokeless tobacco: Never Used  . Alcohol use No  . Drug use: No  . Sexual activity: Not Currently    Birth control/ protection: Surgical   Other Topics Concern  . None   Social History Narrative   3 children-2 living, sons. Lives alone. 7 grandchildren. 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     Family History  Problem Relation Age of Onset  . Diabetes Mother   . Heart disease Mother   . Stroke Mother   . Cancer Daughter 71       breast   . Allergic rhinitis Daughter   . Allergic rhinitis Son   . Seizures Son   . Diabetes Maternal Aunt   . Cancer Maternal Aunt 60       COLON CANCER  . Diabetes Maternal Uncle   . Asthma Brother   . Eczema Brother   . Asthma Grandchild   . Diabetes Sister   . Hypertension Sister   . Kidney disease Sister   . Emphysema Brother        smoked  . Angioedema Neg Hx   . Urticaria Neg Hx   . Immunodeficiency Neg Hx

## 2017-01-22 ENCOUNTER — Other Ambulatory Visit (HOSPITAL_COMMUNITY)
Admission: RE | Admit: 2017-01-22 | Discharge: 2017-01-22 | Disposition: A | Discharge status: Home / Self Care | Payer: Medicare Other | Source: Ambulatory Visit | Attending: Family Medicine | Admitting: Family Medicine

## 2017-01-22 ENCOUNTER — Other Ambulatory Visit (HOSPITAL_COMMUNITY): Payer: Self-pay | Admitting: Family Medicine

## 2017-01-22 DIAGNOSIS — E782 Mixed hyperlipidemia: Secondary | ICD-10-CM | POA: Diagnosis not present

## 2017-01-22 DIAGNOSIS — K210 Gastro-esophageal reflux disease with esophagitis: Secondary | ICD-10-CM | POA: Insufficient documentation

## 2017-01-22 DIAGNOSIS — B356 Tinea cruris: Secondary | ICD-10-CM | POA: Diagnosis not present

## 2017-01-22 DIAGNOSIS — I10 Essential (primary) hypertension: Secondary | ICD-10-CM | POA: Diagnosis present

## 2017-01-22 DIAGNOSIS — Z780 Asymptomatic menopausal state: Secondary | ICD-10-CM

## 2017-01-22 LAB — CBC WITH DIFFERENTIAL/PLATELET
Basophils Absolute: 0 10*3/uL (ref 0.0–0.1)
Basophils Relative: 0 %
Eosinophils Absolute: 0.2 10*3/uL (ref 0.0–0.7)
Eosinophils Relative: 3 %
HEMATOCRIT: 34.7 % — AB (ref 36.0–46.0)
HEMOGLOBIN: 11.3 g/dL — AB (ref 12.0–15.0)
LYMPHS ABS: 2.7 10*3/uL (ref 0.7–4.0)
LYMPHS PCT: 29 %
MCH: 26.7 pg (ref 26.0–34.0)
MCHC: 32.6 g/dL (ref 30.0–36.0)
MCV: 82 fL (ref 78.0–100.0)
MONOS PCT: 6 %
Monocytes Absolute: 0.5 10*3/uL (ref 0.1–1.0)
Neutro Abs: 5.6 10*3/uL (ref 1.7–7.7)
Neutrophils Relative %: 62 %
Platelets: 284 10*3/uL (ref 150–400)
RBC: 4.23 MIL/uL (ref 3.87–5.11)
RDW: 14.6 % (ref 11.5–15.5)
WBC: 9.1 10*3/uL (ref 4.0–10.5)

## 2017-01-22 LAB — COMPREHENSIVE METABOLIC PANEL
ALBUMIN: 3.8 g/dL (ref 3.5–5.0)
ALK PHOS: 112 U/L (ref 38–126)
ALT: 12 U/L — ABNORMAL LOW (ref 14–54)
ANION GAP: 8 (ref 5–15)
AST: 17 U/L (ref 15–41)
BUN: 19 mg/dL (ref 6–20)
CALCIUM: 9.2 mg/dL (ref 8.9–10.3)
CHLORIDE: 100 mmol/L — AB (ref 101–111)
CO2: 30 mmol/L (ref 22–32)
Creatinine, Ser: 0.98 mg/dL (ref 0.44–1.00)
GFR calc non Af Amer: 58 mL/min — ABNORMAL LOW (ref >60)
GLUCOSE: 95 mg/dL (ref 65–99)
POTASSIUM: 3.5 mmol/L (ref 3.5–5.1)
Sodium: 138 mmol/L (ref 135–145)
Total Bilirubin: 0.5 mg/dL (ref 0.3–1.2)
Total Protein: 8.1 g/dL (ref 6.5–8.1)

## 2017-01-22 LAB — LIPID PANEL
CHOL/HDL RATIO: 3 ratio
Cholesterol: 115 mg/dL (ref 0–200)
HDL: 38 mg/dL — AB (ref >40)
LDL CALC: 58 mg/dL (ref 0–99)
TRIGLYCERIDES: 97 mg/dL (ref <150)
VLDL: 19 mg/dL (ref 0–40)

## 2017-01-23 LAB — HEMOGLOBIN A1C
Hgb A1c MFr Bld: 6.1 % — ABNORMAL HIGH (ref 4.8–5.6)
MEAN PLASMA GLUCOSE: 128 mg/dL

## 2017-01-24 LAB — MICROALBUMIN, URINE: MICROALB UR: 3.6 ug/mL — AB

## 2017-02-05 ENCOUNTER — Ambulatory Visit (HOSPITAL_COMMUNITY)
Admission: RE | Admit: 2017-02-05 | Discharge: 2017-02-05 | Disposition: A | Discharge status: Home / Self Care | Payer: Medicare Other | Source: Ambulatory Visit | Attending: Family Medicine | Admitting: Family Medicine

## 2017-02-05 DIAGNOSIS — Z13820 Encounter for screening for osteoporosis: Secondary | ICD-10-CM | POA: Diagnosis not present

## 2017-02-05 DIAGNOSIS — Z780 Asymptomatic menopausal state: Secondary | ICD-10-CM | POA: Insufficient documentation

## 2017-02-16 ENCOUNTER — Ambulatory Visit (INDEPENDENT_AMBULATORY_CARE_PROVIDER_SITE_OTHER): Payer: Medicare Other | Admitting: Obstetrics & Gynecology

## 2017-02-16 ENCOUNTER — Encounter: Payer: Self-pay | Admitting: Obstetrics & Gynecology

## 2017-02-16 VITALS — BP 100/70 | HR 72 | Wt 229.4 lb

## 2017-02-16 DIAGNOSIS — N3941 Urge incontinence: Secondary | ICD-10-CM | POA: Diagnosis not present

## 2017-02-16 DIAGNOSIS — N8111 Cystocele, midline: Secondary | ICD-10-CM

## 2017-02-16 MED ORDER — OXYBUTYNIN CHLORIDE ER 5 MG PO TB24: 5.0000 mg | ORAL_TABLET | Freq: Every day | ORAL | 3 refills | Status: DC

## 2017-02-16 NOTE — Addendum Note (Signed)
Addended by: Florian Buff on: 02/16/2017 12:11 PM   Modules accepted: Orders

## 2017-02-16 NOTE — Progress Notes (Signed)
Chief Complaint  Patient presents with  . Follow-up    cystocele     Blood pressure 100/70, pulse 72, weight 229 lb 6.4 oz (104.1 kg).  66 y.o. [id number] No LMP recorded. Patient has had a hysterectomy. The current method of family planning is status post hysterectomy.  Outpatient Encounter Prescriptions as of 02/16/2017  Medication Sig  . aspirin EC 81 MG tablet Take 81 mg by mouth daily.  Marland Kitchen atorvastatin (LIPITOR) 10 MG tablet Take 1 tablet (10 mg total) by mouth daily.  . calcium-vitamin D (OSCAL WITH D) 500-200 MG-UNIT tablet Take 1 tablet by mouth 2 (two) times daily.  . fluticasone (FLONASE) 50 MCG/ACT nasal spray Place into both nostrils daily.  . hydrochlorothiazide (HYDRODIURIL) 25 MG tablet Take 1 tablet (25 mg total) by mouth daily.  . metoprolol tartrate (LOPRESSOR) 25 MG tablet Take 1 tablet (25 mg total) by mouth 2 (two) times daily.  Marland Kitchen omeprazole (PRILOSEC) 20 MG capsule TAKE 1 CAPSULE 30 MINUTES BEFORE BREAKFAST AND AT BEDTIME.  Marland Kitchen oxybutynin (DITROPAN XL) 5 MG 24 hr tablet Take 1 tablet (5 mg total) by mouth at bedtime.  Marland Kitchen albuterol (PROVENTIL HFA;VENTOLIN HFA) 108 (90 BASE) MCG/ACT inhaler Inhale 1-2 puffs into the lungs every 6 (six) hours as needed for wheezing or shortness of breath. (Patient not taking: Reported on 02/16/2017)  . albuterol (PROVENTIL) (2.5 MG/3ML) 0.083% nebulizer solution Take 2.5 mg by nebulization every 6 (six) hours as needed for wheezing or shortness of breath.  . beclomethasone (QVAR) 80 MCG/ACT inhaler Use 2 puffs twice daily to prevent cough or wheeze.  Rinse, gargle, and spit after use. (Patient not taking: Reported on 12/25/2016)  . diltiazem (DILACOR XR) 180 MG 24 hr capsule Take 180 mg by mouth daily.  . fluconazole (DIFLUCAN) 100 MG tablet Take 1 tablet (100 mg total) by mouth daily. (Patient not taking: Reported on 02/16/2017)  . fluticasone (FLOVENT HFA) 110 MCG/ACT inhaler Inhale 2 puffs into the lungs 2 (two) times daily.  Marland Kitchen  HYDROcodone-acetaminophen (NORCO) 5-325 MG tablet Take 1 tablet by mouth every 6 (six) hours as needed for moderate pain. (Patient not taking: Reported on 01/15/2017)   No facility-administered encounter medications on file as of 02/16/2017.     Subjective This visit as a follow-up from 01/15/2017 which is a follow-up from 12/25/2016 Originally Ms. Plair had a severe vulvovaginitis which we cleared up without difficulty using gentian violet and fluconazole She also has a significant cystocele and she is status post a repair in her 83s for that Additionally she had a great deal of difficulty with urinary frequency getting up frequently and used losing urine during the day without notice sometimes large volumes I then placed around the Ditropan 5 mg XL at night and she is here today to see how her symptoms are doing  She states she is between somewhat and significantly improved and feels like the medicine is improving her symptoms as well as her frequency and making her pressure from cystocele less noticeable     Objective   Pertinent ROS No burning with urination, frequency or urgency No nausea, vomiting or diarrhea Nor fever chills or other constitutional symptoms   Labs or studies No new labs or studies    Impression Diagnoses this Encounter::   ICD-10-CM   1. Cystocele, midline N81.11   2. Urge incontinence N39.41     Established relevant diagnosis(es):   Plan/Recommendations: No orders of the defined types were placed in this  encounter.   Labs or Scans Ordered: No orders of the defined types were placed in this encounter.   Management:: Discussed options of management with the [place] She states that she is doing "somewhat "better since she has started taking the Ditropan which she describes is 50-75% better As a result since improving her urge symptoms her overall bladder complaints are improved Please refer to my note on 01/15/2017 which showed that she  could not be effectively fit for a pessary despite my best efforts because of her previous history of bladder repair  So at this point she wants to hold off any surgery and I have left the ball in her Court to recontact me regarding care and management otherwise I will see her back in 6 months for routine follow-up  Follow up Return in about 6 months (around 08/19/2017) for Follow up, with Dr Elonda Husky.        Face to face time:  15 minutes  Greater than 50% of the visit time was spent in counseling and coordination of care with the patient.  The summary and outline of the counseling and care coordination is summarized in the note above.   All questions were answered.  Past Medical History:  Diagnosis Date  . Acid reflux 2000   HA with nexium   . Anemia   . Angio-edema    caused by Lisinopril  . Arthritis   . Chest pain, unspecified 03/25/2014   MI ruled out. CTA negative PE  . Diabetes mellitus 2012   borderline diabetes, never on medicine   . Enteritis 04/23/2011  . Hypercholesteremia 2011  . Hypertension 1990s   . Ileitis 03/24/2014  . Moderate persistent asthma in adult without complication 09/16/9700  . Shortness of breath dyspnea   . Sleep apnea     Past Surgical History:  Procedure Laterality Date  . ABDOMINAL HYSTERECTOMY  1983   . CATARACT EXTRACTION W/PHACO Right 01/01/2015   Procedure: CATARACT EXTRACTION PHACO AND INTRAOCULAR LENS PLACEMENT (IOC);  Surgeon: Rutherford Guys, MD;  Location: [facility];  Service: Ophthalmology;  Laterality: Right;  CDE:5.11  . CATARACT EXTRACTION W/PHACO Left 01/15/2015   Procedure: CATARACT EXTRACTION PHACO AND INTRAOCULAR LENS PLACEMENT (IOC);  Surgeon: Rutherford Guys, MD;  Location: [facility];  Service: Ophthalmology;  Laterality: Left;  CDE:4.11  . COLONOSCOPY N/A 07/22/2015   Procedure: COLONOSCOPY;  Surgeon: Danie Binder, MD;  Location: [facility];  Service: Endoscopy;  Laterality: N/A;  1245pm  . ESOPHAGOGASTRODUODENOSCOPY N/A 07/22/2015     Procedure: ESOPHAGOGASTRODUODENOSCOPY (EGD);  Surgeon: Danie Binder, MD;  Location: [facility];  Service: Endoscopy;  Laterality: N/A;  . HEMORRHOID SURGERY N/A 03/16/2016   Procedure: EXTENSIVE HEMORRHOIDECTOMY;  Surgeon: Aviva Signs, MD;  Location: [facility];  Service: General;  Laterality: N/A;  . [place]   . SAVORY DILATION N/A 07/22/2015   Procedure: SAVORY DILATION;  Surgeon: Danie Binder, MD;  Location: [facility];  Service: Endoscopy;  Laterality: N/A;  . TONSILLECTOMY    . TUBAL LIGATION  1983     OB History    Gravida Para Term Preterm AB Living   3 3 3     2    SAB TAB Ectopic Multiple Live Births                     Social History   Social History  . Marital status: Divorced    Spouse name: N/A  . Number of children: 3   .  Years of education: some colle   Occupational History  . Retired      Former Quarry manager    Social History Main Topics  . Smoking status: Never Smoker  . Smokeless tobacco: Never Used  . Alcohol use No  . Drug use: No  . Sexual activity: Not Currently    Birth control/ protection: Surgical   Other Topics Concern  . None   Social History Narrative   3 children-2 living, sons. Lives alone. 7 grandchildren. 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     Family History  Problem Relation Age of Onset  . Diabetes Mother   . Heart disease Mother   . Stroke Mother   . Cancer Daughter 44       breast   . Allergic rhinitis Daughter   . Allergic rhinitis Son   . Seizures Son   . Diabetes Maternal Aunt   . Cancer Maternal Aunt 60       COLON CANCER  . Diabetes Maternal Uncle   . Asthma Brother   . Eczema Brother   . Asthma Grandchild   . Diabetes Sister   . Hypertension Sister   . Kidney disease Sister   . Emphysema Brother        smoked  . Angioedema Neg Hx   . Urticaria Neg Hx   . Immunodeficiency Neg Hx

## 2017-03-04 ENCOUNTER — Other Ambulatory Visit (HOSPITAL_COMMUNITY): Payer: Self-pay | Admitting: Family Medicine

## 2017-03-04 DIAGNOSIS — Z1231 Encounter for screening mammogram for malignant neoplasm of breast: Secondary | ICD-10-CM

## 2017-04-22 ENCOUNTER — Ambulatory Visit (HOSPITAL_COMMUNITY)
Admission: RE | Admit: 2017-04-22 | Discharge: 2017-04-22 | Disposition: A | Discharge status: Home / Self Care | Payer: Medicare Other | Source: Ambulatory Visit | Attending: Family Medicine | Admitting: Family Medicine

## 2017-04-22 DIAGNOSIS — Z1231 Encounter for screening mammogram for malignant neoplasm of breast: Secondary | ICD-10-CM | POA: Diagnosis not present

## 2017-05-15 ENCOUNTER — Other Ambulatory Visit: Payer: Self-pay | Admitting: Allergy & Immunology

## 2017-08-24 ENCOUNTER — Ambulatory Visit: Payer: Medicare Other | Admitting: Obstetrics & Gynecology

## 2017-08-24 ENCOUNTER — Other Ambulatory Visit: Payer: Self-pay | Admitting: Nurse Practitioner

## 2017-08-25 NOTE — Telephone Encounter (Signed)
Patient needs ov nonurgent for further refills.

## 2017-09-02 NOTE — Telephone Encounter (Signed)
Pt is aware. Forwarding to [place] to schedule.

## 2017-09-03 ENCOUNTER — Encounter: Payer: Self-pay | Admitting: Gastroenterology

## 2017-09-03 NOTE — Telephone Encounter (Signed)
APPT MADE AND LETTER SENT  °

## 2017-10-19 IMAGING — CR DG CHEST 1V PORT
1 series · 1 of 1 positions shown · non-contrast
Comparison: 03/24/2014 chest radiograph.

CLINICAL DATA: Shortness of breath

EXAM:
PORTABLE CHEST 1 VIEW

[ap portable]
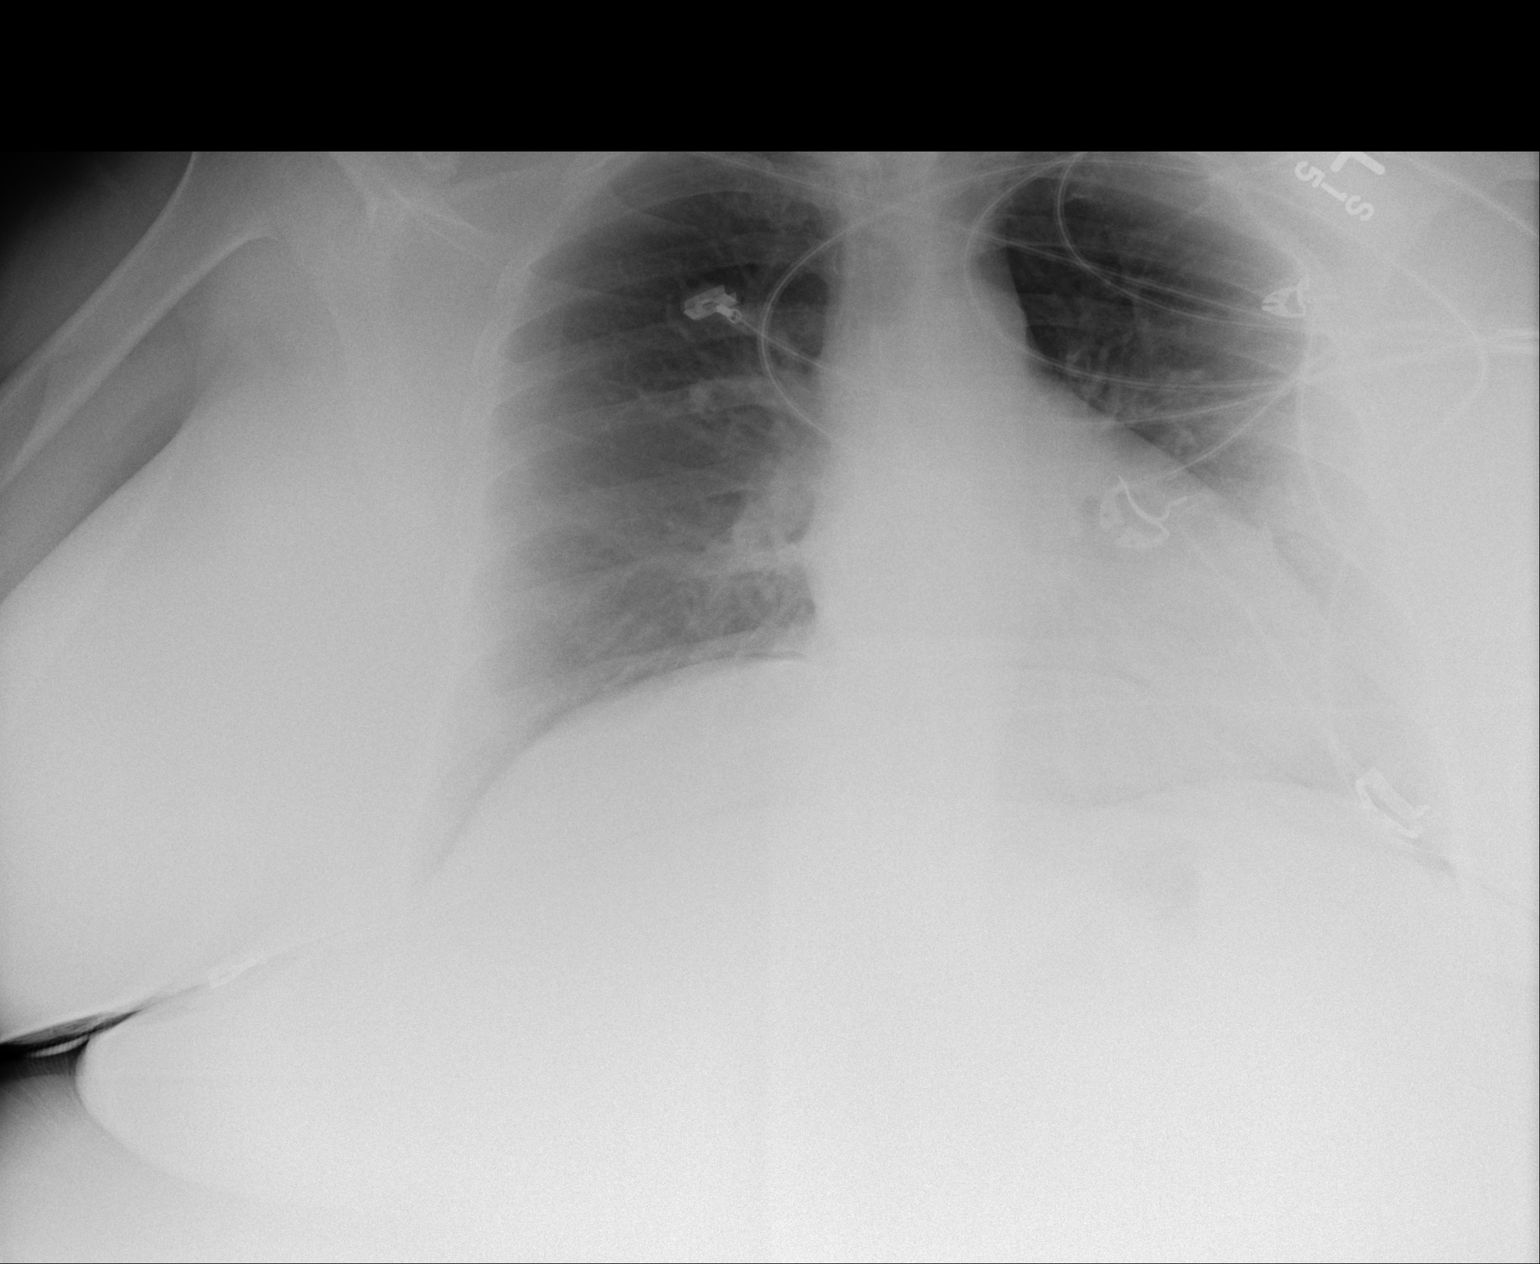

[1 of 1 positions shown; findings below may reference images not displayed]

FINDINGS: Stable cardiomediastinal silhouette with top-normal heart size. No
pneumothorax. No pleural effusion. Clear lungs, with no focal lung
consolidation and no pulmonary edema.
IMPRESSION: No active disease.

## 2017-10-27 ENCOUNTER — Ambulatory Visit: Payer: Medicare Other | Admitting: Gastroenterology

## 2017-10-29 ENCOUNTER — Other Ambulatory Visit: Payer: Self-pay | Admitting: Gastroenterology

## 2017-11-01 NOTE — Telephone Encounter (Signed)
Pt is aware and appt is scheduled for 01/04/2018 at 1:30 pm with Roseanne Kaufman, NP.

## 2017-11-01 NOTE — Telephone Encounter (Signed)
Patient will need to be seen for refills, she cancelled her appt last week. I have given 3 month supply but no further refills after that without OV as it has been 11/2015 since last seen.

## 2018-01-04 ENCOUNTER — Ambulatory Visit (INDEPENDENT_AMBULATORY_CARE_PROVIDER_SITE_OTHER): Payer: Medicare Other | Admitting: Gastroenterology

## 2018-01-04 ENCOUNTER — Encounter: Payer: Self-pay | Admitting: Gastroenterology

## 2018-01-04 VITALS — BP 154/77 | HR 93 | Temp 97.7°F | Ht 63.0 in | Wt 242.4 lb

## 2018-01-04 DIAGNOSIS — R1310 Dysphagia, unspecified: Secondary | ICD-10-CM

## 2018-01-04 DIAGNOSIS — K219 Gastro-esophageal reflux disease without esophagitis: Secondary | ICD-10-CM | POA: Diagnosis not present

## 2018-01-04 MED ORDER — OMEPRAZOLE 20 MG PO CPDR: 20.0000 mg | DELAYED_RELEASE_CAPSULE | Freq: Two times a day (BID) | ORAL | 3 refills | Status: DC

## 2018-01-04 NOTE — Assessment & Plan Note (Signed)
EGD in 2017 s/p empiric dilation. Reports improvement after this. No prior BPE. Pursue BPE. Query underlying motility disorder.

## 2018-01-04 NOTE — Assessment & Plan Note (Signed)
Prilosec BID but notes she takes this sometimes TID. Will trial Dexilant. Exacerbation due to dietary behaviors most likely. GERD diet provided. 3 month follow-up.

## 2018-01-04 NOTE — Patient Instructions (Addendum)
I would like for you to stop the Prilosec for now and try Dexilant once daily. Let me know how this works! If it does well, we can send this to pharmacy. If not, we will go back to Prilosec twice a day.   I have also ordered an xray of your esophagus. If there is any narrowing, stricture, we can pursue an upper endoscopy.  We will see you in 3 months!  It was a pleasure to see you today. I strive to create trusting relationships with patients to provide genuine, compassionate, and quality care. I value your feedback. If you receive a survey regarding your visit,  I greatly appreciate you taking time to fill this out.   Annitta Needs, PhD, ANP-BC [facility]    Food Choices for Gastroesophageal Reflux Disease, Adult When you have gastroesophageal reflux disease (GERD), the foods you eat and your eating habits are very important. Choosing the right foods can help ease the discomfort of GERD. Consider working with a diet and nutrition specialist (dietitian) to help you make healthy food choices. What general guidelines should I follow? Eating plan  Choose healthy foods low in fat, such as fruits, vegetables, whole grains, low-fat dairy products, and lean meat, fish, and poultry.  Eat frequent, small meals instead of three large meals each day. Eat your meals slowly, in a relaxed setting. Avoid bending over or lying down until 2-3 hours after eating.  Limit high-fat foods such as fatty meats or fried foods.  Limit your intake of oils, butter, and shortening to less than 8 teaspoons each day.  Avoid the following: ? Foods that cause symptoms. These may be different for different people. Keep a food diary to keep track of foods that cause symptoms. ? Alcohol. ? Drinking large amounts of liquid with meals. ? Eating meals during the 2-3 hours before bed.  Cook foods using methods other than frying. This may include baking, grilling, or broiling. Lifestyle   Maintain a  healthy weight. Ask your health care provider what weight is healthy for you. If you need to lose weight, work with your health care provider to do so safely.  Exercise for at least 30 minutes on 5 or more days each week, or as told by your health care provider.  Avoid wearing clothes that fit tightly around your waist and chest.  Do not use any products that contain nicotine or tobacco, such as cigarettes and e-cigarettes. If you need help quitting, ask your health care provider.  Sleep with the head of your bed raised. Use a wedge under the mattress or blocks under the bed frame to raise the head of the bed. What foods are not recommended? The items listed may not be a complete list. Talk with your dietitian about what dietary choices are best for you. Grains Pastries or quick breads with added fat. [place] toast. Vegetables Deep fried vegetables. [place] fries. Any vegetables prepared with added fat. Any vegetables that cause symptoms. For some people this may include tomatoes and tomato products, chili peppers, onions and garlic, and horseradish. Fruits Any fruits prepared with added fat. Any fruits that cause symptoms. For some people this may include citrus fruits, such as oranges, grapefruit, pineapple, and lemons. Meats and other protein foods High-fat meats, such as fatty beef or pork, hot dogs, ribs, ham, sausage, salami and bacon. Fried meat or protein, including fried fish and fried chicken. Nuts and nut butters. Dairy Whole milk and chocolate milk. Sour cream. Cream. Ice  cream. Cream cheese. Milk shakes. Beverages Coffee and tea, with or without caffeine. Carbonated beverages. Sodas. Energy drinks. Fruit juice made with acidic fruits (such as orange or grapefruit). Tomato juice. Alcoholic drinks. Fats and oils Butter. Margarine. Shortening. Ghee. Sweets and desserts Chocolate and cocoa. Donuts. Seasoning and other foods Pepper. Peppermint and spearmint. Any condiments, herbs,  or seasonings that cause symptoms. For some people, this may include curry, hot sauce, or vinegar-based salad dressings. Summary  When you have gastroesophageal reflux disease (GERD), food and lifestyle choices are very important to help ease the discomfort of GERD.  Eat frequent, small meals instead of three large meals each day. Eat your meals slowly, in a relaxed setting. Avoid bending over or lying down until 2-3 hours after eating.  Limit high-fat foods such as fatty meat or fried foods. This information is not intended to replace advice given to you by your health care provider. Make sure you discuss any questions you have with your health care provider. Document Released: 06/22/2005 Document Revised: 06/23/2016 Document Reviewed: 06/23/2016 Elsevier Interactive Patient Education  [employer].

## 2018-01-04 NOTE — Progress Notes (Signed)
cc'd to pcp 

## 2018-01-04 NOTE — Progress Notes (Signed)
Referring Provider: [name], MD Primary Care Physician:  [name], MD Primary GI: Dr. Oneida Alar   Chief Complaint  Patient presents with  . Gastroesophageal Reflux    needs refill    HPI:   Sandra [name] is a 67 y.o. female presenting today with a history of GERD, last seen in 2017. Surveillance colonoscopy due in 2020. Last EGD in 2017 with gastritis, possible web. Empiric dilation completed. Here for refills.   Acid flares up with tomatoes. Fruit causes flares. Prilosec BID. Sometimes three times a day. Sometimes forgets to take on an empty stomach. When eating, feels like food is stuck in esophagus at times, feels like on left side of chest. Gets strangled a lot. States she did well after last dilation but now returned.   Past Medical History:  Diagnosis Date  . Acid reflux 2000   HA with nexium   . Anemia   . Angio-edema    caused by Lisinopril  . Arthritis   . Chest pain, unspecified 03/25/2014   MI ruled out. CTA negative PE  . Diabetes mellitus 2012   borderline diabetes, never on medicine   . Enteritis 04/23/2011  . Hypercholesteremia 2011  . Hypertension 1990s   . Ileitis 03/24/2014  . Moderate persistent asthma in adult without complication 10/12/8117  . Shortness of breath dyspnea   . Sleep apnea     Past Surgical History:  Procedure Laterality Date  . ABDOMINAL HYSTERECTOMY  1983   . CATARACT EXTRACTION W/PHACO Right 01/01/2015   Procedure: CATARACT EXTRACTION PHACO AND INTRAOCULAR LENS PLACEMENT (IOC);  Surgeon: Rutherford Guys, MD;  Location: [facility];  Service: Ophthalmology;  Laterality: Right;  CDE:5.11  . CATARACT EXTRACTION W/PHACO Left 01/15/2015   Procedure: CATARACT EXTRACTION PHACO AND INTRAOCULAR LENS PLACEMENT (IOC);  Surgeon: Rutherford Guys, MD;  Location: [facility];  Service: Ophthalmology;  Laterality: Left;  CDE:4.11  . COLONOSCOPY N/A 07/22/2015   Dr. Oneida Alar: simple adenomas, left colon redundant, internal hemorrhoids, surveillance  2020  . ESOPHAGOGASTRODUODENOSCOPY N/A 07/22/2015   mild non-erosive gastritis, dysphagia due to web and/or uncontrolled GERD, empiric dilation  . HEMORRHOID SURGERY N/A 03/16/2016   Procedure: EXTENSIVE HEMORRHOIDECTOMY;  Surgeon: Aviva Signs, MD;  Location: [facility];  Service: General;  Laterality: N/A;  . [place]   . SAVORY DILATION N/A 07/22/2015   Procedure: SAVORY DILATION;  Surgeon: Danie Binder, MD;  [facility];  Service: Endoscopy;  Laterality: N/A;  . TONSILLECTOMY    . TUBAL LIGATION  1983     Current Outpatient Medications  Medication Sig Dispense Refill  . albuterol (PROVENTIL HFA;VENTOLIN HFA) 108 (90 BASE) MCG/ACT inhaler Inhale 1-2 puffs into the lungs every 6 (six) hours as needed for wheezing or shortness of breath. 1 Inhaler 0  . albuterol (PROVENTIL) (2.5 MG/3ML) 0.083% nebulizer solution Take 2.5 mg by nebulization every 6 (six) hours as needed for wheezing or shortness of breath.    Marland Kitchen aspirin EC 81 MG tablet Take 81 mg by mouth daily.    Marland Kitchen atorvastatin (LIPITOR) 10 MG tablet Take 1 tablet (10 mg total) by mouth daily. 90 tablet 0  . beclomethasone (QVAR) 80 MCG/ACT inhaler Use 2 puffs twice daily to prevent cough or wheeze.  Rinse, gargle, and spit after use. (Patient taking differently: as needed. Use 2 puffs twice daily to prevent cough or wheeze.  Rinse, gargle, and spit after use.) 1 Inhaler 5  . diltiazem (DILACOR XR) 180 MG 24 hr capsule  Take 180 mg by mouth daily.    . fluticasone (FLONASE) 50 MCG/ACT nasal spray Place into both nostrils as needed.     . hydrochlorothiazide (HYDRODIURIL) 25 MG tablet Take 1 tablet (25 mg total) by mouth daily. 30 tablet 2  . metoprolol succinate (TOPROL-XL) 50 MG 24 hr tablet Take 1 tablet by mouth daily.    Marland Kitchen omeprazole (PRILOSEC) 20 MG capsule TAKE 1 CAPSULE 30 MINUTES BEFORE BREAKFAST AND AT BEDTIME. 60 capsule 2  . oxybutynin (DITROPAN XL) 5 MG 24 hr tablet Take 1 tablet (5 mg total) by mouth at  bedtime. 90 tablet 3  . calcium-vitamin D (OSCAL WITH D) 500-200 MG-UNIT tablet Take 1 tablet by mouth 2 (two) times daily.    . fluconazole (DIFLUCAN) 100 MG tablet Take 1 tablet (100 mg total) by mouth daily. (Patient not taking: Reported on 02/16/2017) 14 tablet 0  . fluticasone (FLOVENT HFA) 110 MCG/ACT inhaler Inhale 2 puffs into the lungs 2 (two) times daily. 1 Inhaler 1  . HYDROcodone-acetaminophen (NORCO) 5-325 MG tablet Take 1 tablet by mouth every 6 (six) hours as needed for moderate pain. (Patient not taking: Reported on 01/15/2017) 30 tablet 0  . metoprolol tartrate (LOPRESSOR) 25 MG tablet Take 1 tablet (25 mg total) by mouth 2 (two) times daily. (Patient not taking: Reported on 01/04/2018) 60 tablet 2   No current facility-administered medications for this visit.     Allergies as of 01/04/2018 - Review Complete 01/04/2018  Allergen Reaction Noted  . Ciprofloxacin Itching 11/28/2015  . Latex Itching 01/01/2015  . Lisinopril Swelling 03/24/2014  . Penicillins Itching 04/23/2011  . Protonix [pantoprazole sodium]  08/08/2015  . Sulfa antibiotics  03/24/2014    Family History  Problem Relation Age of Onset  . Diabetes Mother   . Heart disease Mother   . Stroke Mother   . Cancer Daughter 67       breast   . Allergic rhinitis Daughter   . Allergic rhinitis Son   . Seizures Son   . Diabetes Maternal Aunt   . Cancer Maternal Aunt 60       COLON CANCER  . Diabetes Maternal Uncle   . Asthma Brother   . Eczema Brother   . Asthma Grandchild   . Diabetes Sister   . Hypertension Sister   . Kidney disease Sister   . Emphysema Brother        smoked  . Angioedema Neg Hx   . Urticaria Neg Hx   . Immunodeficiency Neg Hx     Social History   Socioeconomic History  . Marital status: Divorced    Spouse name: Not on file  . Number of children: 3   . Years of education: some colle  . Highest education level: Not on file  Occupational History  . 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 Financial resource strain: Not on file  . Food insecurity:    Worry: Not on file    Inability: Not on file  . Transportation needs:    Medical: Not on file    Non-medical: Not on file  Tobacco Use  . Smoking status: Never Smoker  . Smokeless tobacco: Never Used  Substance and Sexual Activity  . Alcohol use: No  . Drug use: No  . Sexual activity: Not Currently    Birth control/protection: Surgical  Lifestyle  . Physical activity:    Days per week: Not on file  Minutes per session: Not on file  . Stress: Not on file  Relationships  . Social connections:    Talks on phone: Not on file    Gets together: Not on file    Attends religious service: Not on file    Active member of club or organization: Not on file    Attends meetings of clubs or organizations: Not on file    Relationship status: Not on file  Other Topics Concern  . Not on file  Social History Narrative   3 children-2 living, sons. Lives alone. 7 grandchildren. 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     Review of Systems: Gen: Denies fever, chills, anorexia. Denies fatigue, weakness, weight loss.  CV: Denies chest pain, palpitations, syncope, peripheral edema, and claudication. Resp: Denies dyspnea at rest, cough, wheezing, coughing up blood, and pleurisy. GI: see HPI  Derm: Denies rash, itching, dry skin Psych: Denies depression, anxiety, memory loss, confusion. No homicidal or suicidal ideation.  Heme: Denies bruising, bleeding, and enlarged lymph nodes.  Physical Exam: BP (!) 154/77   Pulse 93   Temp 97.7 F (36.5 C) (Oral)   Ht 5\' 3"  (1.6 m)   Wt 242 lb 6.4 oz (110 kg)   BMI 42.94 kg/m  General:   Alert and oriented. No distress noted. Pleasant and cooperative.  Head:  Normocephalic and atraumatic. Eyes:  Conjuctiva clear without scleral icterus. Mouth:  Oral mucosa pink and moist.  Abdomen:  +BS, soft, non-tender and non-distended. No  rebound or guarding. No HSM or masses noted. Msk:  Symmetrical without gross deformities. Normal posture. Extremities:  Without edema. Neurologic:  Alert and  oriented x4 Psych:  Alert and cooperative. Normal mood and affect.

## 2018-01-10 ENCOUNTER — Ambulatory Visit (HOSPITAL_COMMUNITY)
Admission: RE | Admit: 2018-01-10 | Discharge: 2018-01-10 | Disposition: A | Discharge status: Home / Self Care | Payer: Medicare Other | Source: Ambulatory Visit | Attending: Gastroenterology | Admitting: Gastroenterology

## 2018-01-10 DIAGNOSIS — R1310 Dysphagia, unspecified: Secondary | ICD-10-CM | POA: Diagnosis present

## 2018-01-10 DIAGNOSIS — K449 Diaphragmatic hernia without obstruction or gangrene: Secondary | ICD-10-CM | POA: Diagnosis not present

## 2018-01-11 NOTE — Progress Notes (Signed)
No stricture noted. No motility issues. How is she doing with Dexilant?

## 2018-01-12 ENCOUNTER — Other Ambulatory Visit: Payer: Self-pay | Admitting: Obstetrics & Gynecology

## 2018-01-12 NOTE — Progress Notes (Signed)
Pt said the Dexilant helped some, but she is concerned about all of the side effects. She said she only had samples of that and she just feels like she will go back on the Prilosec bid. She is too worried about being on the [place].

## 2018-01-20 NOTE — Progress Notes (Signed)
Noted. Side effects similar to any PPI. If Dexilant worked, would recommend prescription for that.

## 2018-01-20 NOTE — Progress Notes (Signed)
PT is aware of recommendation, but she said she is just going to do the Prilosec bid for now.

## 2018-02-28 ENCOUNTER — Other Ambulatory Visit (HOSPITAL_COMMUNITY): Payer: Self-pay | Admitting: Family Medicine

## 2018-02-28 DIAGNOSIS — Z1231 Encounter for screening mammogram for malignant neoplasm of breast: Secondary | ICD-10-CM

## 2018-03-14 ENCOUNTER — Ambulatory Visit (INDEPENDENT_AMBULATORY_CARE_PROVIDER_SITE_OTHER): Payer: Medicare Other | Admitting: Adult Health

## 2018-03-14 ENCOUNTER — Encounter: Payer: Self-pay | Admitting: Adult Health

## 2018-03-14 ENCOUNTER — Encounter (INDEPENDENT_AMBULATORY_CARE_PROVIDER_SITE_OTHER): Payer: Self-pay

## 2018-03-14 VITALS — BP 129/71 | HR 81 | Ht 63.0 in | Wt 241.0 lb

## 2018-03-14 DIAGNOSIS — B373 Candidiasis of vulva and vagina: Secondary | ICD-10-CM | POA: Diagnosis not present

## 2018-03-14 DIAGNOSIS — B3731 Acute candidiasis of vulva and vagina: Secondary | ICD-10-CM

## 2018-03-14 DIAGNOSIS — N8111 Cystocele, midline: Secondary | ICD-10-CM | POA: Diagnosis not present

## 2018-03-14 DIAGNOSIS — R102 Pelvic and perineal pain unspecified side: Secondary | ICD-10-CM | POA: Insufficient documentation

## 2018-03-14 LAB — POCT URINALYSIS DIPSTICK
Blood, UA: NEGATIVE
Glucose, UA: NEGATIVE
Leukocytes, UA: NEGATIVE
NITRITE UA: NEGATIVE
PROTEIN UA: POSITIVE — AB

## 2018-03-14 MED ORDER — FLUCONAZOLE 100 MG PO TABS: ORAL_TABLET | ORAL | 0 refills | Status: DC

## 2018-03-14 NOTE — Addendum Note (Signed)
Addended by: Derrek Monaco A on: 03/14/2018 03:20 PM   Modules accepted: Orders

## 2018-03-14 NOTE — Progress Notes (Addendum)
  Subjective:     Patient ID: Sandra [name], female   DOB: 1951-02-21, 67 y.o.   MRN: [id number]  HPI Sandra [name] is a 67 year old black female in complaining o +pressure, low back discomfort and urinary frequency.And ?yeast back, but was treated with 3 diflucan without relief. She is sp hysterectomy, and has known cystocele.She had bladder tack, years ago and could not keep pessary in.  PCP is Dr Karie Kirks.   Review of Systems +pressure  Low back discomfort ?yeast back, has rash in groin area Urinary frequency Reviewed past medical,surgical, social and family history. Reviewed medications and allergies.     Objective:   Physical Exam BP 129/71 (BP Location: Left Arm, Patient Position: Sitting, Cuff Size: Normal)   Pulse 81   Ht 5\' 3"  (1.6 m)   Wt 241 lb (109.3 kg)   BMI 42.69 kg/m   Urine dipstick trace protein. Skin warm and dry.Pelvic: external genitalia is normal in appearance, has dry scaling near introitus, painted with gentian violet, vagina: pale pink with loss of moisture and rugae, +cycstocele to introitus, when bares down,urethra has no lesions or masses noted, cervix and uterus absent, adnexa: no masses or tenderness noted. Bladder is non tender and no masses felt. But difficult secondary to abdominal girth.  Will treat with diflucan 100 mg 1 daily for 10 days and get her appt with Dr Glo Herring to discuss cystocele repair as option, and need to keep sugar under control. Examination chaperoned by Diona Fanti, CMA.    Assessment:     1. Pelvic pressure in female   2. Cystocele, midline   3. Vulvovaginitis due to yeast       Plan:     Meds ordered this encounter  Medications  . fluconazole (DIFLUCAN) 100 MG tablet    Sig: Take 1 daily for 10 days    Dispense:  10 tablet    Refill:  0    Order Specific Question:   Supervising Provider    Answer:   Florian Buff [[id number]]  Return in 1 week to see Dr Glo Herring about cystocele repair

## 2018-03-21 ENCOUNTER — Ambulatory Visit (INDEPENDENT_AMBULATORY_CARE_PROVIDER_SITE_OTHER): Payer: Medicare Other | Admitting: Obstetrics and Gynecology

## 2018-03-21 ENCOUNTER — Encounter: Payer: Self-pay | Admitting: Obstetrics and Gynecology

## 2018-03-21 VITALS — BP 143/73 | HR 79 | Ht 63.0 in | Wt 238.6 lb

## 2018-03-21 DIAGNOSIS — R1030 Lower abdominal pain, unspecified: Secondary | ICD-10-CM | POA: Diagnosis not present

## 2018-03-21 DIAGNOSIS — N8111 Cystocele, midline: Secondary | ICD-10-CM

## 2018-03-21 NOTE — Patient Instructions (Signed)

## 2018-03-21 NOTE — Progress Notes (Signed)
[facility] Visit  03/21/2018            Patient name: Sandra [name] MRN [id number]  Date of birth: February 07, 1951  CC & HPI:  Sandra [name] is a 67 y.o. female presenting today for persistent, worsening, cystocele x 20 years. Pt reports associated constant back pain, urinary frequency, urinary urgency, and intermittent lower abdominal pain. She had a gradual relief of her lower abdominal pain that resolves with urination. Pt hasn't tried any medications for the relief of her symptoms. She denies hx of UTI's. She was recently treated for a yeast infection to the inguinal folds with diflucan Rx. She takes Miralax daily. She hasn't been sexually active within the past 30 years. She has about 1-2 episodes of needing to urinate at night time. She denies fever, chills, urinary stress incontinence, urinary dribbling, and any other symptoms.    ROS:  ROS  +Cystocele +Urinary frequency +Urinary urgency -fever -chills -urinary stress incontinence All systems are negative except as noted in the HPI and PMH.    Pertinent History Reviewed:   Reviewed: Significant for DM Medical         Past Medical History:  Diagnosis Date  . Acid reflux 2000   HA with nexium   . Anemia   . Angio-edema    caused by Lisinopril  . Arthritis   . Chest pain, unspecified 03/25/2014   MI ruled out. CTA negative PE  . Diabetes mellitus 2012   borderline diabetes, never on medicine   . Enteritis 04/23/2011  . Hypercholesteremia 2011  . Hypertension 1990s   . Ileitis 03/24/2014  . Moderate persistent asthma in adult without complication 07/11/2692  . Shortness of breath dyspnea   . Sleep apnea                               Surgical Hx:    Past Surgical History:  Procedure Laterality Date  . ABDOMINAL HYSTERECTOMY  1983   . CATARACT EXTRACTION W/PHACO Right 01/01/2015   Procedure: CATARACT EXTRACTION PHACO AND INTRAOCULAR LENS PLACEMENT (IOC);  Surgeon: Rutherford Guys, MD;  Location: [facility];   Service: Ophthalmology;  Laterality: Right;  CDE:5.11  . CATARACT EXTRACTION W/PHACO Left 01/15/2015   Procedure: CATARACT EXTRACTION PHACO AND INTRAOCULAR LENS PLACEMENT (IOC);  Surgeon: Rutherford Guys, MD;  Location: [facility];  Service: Ophthalmology;  Laterality: Left;  CDE:4.11  . COLONOSCOPY N/A 07/22/2015   Dr. Oneida Alar: simple adenomas, left colon redundant, internal hemorrhoids, surveillance 2020  . ESOPHAGOGASTRODUODENOSCOPY N/A 07/22/2015   mild non-erosive gastritis, dysphagia due to web and/or uncontrolled GERD, empiric dilation  . HEMORRHOID SURGERY N/A 03/16/2016   Procedure: EXTENSIVE HEMORRHOIDECTOMY;  Surgeon: Aviva Signs, MD;  Location: [facility];  Service: General;  Laterality: N/A;  . [place]   . SAVORY DILATION N/A 07/22/2015   Procedure: SAVORY DILATION;  Surgeon: Danie Binder, MD;  Location: [facility];  Service: Endoscopy;  Laterality: N/A;  . TONSILLECTOMY    . TUBAL LIGATION  1983    Medications: Reviewed & Updated - see associated section                       Current Outpatient Medications:  .  albuterol (PROVENTIL HFA;VENTOLIN HFA) 108 (90 BASE) MCG/ACT inhaler, Inhale 1-2 puffs into the lungs every 6 (six) hours as needed for wheezing or shortness of breath., Disp: 1 Inhaler, Rfl:  0 .  albuterol (PROVENTIL) (2.5 MG/3ML) 0.083% nebulizer solution, Take 2.5 mg by nebulization every 6 (six) hours as needed for wheezing or shortness of breath., Disp: , Rfl:  .  aspirin EC 81 MG tablet, Take 81 mg by mouth daily., Disp: , Rfl:  .  beclomethasone (QVAR) 80 MCG/ACT inhaler, Use 2 puffs twice daily to prevent cough or wheeze.  Rinse, gargle, and spit after use. (Patient taking differently: as needed. Use 2 puffs twice daily to prevent cough or wheeze.  Rinse, gargle, and spit after use.), Disp: 1 Inhaler, Rfl: 5 .  diltiazem (DILACOR XR) 180 MG 24 hr capsule, Take 180 mg by mouth daily., Disp: , Rfl:  .  fluconazole (DIFLUCAN) 100 MG tablet, Take 1 daily for 10  days, Disp: 10 tablet, Rfl: 0 .  fluticasone (FLONASE) 50 MCG/ACT nasal spray, Place into both nostrils as needed. , Disp: , Rfl:  .  hydrochlorothiazide (HYDRODIURIL) 25 MG tablet, Take 1 tablet (25 mg total) by mouth daily., Disp: 30 tablet, Rfl: 2 .  metoprolol succinate (TOPROL-XL) 50 MG 24 hr tablet, Take 1 tablet by mouth daily., Disp: , Rfl:  .  omeprazole (PRILOSEC) 20 MG capsule, Take 1 capsule (20 mg total) by mouth 2 (two) times daily before a meal. 30 minutes prior, Disp: 180 capsule, Rfl: 3 .  oxybutynin (DITROPAN XL) 5 MG 24 hr tablet, Take 1 tablet (5 mg total) by mouth at bedtime., Disp: 90 tablet, Rfl: 3 .  atorvastatin (LIPITOR) 10 MG tablet, Take 1 tablet (10 mg total) by mouth daily., Disp: 90 tablet, Rfl: 0 .  fluticasone (FLOVENT HFA) 110 MCG/ACT inhaler, Inhale 2 puffs into the lungs 2 (two) times daily., Disp: 1 Inhaler, Rfl: 1   Social History: Reviewed -  reports that she has never smoked. She has never used smokeless tobacco.  Objective Findings:  Vitals: Blood pressure (!) 143/73, pulse 79, height 5\' 3"  (1.6 m), weight 238 lb 9.6 oz (108.2 kg).  PHYSICAL EXAMINATION General appearance - alert, well appearing, and in no distress Mental status - alert, oriented to person, place, and time  PELVIC Pelvic - normal external genitalia, vulva, vagina, cervix, uterus and adnexa, S/p hysterectomy.  VULVA: normal appearing vulva with no masses, tenderness or lesions,  VAGINA: normal appearing vagina with normal color and discharge, no lesions, short vaginal length. Moderate cystocele.  No significant rotation and descent of urethra CERVIX: surgically absent,  UTERUS: surgically absent, vaginal cuff well healed,  ADNEXA: normal adnexa in size, nontender and no masses Rectal: negative without mass, lesions or tenderness.   Discussion: Discussed with pt weight loss methods including food measurement and calorie counting using apps such as MyNetDiary or My Fitness Pal.  Recommended the use of measuring cups and spoons to monitor serving sizes. Encouraged adequate daily water intake, especially prior to meals to eliminate overeating. Additionally encouraged pt to become more active by taking daily walks of at least 30 minute duration, join a local gym such as the [facility] or attend water aerobics classes. Also advised pt to use pedometer on smartphone or utilize a smartband such as FitBit to keep track of daily activity.   At end of discussion, pt had opportunity to ask questions and has no further questions at this time.   Specific discussion of lifestyle changes, behavioral modifications and healthy eating habits as noted above. Greater than 50% was spent in counseling and coordination of care with the patient.  Total time greater than: 25 minutes.  Assessment & Plan:   A:  1.  Stable cystocele  2. Slight urinary retention, adequately controlled by patient valsalva 3. Exogenous obesity, Body mass index is 42.27 kg/m.  P:  1.  Weight loss encouraged today, lower abdominal pain Not likely related to cystocele. 2.  3. Follow up 1 year or PRN    By signing my name below, I, Soijett Blue, attest that this documentation has been prepared under the direction and in the presence of Jonnie Kind, MD. Electronically Signed: Soijett Blue, Medical Scribe. 03/21/18. 2:35 PM.  I personally performed the services described in this documentation, which was SCRIBED in my presence. The recorded information has been reviewed and considered accurate. It has been edited as necessary during review. Jonnie Kind, MD

## 2018-04-04 NOTE — Congregational Nurse Program (Signed)
Congregational Nurse Program Note  Date of Encounter: 04/04/2018  Past Medical History: Past Medical History:  Diagnosis Date  . Acid reflux 2000   HA with nexium   . Anemia   . Angio-edema    caused by Lisinopril  . Arthritis   . Chest pain, unspecified 03/25/2014   MI ruled out. CTA negative PE  . Diabetes mellitus 2012   borderline diabetes, never on medicine   . Enteritis 04/23/2011  . Hypercholesteremia 2011  . Hypertension 1990s   . Ileitis 03/24/2014  . Moderate persistent asthma in adult without complication 02/17/9469  . Shortness of breath dyspnea   . Sleep apnea     Encounter Details: CNP Questionnaire - 04/04/18 1059      Questionnaire   Patient Status  Not Applicable    Race  Black or African American    Location Patient Served At  [employer], [employer]    Uninsured  Not Applicable    Food  No food insecurities    Housing/Utilities  Yes, have permanent housing    Transportation  No transportation needs    Interpersonal Safety  Yes, feel physically and emotionally safe where you currently live    Medication  No medication insecurities    Medical Provider  Yes    Referrals  Not Applicable    ED Visit Averted  Not Applicable    Life-Saving Intervention Made  Not Applicable     Seen.at the [employer].No complaints or concerns Blood Glucose 130 fasting. Has not been told by MD that she has diabetes. s/s of diabetes discussednd  and the importance of eating low carbohydrate diet and refined sugars. B P 145/83 P 83 Also discussed diet and exercise as related to HTN Erma Heritage RN, [place], [phone]

## 2018-04-06 ENCOUNTER — Encounter: Payer: Self-pay | Admitting: Gastroenterology

## 2018-04-06 ENCOUNTER — Other Ambulatory Visit: Payer: Self-pay

## 2018-04-06 ENCOUNTER — Ambulatory Visit (INDEPENDENT_AMBULATORY_CARE_PROVIDER_SITE_OTHER): Payer: Medicare Other | Admitting: Gastroenterology

## 2018-04-06 DIAGNOSIS — K219 Gastro-esophageal reflux disease without esophagitis: Secondary | ICD-10-CM

## 2018-04-06 DIAGNOSIS — R1310 Dysphagia, unspecified: Secondary | ICD-10-CM

## 2018-04-06 NOTE — Assessment & Plan Note (Addendum)
SYMPTOMS FAIRLY WELL CONTROLLED.  CONTINUE YOUR WEIGHT LOSS EFFORTS. Refer to NUTRITION. CONTINUE OMEPRAZOLE.  TAKE 30 MINUTES PRIOR TO YOUR MEALS TWICE DAILY. CONTINUE TO MONITOR SYMPTOMS. FOLLOW UP IN 4 MOS.

## 2018-04-06 NOTE — Patient Instructions (Addendum)
COMPLETE MODIFIED BARIUM SWALLOW.  CONTINUE YOUR WEIGHT LOSS EFFORTS. I WILL REFER YOU TO NUTRITION FOR DIET/WEIGHT LOSS EDUCATION  To reduce constipation:    1. DRINK WATER TO KEEP YOUR URINE LIGHT YELLOW.    2. Follow a HIGH FIBER DIET. EAT PLUMS DAILY.  PLEASE CALL WITH QUESTIONS OR CONCERNS.   FOLLOW UP IN 4 MOS.

## 2018-04-06 NOTE — Assessment & Plan Note (Signed)
SYMPTOMS NOT IDEALLY CONTROLLED. BPE JUL 2019 NO ESOPHAGEAL  MOTILITY DISORDER.  COMPLETE MODIFIED BARIUM SWALLOW. PLEASE CALL WITH QUESTIONS OR CONCERNS. FOLLOW UP IN 4 MOS.

## 2018-04-06 NOTE — Assessment & Plan Note (Signed)
Body mass index is 42.2 kg/m.  WEIGHT UP 14 LBs SINCE 2017. Motivated to lose weight.  COMPLETE MODIFIED BARIUM SWALLOW.  CONTINUE YOUR WEIGHT LOSS EFFORTS.  REFER TO NUTRITION FOR DIET/WEIGHT LOSS EDUCATION PLEASE CALL WITH QUESTIONS OR CONCERNS.   FOLLOW UP IN 4 MOS.

## 2018-04-06 NOTE — Progress Notes (Addendum)
Subjective:    Patient ID: Sandra [name], female    DOB: [birth year], 67 y.o.   MRN: [id number]  [name], MD  HPI Having trouble with BMs. Better IF SHE EATS A PLUMs. CONCERNED ABOUT HER BLOOD SUGAR BUT EATS GRAPES, ORANGES, AND BANANAS. INTERESTED IN WEIGHT LOSS. CUT OUT BREAD. BmS: #1 OR #5-2X/DAY. A LITTLE HEARTBURN. HAS LOWER ABDOMINAL PAIN DUE TO BLADDER PROBLEMS.  CONTINUES TO HAVE TROUBLE SWALLOWING IN SPITE OF NL BPE JUL 2019. HAS TROUBLE SWALLOWING SOLIDS AND LIQUIDS(GETS STRANGLED)  PT DENIES FEVER, CHILLS, HEMATOCHEZIA, HEMATEMESIS, nausea, vomiting, melena, diarrhea, CHEST PAIN, SHORTNESS OF BREATH,  CHANGE IN BOWEL IN HABITS, OR  heartburn or indigestion.  Past Medical History:  Diagnosis Date  . Acid reflux 2000   HA with nexium   . Anemia   . Angio-edema    caused by Lisinopril  . Arthritis   . Chest pain, unspecified 03/25/2014   MI ruled out. CTA negative PE  . Diabetes mellitus 2012   borderline diabetes, never on medicine   . Enteritis 04/23/2011  . Hypercholesteremia 2011  . Hypertension 1990s   . Ileitis 03/24/2014  . Moderate persistent asthma in adult without complication 0/17/4944  . Shortness of breath dyspnea   . Sleep apnea     Past Surgical History:  Procedure Laterality Date  . ABDOMINAL HYSTERECTOMY  1983   . CATARACT EXTRACTION W/PHACO Right 01/01/2015   Procedure: CATARACT EXTRACTION PHACO AND INTRAOCULAR LENS PLACEMENT (IOC);  Surgeon: Rutherford Guys, MD;  Location: [facility];  Service: Ophthalmology;  Laterality: Right;  CDE:5.11  . CATARACT EXTRACTION W/PHACO Left 01/15/2015   Procedure: CATARACT EXTRACTION PHACO AND INTRAOCULAR LENS PLACEMENT (IOC);  Surgeon: Rutherford Guys, MD;  Location: [facility];  Service: Ophthalmology;  Laterality: Left;  CDE:4.11  . COLONOSCOPY N/A 07/22/2015   Dr. Oneida Alar: simple adenomas, left colon redundant, internal hemorrhoids, surveillance 2020  . ESOPHAGOGASTRODUODENOSCOPY N/A 07/22/2015   mild non-erosive  gastritis, dysphagia due to web and/or uncontrolled GERD, empiric dilation  . HEMORRHOID SURGERY N/A 03/16/2016   Procedure: EXTENSIVE HEMORRHOIDECTOMY;  Surgeon: Aviva Signs, MD;  Location: [facility];  Service: General;  Laterality: N/A;  . [place]   . SAVORY DILATION N/A 07/22/2015   Procedure: SAVORY DILATION;  Surgeon: Danie Binder, MD;  [facility];  Service: Endoscopy;  Laterality: N/A;  . TONSILLECTOMY    . TUBAL LIGATION  1983     Allergies  Allergen Reactions  . Ciprofloxacin Itching  . Latex Itching  . Lisinopril Swelling    Tingling, swelling lips   . Penicillins Itching      . Protonix [Pantoprazole Sodium]     NAUSEA  . Sulfa Antibiotics     Stomach irritation        Review of Systems PER HPI OTHERWISE ALL SYSTEMS ARE NEGATIVE.    Objective:   Physical Exam  Constitutional: She is oriented to person, place, and time. She appears well-developed and well-nourished. No distress.  HENT:  Head: Normocephalic and atraumatic.  Mouth/Throat: Oropharynx is clear and moist. No oropharyngeal exudate.  Eyes: Pupils are equal, round, and reactive to light. No scleral icterus.  Neck: Normal range of motion. Neck supple.  Cardiovascular: Normal rate and regular rhythm.  Murmur (SYSTOLIC) heard. Pulmonary/Chest: Effort normal and breath sounds normal. No respiratory distress.  Abdominal: Soft. Bowel sounds are normal. She exhibits no distension. There is no tenderness.  Musculoskeletal: She exhibits no edema.  Lymphadenopathy:    She has  no cervical adenopathy.  Neurological: She is alert and oriented to person, place, and time.  Psychiatric: She has a normal mood and affect.  Vitals reviewed.     Assessment & Plan:

## 2018-04-18 ENCOUNTER — Encounter: Payer: Medicare Other | Attending: Gastroenterology | Admitting: Nutrition

## 2018-04-18 VITALS — Ht 63.0 in | Wt 234.0 lb

## 2018-04-18 DIAGNOSIS — R7303 Prediabetes: Secondary | ICD-10-CM | POA: Diagnosis not present

## 2018-04-18 DIAGNOSIS — Z713 Dietary counseling and surveillance: Secondary | ICD-10-CM | POA: Insufficient documentation

## 2018-04-18 DIAGNOSIS — R739 Hyperglycemia, unspecified: Secondary | ICD-10-CM

## 2018-04-18 DIAGNOSIS — E669 Obesity, unspecified: Secondary | ICD-10-CM

## 2018-04-18 DIAGNOSIS — K219 Gastro-esophageal reflux disease without esophagitis: Secondary | ICD-10-CM | POA: Insufficient documentation

## 2018-04-18 NOTE — Patient Instructions (Signed)
Goals 1 FOllow My Plate  2 Eat three meals per day  Don't skip meals  Drink only water

## 2018-04-18 NOTE — Progress Notes (Signed)
  Medical Nutrition Therapy:  Appt start time: 1130 end time:  1230.   Assessment:  Primary concerns today: Prediabetes and Obesity. Lives by herself. Eats out 50% of the time. Has currently has cut out sodas, sweets and bread. Now drinking water instead of Pepsi. Has lost 7 lbs in the last month. Hasn't starting walking yet but willing to start. Wants to lose weight. Engaged to make changes with eating and exercise.  Lab Results  Component Value Date   HGBA1C 6.1 (H) 01/22/2017   .  Vitals with BMI 04/18/2018  Height 5\' 3"   Weight 234 lbs  BMI 33.54  Systolic   Diastolic   Pulse   Respirations     Preferred Learning Style:   Auditory  Visual  Hands on  No preference indicated    Ready  Change in progress   MEDICATIONS:    DIETARY INTAKE:   24-hr recall:  B ( AM): Plum  Snk ( AM): \  L ( PM): 1 thigh and 1/2 c pinto beand 1/2 biscuit, water Snk ( PM):  D ( PM): 1 thigh, water Snk ( PM): Beverages: water  Usual physical activity: ADL  Estimated energy needs: 1200 calories 135 g carbohydrates 90 g protein 33 g fat  Progress Towards Goal(s):  Some progress.   Nutritional Diagnosis:  NB-1.1 Food and nutrition-related knowledge deficit As related to Prediabetes and Obesity.  As evidenced by A1C 6.1% and BMI 40.     Intervention:  Nutrition and Diabetes education provided on My Plate, CHO counting, meal planning, portion sizes, timing of meals, avoiding snacks between meals unless having a low blood sugar, target ranges for A1C and blood sugars, signs/symptoms and treatment of hyper/hypoglycemia, monitoring blood sugars, taking medications as prescribed, benefits of exercising 30 minutes per day and prevention of complications of DM   Goals 1 FOllow My Plate  2 Eat three meals per day  Don't skip meals  Drink only water   Teaching Method Utilized: Visual Auditory Hands on  Handouts given during visit include:  The Plate Method  Meal Plan  Card   Barriers to learning/adherence to lifestyle change: none  Demonstrated degree of understanding via:  Teach Back   Monitoring/Evaluation:  Dietary intake, exercise, meal planning, and body weight in 3 month(s).

## 2018-04-27 ENCOUNTER — Encounter: Payer: Self-pay | Admitting: Nutrition

## 2018-04-27 ENCOUNTER — Ambulatory Visit (HOSPITAL_COMMUNITY)
Admission: RE | Admit: 2018-04-27 | Discharge: 2018-04-27 | Disposition: A | Discharge status: Home / Self Care | Payer: Medicare Other | Source: Ambulatory Visit | Attending: Family Medicine | Admitting: Family Medicine

## 2018-04-27 DIAGNOSIS — Z1231 Encounter for screening mammogram for malignant neoplasm of breast: Secondary | ICD-10-CM

## 2018-06-22 ENCOUNTER — Ambulatory Visit: Payer: Medicare Other | Admitting: Nutrition

## 2018-07-20 ENCOUNTER — Encounter: Payer: Self-pay | Admitting: Gastroenterology

## 2018-07-28 ENCOUNTER — Ambulatory Visit: Payer: Medicare Other | Admitting: Nutrition

## 2018-08-19 ENCOUNTER — Encounter: Payer: Self-pay | Admitting: Cardiovascular Disease

## 2018-08-19 ENCOUNTER — Ambulatory Visit (INDEPENDENT_AMBULATORY_CARE_PROVIDER_SITE_OTHER): Payer: Medicare Other | Admitting: Cardiovascular Disease

## 2018-08-19 VITALS — BP 126/70 | HR 73 | Ht 63.0 in | Wt 231.8 lb

## 2018-08-19 DIAGNOSIS — E785 Hyperlipidemia, unspecified: Secondary | ICD-10-CM

## 2018-08-19 DIAGNOSIS — I10 Essential (primary) hypertension: Secondary | ICD-10-CM | POA: Diagnosis not present

## 2018-08-19 DIAGNOSIS — R5383 Other fatigue: Secondary | ICD-10-CM

## 2018-08-19 DIAGNOSIS — R079 Chest pain, unspecified: Secondary | ICD-10-CM

## 2018-08-19 DIAGNOSIS — M7989 Other specified soft tissue disorders: Secondary | ICD-10-CM

## 2018-08-19 NOTE — Addendum Note (Signed)
Addended by: Barbarann Ehlers A on: 08/19/2018 11:12 AM   Modules accepted: Orders

## 2018-08-19 NOTE — Patient Instructions (Signed)
Medication Instructions: Your physician recommends that you continue on your current medications as directed. Please refer to the Current Medication list given to you today.   Labwork: None today  Procedures/Testing: Your physician has requested that you have an echocardiogram. Echocardiography is a painless test that uses sound waves to create images of your heart. It provides your doctor with information about the size and shape of your heart and how well your heart's chambers and valves are working. This procedure takes approximately one hour. There are no restrictions for this procedure.  Your physician has requested that you have a lexiscan myoview. For further information please visit [email or web address]. Please follow instruction sheet, as given.    Follow-Up: 2 months with Dr.Koneswaran  Any Additional Special Instructions Will Be Listed Below (If Applicable).     If you need a refill on your cardiac medications before your next appointment, please call your pharmacy.

## 2018-08-19 NOTE — Progress Notes (Signed)
CARDIOLOGY CONSULT NOTE  Patient ID: Sandra [name] MRN: [id number] DOB/AGE: December 28, 1950 68 y.o.  Admit date: (Not on file) Primary Physician: [name], MD Referring Physician: [name], MD  Reason for Consultation: Chest tightness  HPI: Sandra [name] is a 68 y.o. female who is being seen today for the evaluation of chest tightness at the request of [name], MD.   ECG performed in the office today which I ordered and personally interpreted demonstrates normal sinus rhythm with no ischemic ST segment or T-wave abnormalities, nor any arrhythmias.  For the past year, she has been experiencing exertional fatigue, near syncope, and chest tightness with routine household activities such as sweeping and mopping.  It started about a year ago but has been getting progressively worse.  She is able to mop 2 rooms before having to sit down due to the symptoms.  She denies palpitations and syncope.  She has also had bilateral leg swelling intermittently over the past year.  She has had pain in her right leg associated with this.  She has never smoked or used alcohol.    Current Outpatient Medications  Medication Sig Dispense Refill  . albuterol (PROVENTIL HFA;VENTOLIN HFA) 108 (90 BASE) MCG/ACT inhaler Inhale 1-2 puffs into the lungs every 6 (six) hours as needed for wheezing or shortness of breath. 1 Inhaler 0  . albuterol (PROVENTIL) (2.5 MG/3ML) 0.083% nebulizer solution Take 2.5 mg by nebulization every 6 (six) hours as needed for wheezing or shortness of breath.    Marland Kitchen aspirin EC 81 MG tablet Take 81 mg by mouth daily.    Marland Kitchen atorvastatin (LIPITOR) 10 MG tablet Take 1 tablet (10 mg total) by mouth daily. 90 tablet 0  . beclomethasone (QVAR) 80 MCG/ACT inhaler Use 2 puffs twice daily to prevent cough or wheeze.  Rinse, gargle, and spit after use. (Patient taking differently: as needed. Use 2 puffs twice daily to prevent cough or wheeze.  Rinse, gargle, and  spit after use.) 1 Inhaler 5  . diltiazem (DILACOR XR) 180 MG 24 hr capsule Take 180 mg by mouth daily.    . fluconazole (DIFLUCAN) 100 MG tablet Take 1 daily for 10 days 10 tablet 0  . fluticasone (FLONASE) 50 MCG/ACT nasal spray Place into both nostrils as needed.     . hydrochlorothiazide (HYDRODIURIL) 25 MG tablet Take 1 tablet (25 mg total) by mouth daily. 30 tablet 2  . metoprolol succinate (TOPROL-XL) 50 MG 24 hr tablet Take 1 tablet by mouth daily.    Marland Kitchen omeprazole (PRILOSEC) 20 MG capsule Take 1 capsule (20 mg total) by mouth 2 (two) times daily before a meal. 30 minutes prior 180 capsule 3  . oxybutynin (DITROPAN XL) 5 MG 24 hr tablet Take 1 tablet (5 mg total) by mouth at bedtime. 90 tablet 3  . fluticasone (FLOVENT HFA) 110 MCG/ACT inhaler Inhale 2 puffs into the lungs 2 (two) times daily. (Patient not taking: Reported on 04/06/2018) 1 Inhaler 1   No current facility-administered medications for this visit.     Past Medical History:  Diagnosis Date  . Acid reflux 2000   HA with nexium   . Anemia   . Angio-edema    caused by Lisinopril  . Arthritis   . Chest pain, unspecified 03/25/2014   MI ruled out. CTA negative PE  . Diabetes mellitus 2012   borderline diabetes, never on medicine   . Enteritis 04/23/2011  . Hypercholesteremia 2011  . Hypertension 1990s   .  Ileitis 03/24/2014  . Moderate persistent asthma in adult without complication 1/61/0960  . Shortness of breath dyspnea   . Sleep apnea     Past Surgical History:  Procedure Laterality Date  . ABDOMINAL HYSTERECTOMY  1983   . CATARACT EXTRACTION W/PHACO Right 01/01/2015   Procedure: CATARACT EXTRACTION PHACO AND INTRAOCULAR LENS PLACEMENT (IOC);  Surgeon: Rutherford Guys, MD;  Location: [facility];  Service: Ophthalmology;  Laterality: Right;  CDE:5.11  . CATARACT EXTRACTION W/PHACO Left 01/15/2015   Procedure: CATARACT EXTRACTION PHACO AND INTRAOCULAR LENS PLACEMENT (IOC);  Surgeon: Rutherford Guys, MD;  Location: [facility];   Service: Ophthalmology;  Laterality: Left;  CDE:4.11  . COLONOSCOPY N/A 07/22/2015   Dr. Oneida Alar: simple adenomas, left colon redundant, internal hemorrhoids, surveillance 2020  . ESOPHAGOGASTRODUODENOSCOPY N/A 07/22/2015   mild non-erosive gastritis, dysphagia due to web and/or uncontrolled GERD, empiric dilation  . HEMORRHOID SURGERY N/A 03/16/2016   Procedure: EXTENSIVE HEMORRHOIDECTOMY;  Surgeon: Aviva Signs, MD;  Location: [facility];  Service: General;  Laterality: N/A;  . [place]   . SAVORY DILATION N/A 07/22/2015   Procedure: SAVORY DILATION;  Surgeon: Danie Binder, MD;  Location: [facility];  Service: Endoscopy;  Laterality: N/A;  . TONSILLECTOMY    . TUBAL LIGATION  1983     Social History   Socioeconomic History  . Marital status: Divorced    Spouse name: Not on file  . Number of children: 3   . Years of education: some colle  . Highest education level: Not on file  Occupational History  . 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 Financial resource strain: Not on file  . Food insecurity:    Worry: Not on file    Inability: Not on file  . Transportation needs:    Medical: Not on file    Non-medical: Not on file  Tobacco Use  . Smoking status: Never Smoker  . Smokeless tobacco: Never Used  Substance and Sexual Activity  . Alcohol use: No  . Drug use: No  . Sexual activity: Not Currently    Birth control/protection: Surgical    Comment: hyst  Lifestyle  . Physical activity:    Days per week: Not on file    Minutes per session: Not on file  . Stress: Not on file  Relationships  . Social connections:    Talks on phone: Not on file    Gets together: Not on file    Attends religious service: Not on file    Active member of club or organization: Not on file    Attends meetings of clubs or organizations: Not on file    Relationship status: Not on file  . Intimate partner violence:    Fear of current or ex partner: Not on  file    Emotionally abused: Not on file    Physically abused: Not on file    Forced sexual activity: Not on file  Other Topics Concern  . Not on file  Social History Narrative   3 children-2 living, sons. Lives alone. 7 grandchildren. 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      No family history of premature CAD in 1st degree relatives.  Current Meds  Medication Sig  . albuterol (PROVENTIL HFA;VENTOLIN HFA) 108 (90 BASE) MCG/ACT inhaler Inhale 1-2 puffs into the lungs every 6 (six) hours as needed for wheezing or shortness of breath.  Marland Kitchen albuterol (PROVENTIL) (  2.5 MG/3ML) 0.083% nebulizer solution Take 2.5 mg by nebulization every 6 (six) hours as needed for wheezing or shortness of breath.  Marland Kitchen aspirin EC 81 MG tablet Take 81 mg by mouth daily.  Marland Kitchen atorvastatin (LIPITOR) 10 MG tablet Take 1 tablet (10 mg total) by mouth daily.  . beclomethasone (QVAR) 80 MCG/ACT inhaler Use 2 puffs twice daily to prevent cough or wheeze.  Rinse, gargle, and spit after use. (Patient taking differently: as needed. Use 2 puffs twice daily to prevent cough or wheeze.  Rinse, gargle, and spit after use.)  . diltiazem (DILACOR XR) 180 MG 24 hr capsule Take 180 mg by mouth daily.  . fluconazole (DIFLUCAN) 100 MG tablet Take 1 daily for 10 days  . fluticasone (FLONASE) 50 MCG/ACT nasal spray Place into both nostrils as needed.   . hydrochlorothiazide (HYDRODIURIL) 25 MG tablet Take 1 tablet (25 mg total) by mouth daily.  . metoprolol succinate (TOPROL-XL) 50 MG 24 hr tablet Take 1 tablet by mouth daily.  Marland Kitchen omeprazole (PRILOSEC) 20 MG capsule Take 1 capsule (20 mg total) by mouth 2 (two) times daily before a meal. 30 minutes prior  . oxybutynin (DITROPAN XL) 5 MG 24 hr tablet Take 1 tablet (5 mg total) by mouth at bedtime.      Review of systems complete and found to be negative unless listed above in HPI    Physical exam Blood pressure 126/70, pulse 73, height 5\' 3"  (1.6 m), weight 231 lb  12.8 oz (105.1 kg), SpO2 94 %. General: NAD Neck: No JVD, no thyromegaly or thyroid nodule.  Lungs: Clear to auscultation bilaterally with normal respiratory effort. CV: Nondisplaced PMI. Regular rate and rhythm, normal S1/S2, no S3/S4, no murmur.  No peripheral edema.  No carotid bruit.    Abdomen: Soft, nontender, obese.  Skin: Intact without lesions or rashes.  Neurologic: Alert and oriented x 3.  Psych: Normal affect. Extremities: No clubbing or cyanosis.  HEENT: Normal.   ECG: Most recent ECG reviewed.   Labs: Lab Results  Component Value Date/Time   K 3.5 01/22/2017 03:07 PM   BUN 19 01/22/2017 03:07 PM   CREATININE 0.98 01/22/2017 03:07 PM   ALT 12 (L) 01/22/2017 03:07 PM   TSH 2.235 04/22/2016 04:17 PM   TSH 1.51 10/31/2015 09:43 AM   HGB 11.3 (L) 01/22/2017 03:07 PM     Lipids: Lab Results  Component Value Date/Time   LDLCALC 58 01/22/2017 03:07 PM   CHOL 115 01/22/2017 03:07 PM   TRIG 97 01/22/2017 03:07 PM   HDL 38 (L) 01/22/2017 03:07 PM        ASSESSMENT AND PLAN:   1.  Chest tightness and exertional fatigue: Unclear etiology.  Risk factors include hypertension and hyperlipidemia. I will proceed with a nuclear myocardial perfusion imaging study to evaluate for ischemic heart disease (Lexiscan Myoview).  2.  Hypertension: Controlled on present therapy.  She is on diltiazem and Toprol-XL.  No changes to therapy.  3.  Hyperlipidemia: Currently on atorvastatin 10 mg.  4.  Bilateral leg edema: She is currently on hydrochlorothiazide and there is no evidence of leg swelling today. I will order a 2-D echocardiogram with Doppler to evaluate cardiac structure, function, and regional wall motion.     Disposition: Follow up in 2 months  Signed: Kate Sable, M.D., F.A.C.C.  08/19/2018, 10:51 AM

## 2018-08-29 ENCOUNTER — Ambulatory Visit (HOSPITAL_COMMUNITY)
Admission: RE | Admit: 2018-08-29 | Discharge: 2018-08-29 | Disposition: A | Discharge status: Home / Self Care | Payer: Medicare Other | Source: Ambulatory Visit | Attending: Cardiovascular Disease | Admitting: Cardiovascular Disease

## 2018-08-29 ENCOUNTER — Encounter (HOSPITAL_BASED_OUTPATIENT_CLINIC_OR_DEPARTMENT_OTHER)
Admission: RE | Admit: 2018-08-29 | Discharge: 2018-08-29 | Disposition: A | Discharge status: Home / Self Care | Payer: Medicare Other | Source: Ambulatory Visit | Attending: Cardiovascular Disease | Admitting: Cardiovascular Disease

## 2018-08-29 ENCOUNTER — Encounter (HOSPITAL_COMMUNITY)
Admission: RE | Admit: 2018-08-29 | Discharge: 2018-08-29 | Disposition: A | Discharge status: Home / Self Care | Payer: Medicare Other | Source: Ambulatory Visit | Attending: Cardiovascular Disease | Admitting: Cardiovascular Disease

## 2018-08-29 DIAGNOSIS — R079 Chest pain, unspecified: Secondary | ICD-10-CM | POA: Insufficient documentation

## 2018-08-29 DIAGNOSIS — M7989 Other specified soft tissue disorders: Secondary | ICD-10-CM

## 2018-08-29 LAB — NM MYOCAR MULTI W/SPECT W/WALL MOTION / EF
CHL CUP NUCLEAR SDS: 3
CHL CUP NUCLEAR SRS: 0
CHL CUP RESTING HR STRESS: 63 {beats}/min
LHR: 0.21
LV dias vol: 50 mL (ref 46–106)
LVSYSVOL: 16 mL
Peak HR: 100 {beats}/min
SSS: 3
TID: 1.17

## 2018-08-29 MED ORDER — TECHNETIUM TC 99M TETROFOSMIN IV KIT
10.0000 | PACK | Freq: Once | INTRAVENOUS | Status: AC | PRN
  Administered 2018-08-29: 9.27 via INTRAVENOUS

## 2018-08-29 MED ORDER — SODIUM CHLORIDE 0.9% FLUSH
INTRAVENOUS | Status: AC
  Administered 2018-08-29: 10 mL via INTRAVENOUS
  Filled 2018-08-29: qty 10

## 2018-08-29 MED ORDER — REGADENOSON 0.4 MG/5ML IV SOLN
INTRAVENOUS | Status: AC
  Administered 2018-08-29: 0.4 mg via INTRAVENOUS
  Filled 2018-08-29: qty 5

## 2018-08-29 MED ORDER — TECHNETIUM TC 99M TETROFOSMIN IV KIT
30.0000 | PACK | Freq: Once | INTRAVENOUS | Status: AC | PRN
  Administered 2018-08-29: 30.1 via INTRAVENOUS

## 2018-08-29 NOTE — Progress Notes (Signed)
*  PRELIMINARY RESULTS* Echocardiogram 2D Echocardiogram has been performed. Patient refused definity. Leavy Cella 08/29/2018, 12:29 PM

## 2018-09-08 ENCOUNTER — Ambulatory Visit: Payer: Medicare Other | Admitting: Podiatry

## 2018-09-08 ENCOUNTER — Ambulatory Visit (INDEPENDENT_AMBULATORY_CARE_PROVIDER_SITE_OTHER): Payer: Medicare Other | Admitting: Obstetrics and Gynecology

## 2018-09-08 ENCOUNTER — Encounter: Payer: Self-pay | Admitting: Obstetrics and Gynecology

## 2018-09-08 VITALS — BP 135/78 | HR 79 | Ht 63.0 in | Wt 235.0 lb

## 2018-09-08 DIAGNOSIS — B373 Candidiasis of vulva and vagina: Secondary | ICD-10-CM | POA: Diagnosis not present

## 2018-09-08 DIAGNOSIS — L739 Follicular disorder, unspecified: Secondary | ICD-10-CM

## 2018-09-08 DIAGNOSIS — L304 Erythema intertrigo: Secondary | ICD-10-CM

## 2018-09-08 DIAGNOSIS — N8111 Cystocele, midline: Secondary | ICD-10-CM | POA: Diagnosis not present

## 2018-09-08 DIAGNOSIS — B3731 Acute candidiasis of vulva and vagina: Secondary | ICD-10-CM

## 2018-09-08 MED ORDER — KETOCONAZOLE 2 % EX CREA: 1.0000 "application " | TOPICAL_CREAM | Freq: Every day | CUTANEOUS | 1 refills | Status: DC

## 2018-09-08 MED ORDER — CEPHALEXIN 500 MG PO CAPS
500.0000 mg | ORAL_CAPSULE | Freq: Three times a day (TID) | ORAL | 0 refills | Status: DC
Start: 2018-09-08 — End: 2020-09-24

## 2018-09-08 NOTE — Progress Notes (Signed)
Patient ID: Sandra [name], female   DOB: 07/29/1950, 68 y.o.   MRN: [id number]    [facility] Visit  @DATE @            Patient name: Sandra [name] MRN [id number]  Date of birth: May 15, 1951  CC & HPI:  Sandra [name] is a 68 y.o. female REFERRED BY DR Karie Kirks presenting today for a possible infection. Pt has irritation in the creases of her thighs and stomach that has been there for over a year. She has recurring  itchy spots on her inner thighs that she puts peroxide on, which helps. The patient denies fever, chills or any other symptoms or complaints at this time.  She has a skin boil on left inner thigh that is tender, needs draining ROS:  ROS + inner thigh irritation - fever - chills All systems are negative except as noted in the HPI and PMH.   Pertinent History Reviewed:   Reviewed: Medical         Past Medical History:  Diagnosis Date  . Acid reflux 2000   HA with nexium   . Anemia   . Angio-edema    caused by Lisinopril  . Arthritis   . Chest pain, unspecified 03/25/2014   MI ruled out. CTA negative PE  . Diabetes mellitus 2012   borderline diabetes, never on medicine   . Enteritis 04/23/2011  . Hypercholesteremia 2011  . Hypertension 1990s   . Ileitis 03/24/2014  . Moderate persistent asthma in adult without complication 08/25/2540  . Shortness of breath dyspnea   . Sleep apnea                               Surgical Hx:    Past Surgical History:  Procedure Laterality Date  . ABDOMINAL HYSTERECTOMY  1983   . CATARACT EXTRACTION W/PHACO Right 01/01/2015   Procedure: CATARACT EXTRACTION PHACO AND INTRAOCULAR LENS PLACEMENT (IOC);  Surgeon: Rutherford Guys, MD;  Location: [facility];  Service: Ophthalmology;  Laterality: Right;  CDE:5.11  . CATARACT EXTRACTION W/PHACO Left 01/15/2015   Procedure: CATARACT EXTRACTION PHACO AND INTRAOCULAR LENS PLACEMENT (IOC);  Surgeon: Rutherford Guys, MD;  Location: [facility];  Service: Ophthalmology;  Laterality: Left;   CDE:4.11  . COLONOSCOPY N/A 07/22/2015   Dr. Oneida Alar: simple adenomas, left colon redundant, internal hemorrhoids, surveillance 2020  . ESOPHAGOGASTRODUODENOSCOPY N/A 07/22/2015   mild non-erosive gastritis, dysphagia due to web and/or uncontrolled GERD, empiric dilation  . HEMORRHOID SURGERY N/A 03/16/2016   Procedure: EXTENSIVE HEMORRHOIDECTOMY;  Surgeon: Aviva Signs, MD;  Location: [facility];  Service: General;  Laterality: N/A;  . [place]   . SAVORY DILATION N/A 07/22/2015   Procedure: SAVORY DILATION;  Surgeon: Danie Binder, MD;  Location: [facility];  Service: Endoscopy;  Laterality: N/A;  . TONSILLECTOMY    . TUBAL LIGATION  1983    Medications: Reviewed & Updated - see associated section                       Current Outpatient Medications:  .  aspirin EC 81 MG tablet, Take 81 mg by mouth daily., Disp: , Rfl:  .  atorvastatin (LIPITOR) 10 MG tablet, Take 1 tablet (10 mg total) by mouth daily., Disp: 90 tablet, Rfl: 0 .  beclomethasone (QVAR) 80 MCG/ACT inhaler, Use 2 puffs twice daily to prevent cough or wheeze.  Rinse, gargle,  and spit after use. (Patient taking differently: as needed. Use 2 puffs twice daily to prevent cough or wheeze.  Rinse, gargle, and spit after use.), Disp: 1 Inhaler, Rfl: 5 .  diltiazem (DILACOR XR) 180 MG 24 hr capsule, Take 180 mg by mouth daily., Disp: , Rfl:  .  hydrochlorothiazide (HYDRODIURIL) 25 MG tablet, Take 1 tablet (25 mg total) by mouth daily., Disp: 30 tablet, Rfl: 2 .  metoprolol succinate (TOPROL-XL) 50 MG 24 hr tablet, Take 1 tablet by mouth daily., Disp: , Rfl:  .  omeprazole (PRILOSEC) 20 MG capsule, Take 1 capsule (20 mg total) by mouth 2 (two) times daily before a meal. 30 minutes prior, Disp: 180 capsule, Rfl: 3 .  oxybutynin (DITROPAN XL) 5 MG 24 hr tablet, Take 1 tablet (5 mg total) by mouth at bedtime., Disp: 90 tablet, Rfl: 3 .  albuterol (PROVENTIL HFA;VENTOLIN HFA) 108 (90 BASE) MCG/ACT inhaler, Inhale 1-2 puffs  into the lungs every 6 (six) hours as needed for wheezing or shortness of breath. (Patient not taking: Reported on 09/08/2018), Disp: 1 Inhaler, Rfl: 0 .  albuterol (PROVENTIL) (2.5 MG/3ML) 0.083% nebulizer solution, Take 2.5 mg by nebulization every 6 (six) hours as needed for wheezing or shortness of breath., Disp: , Rfl:  .  cephALEXin (KEFLEX) 500 MG capsule, Take 1 capsule (500 mg total) by mouth 3 (three) times daily., Disp: 21 capsule, Rfl: 0 .  fluconazole (DIFLUCAN) 100 MG tablet, Take 1 daily for 10 days (Patient not taking: Reported on 09/08/2018), Disp: 10 tablet, Rfl: 0 .  fluticasone (FLONASE) 50 MCG/ACT nasal spray, Place into both nostrils as needed. , Disp: , Rfl:  .  fluticasone (FLOVENT HFA) 110 MCG/ACT inhaler, Inhale 2 puffs into the lungs 2 (two) times daily. (Patient not taking: Reported on 04/06/2018), Disp: 1 Inhaler, Rfl: 1 .  ketoconazole (NIZORAL) 2 % cream, Apply 1 application topically daily. Apply to irritated skin when dry.5x/wk, Disp: 60 g, Rfl: 1  Social History: Reviewed -  reports that she has never smoked. She has never used smokeless tobacco.  Objective Findings:  Vitals: Blood pressure 135/78, pulse 79, height 5\' 3"  (1.6 m), weight 235 lb (106.6 kg).  PHYSICAL EXAMINATION General appearance - alert, well appearing, and in no distress, oriented to person, place, and time and overweight Mental status - alert, oriented to person, place, and time, normal mood, behavior, speech, dress, motor activity, and thought processes, affect appropriate to mood  PELVIC External genitalia - folliculitis on left inner thigh 1 cm distal to inguinal crease, inguinal intertrigo Vagina - stable cystocele Cervix - surgically absent Uterus - surgically absent No vaginal yeast. Assessment & Plan:   A:  1. Obesity Body mass index is 41.63 kg/m.  2. Left inner inner thigh folliculitis 3. Inguinal intertrigo  P:  1. Rx Ketoconazole 2. Rx Keflex 3. F/U prn  By signing my name  below, I, De Burrs, attest that this documentation has been prepared under the direction and in the presence of Jonnie Kind, MD. Electronically Signed: De Burrs, Medical Scribe. 09/08/18. 12:43 PM.  I personally performed the services described in this documentation, which was SCRIBED in my presence. The recorded information has been reviewed and considered accurate. It has been edited as necessary during review. Jonnie Kind, MD

## 2018-09-12 ENCOUNTER — Ambulatory Visit (INDEPENDENT_AMBULATORY_CARE_PROVIDER_SITE_OTHER): Payer: Medicare Other

## 2018-09-12 ENCOUNTER — Other Ambulatory Visit: Payer: Self-pay | Admitting: Podiatry

## 2018-09-12 ENCOUNTER — Ambulatory Visit (INDEPENDENT_AMBULATORY_CARE_PROVIDER_SITE_OTHER): Payer: Medicare Other | Admitting: Podiatry

## 2018-09-12 ENCOUNTER — Encounter: Payer: Self-pay | Admitting: Podiatry

## 2018-09-12 VITALS — BP 139/88

## 2018-09-12 DIAGNOSIS — M79671 Pain in right foot: Secondary | ICD-10-CM

## 2018-09-12 DIAGNOSIS — M722 Plantar fascial fibromatosis: Secondary | ICD-10-CM

## 2018-09-12 DIAGNOSIS — M79672 Pain in left foot: Secondary | ICD-10-CM | POA: Diagnosis not present

## 2018-09-12 DIAGNOSIS — L600 Ingrowing nail: Secondary | ICD-10-CM

## 2018-09-12 MED ORDER — TRIAMCINOLONE ACETONIDE 10 MG/ML IJ SUSP
10.0000 mg | Freq: Once | INTRAMUSCULAR | Status: AC
  Administered 2018-09-12: 10 mg

## 2018-09-14 NOTE — Progress Notes (Signed)
Subjective:   Patient ID: Sandra [name], female   DOB: 68 y.o.   MRN: [id number]   HPI Patient presents with acute pain in both heels of approximate 6 months duration that are increasingly hard to walk with and has chronic ingrown toenail deformity of the hallux bilateral and states that she cannot take care of them herself and is tried to trim and soak them without relief.  Patient does not smoke likes to be active   Review of Systems  All other systems reviewed and are negative.       Objective:  Physical Exam Vitals signs and nursing note reviewed.  Constitutional:      Appearance: She is well-developed.  Pulmonary:     Effort: Pulmonary effort is normal.  Musculoskeletal: Normal range of motion.  Skin:    General: Skin is warm.  Neurological:     Mental Status: She is alert.     Neurovascular status intact muscle strength was found to be adequate range of motion moderately reduced subtalar midtarsal joint.  Patient is found to have exquisite discomfort of the heel bilateral medial band with inflammation fluid around the medial band and is found to have incurvated medial borders of the hallux bilateral with moderate thickness also noted the nails themselves.  Patient has good digital perfusion and is well oriented x3     Assessment:  Acute plantar fasciitis bilateral with ingrown toenail deformity hallux bilateral with the right foot being worse than the left     Plan:  H&P x-rays reviewed and conditions discussed.  Today we will get a focus on the heels and then in 2 weeks we will most likely consider ingrown toenail correction.  Today I did sterile prep and injected each plantar fashion 3 mg Kenalog 5 mg Xylocaine and for the right I applied fascial brace to lift up the arch.  Gave instructions for supportive type therapy and other modalities and reappoint to recheck 2 weeks  X-rays indicate spur formation no indications of stress fracture arthritis

## 2018-09-26 ENCOUNTER — Ambulatory Visit: Payer: Medicare Other | Admitting: Podiatry

## 2018-10-24 ENCOUNTER — Ambulatory Visit: Payer: Medicare Other | Admitting: Cardiovascular Disease

## 2018-11-07 ENCOUNTER — Ambulatory Visit: Payer: Medicare Other | Admitting: Podiatry

## 2019-02-15 ENCOUNTER — Other Ambulatory Visit: Payer: Self-pay | Admitting: Obstetrics & Gynecology

## 2019-05-02 ENCOUNTER — Other Ambulatory Visit: Payer: Self-pay | Admitting: Gastroenterology

## 2019-08-11 ENCOUNTER — Telehealth: Payer: Self-pay

## 2019-08-11 ENCOUNTER — Other Ambulatory Visit: Payer: Self-pay | Admitting: Allergy & Immunology

## 2019-08-11 NOTE — Telephone Encounter (Signed)
Pt. Wanting a refill prescription for her flovent 110 mcg. Called pt. To let her know we are unable to fill her flovent 110 mcg because we haven't seen her in 2 years. Pt. Is aware of her scheduled appointment for 08/18/2019 at 3:30 Friday with Dr. Ernst Bowler. Patient did state she had some flovent 110 mcg.

## 2019-08-14 ENCOUNTER — Other Ambulatory Visit (HOSPITAL_COMMUNITY): Payer: Self-pay | Admitting: Family Medicine

## 2019-08-14 DIAGNOSIS — Z1231 Encounter for screening mammogram for malignant neoplasm of breast: Secondary | ICD-10-CM

## 2019-08-16 ENCOUNTER — Other Ambulatory Visit: Payer: Self-pay

## 2019-08-16 ENCOUNTER — Ambulatory Visit (HOSPITAL_COMMUNITY)
Admission: RE | Admit: 2019-08-16 | Discharge: 2019-08-16 | Disposition: A | Discharge status: Home / Self Care | Payer: Medicare Other | Source: Ambulatory Visit | Attending: Family Medicine | Admitting: Family Medicine

## 2019-08-16 DIAGNOSIS — Z1231 Encounter for screening mammogram for malignant neoplasm of breast: Secondary | ICD-10-CM

## 2019-08-18 ENCOUNTER — Ambulatory Visit: Payer: Self-pay | Admitting: Allergy & Immunology

## 2019-08-23 ENCOUNTER — Encounter: Payer: Self-pay | Admitting: Gastroenterology

## 2019-08-23 ENCOUNTER — Other Ambulatory Visit: Payer: Self-pay

## 2019-08-23 ENCOUNTER — Ambulatory Visit (INDEPENDENT_AMBULATORY_CARE_PROVIDER_SITE_OTHER): Payer: Medicare Other | Admitting: Gastroenterology

## 2019-08-23 DIAGNOSIS — K219 Gastro-esophageal reflux disease without esophagitis: Secondary | ICD-10-CM | POA: Diagnosis not present

## 2019-08-23 NOTE — Assessment & Plan Note (Signed)
SYMPTOMS FAIRLY WELL CONTROLLED.  CONTINUE OMEPRAZOLE.  TAKE 30 MINUTES PRIOR TO YOUR MEALS TWICE DAILY. FOLLOW UP IN 6 MOS.

## 2019-08-23 NOTE — Assessment & Plan Note (Signed)
EAT TO LIVE AND THINK OF FOOD AS MEDICINE. 75% OF YOUR PLATE SHOULD BE FRUITS/VEGGIES.  To have more energy, and to lose weight:     1. CONTINUE YOUR WEIGHT LOSS EFFORTS. I RECOMMEND YOU READ AND FOLLOW RECOMMENDATIONS BY DR. MARK HYMAN, "10-DAY DETOX DIET".   2. If you must eat bread, EAT EZEKIEL BREAD. IT IS IN THE FROZEN SECTION OF THE GROCERY STORE.   3. DRINK WATER WITH FRUIT OR CUCUMBER ADDED. YOUR URINE SHOULD BE LIGHT YELLOW. AVOID SODA, GATORADE, ENERGY DRINKS, OR DIET SODA.    4. AVOID HIGH FRUCTOSE CORN SYRUP AND CAFFEINE.    5. DO NOT chew SUGAR FREE GUM OR USE ARTIFICIAL SWEETENERS. IF NEEDED USE STEVIA AS A SWEETENER.   6. DO NOT EAT ENRICHED WHEAT FLOUR, PASTA, RICE, OR CEREAL.   7. ONLY EAT WILD CAUGHT SEAFOOD, GRASS FED BEEF OR CHICKEN, PORK FROM PASTURE RAISE PIGS, OR EGGS FROM PASTURE RAISED CHICKENS.   8. PRACTICE CHAIR YOGA FOR 15-30 MINS 3 OR 4 TIMES A WEEK AND PROGRESS TO HATHA YOGA OVER NEXT 6 MOS.   9. START TAKING A MULTIVITAMIN, VITAMIN B12, AND VITAMIN D3 2000 IU DAILY.   ADDITIONAL SUPPLEMENTS TO DECREASE CRAVING AND SUPPRESS YOUR APPETITE:    1. CINNAMON 500 MG EVERY AM PRIOR TO FIRST MEAL.   **STABILIZES BLOOD GLUCOSE/REDUCES CRAVINGS**   2. CHROMIUM 400-500 MG WITH MEALS TWICE DAILY.    **FAT BURNER**   3. GREEN TEA EXTRACT ONE DAILY.   **FAT BURNER/SUPPRESSES YOUR APPETITE**   4. ALPHA LIPOIC ACID TWICE DAILY.   **NATURAL ANTI-INFLAMMATORY SUPPLEMENT THAT IS AN ALTERNATIVE TO IBUPROFEN OR NAPROXEN**  FOLLOW UP IN 4 MOS.

## 2019-08-23 NOTE — Patient Instructions (Addendum)
EAT TO LIVE AND THINK OF FOOD AS MEDICINE. 75% OF YOUR PLATE SHOULD BE FRUITS/VEGGIES.  To have more energy, and to lose weight:      1. CONTINUE YOUR WEIGHT LOSS EFFORTS. I RECOMMEND YOU READ AND FOLLOW RECOMMENDATIONS BY DR. MARK HYMAN, "10-DAY DETOX DIET".    2. If you must eat bread, EAT EZEKIEL BREAD. IT IS IN THE FROZEN SECTION OF THE GROCERY STORE.    3. DRINK WATER WITH FRUIT OR CUCUMBER ADDED. YOUR URINE SHOULD BE LIGHT YELLOW. AVOID SODA, GATORADE, ENERGY DRINKS, OR DIET SODA.     4. AVOID HIGH FRUCTOSE CORN SYRUP AND CAFFEINE.     5. DO NOT chew SUGAR FREE GUM OR USE ARTIFICIAL SWEETENERS. IF NEEDED USE STEVIA AS A SWEETENER.    6. DO NOT EAT ENRICHED WHEAT FLOUR, PASTA, RICE, OR CEREAL.    7. ONLY EAT WILD CAUGHT SEAFOOD, GRASS FED BEEF OR CHICKEN, PORK FROM PASTURE RAISE PIGS, OR EGGS FROM PASTURE RAISED CHICKENS.    8. PRACTICE CHAIR YOGA FOR 15-30 MINS 3 OR 4 TIMES A WEEK AND PROGRESS TO HATHA YOGA OVER NEXT 6 MOS.    9. START TAKING A MULTIVITAMIN, VITAMIN B12, AND VITAMIN D3 2000 IU DAILY.   ADDITIONAL SUPPLEMENTS TO DECREASE CRAVING AND SUPPRESS YOUR APPETITE:    1. CINNAMON 500 MG EVERY AM PRIOR TO FIRST MEAL.   **STABILIZES BLOOD GLUCOSE/REDUCES CRAVINGS**    2. CHROMIUM 400-500 MG WITH MEALS TWICE DAILY.    **FAT BURNER**    3. GREEN TEA EXTRACT ONE DAILY.   **FAT BURNER/SUPPRESSES YOUR APPETITE**    4. ALPHA LIPOIC ACID TWICE DAILY.   **NATURAL ANTI-INFLAMMATORY SUPPLEMENT THAT IS AN ALTERNATIVE TO IBUPROFEN OR NAPROXEN**  FOLLOW UP IN 4 MOS.

## 2019-08-23 NOTE — Progress Notes (Signed)
Subjective:    Patient ID: Sandra [name], female    DOB: 1950-12-06, 69 y.o.   MRN: [id number]  [name], MD  HPI Swallowing problems when she eats. If swallows water may have problems. SWALLOWING OVER PAST 1-2 YEARS.LAST BPE JUL 2019. TRYING TO LOSE WEIGHT AND TAKING OLIVE OIL(1 TBSP ONCE DAILY ON AN EMPTY STOMACH). RARE NAUSEA. BMs: REGULAR 3X/DAY(LOOSE). ASTHMA IS UNDER CONTROL.  PT DENIES FEVER, CHILLS, HEMATOCHEZIA, HEMATEMESIS, vomiting, melena, diarrhea, CHEST PAIN, SHORTNESS OF BREATH, CHANGE IN BOWEL IN HABITS, constipation, abdominal pain,  OR heartburn or indigestion.  Past Medical History:  Diagnosis Date  . Acid reflux 2000   HA with nexium   . Anemia   . Angio-edema    caused by Lisinopril  . Arthritis   . Chest pain, unspecified 03/25/2014   MI ruled out. CTA negative PE  . Diabetes mellitus 2012   borderline diabetes, never on medicine   . Enteritis 04/23/2011  . Hypercholesteremia 2011  . Hypertension 1990s   . Ileitis 03/24/2014  . Moderate persistent asthma in adult without complication [id number]  . Shortness of breath dyspnea   . Sleep apnea     Past Surgical History:  Procedure Laterality Date  . ABDOMINAL HYSTERECTOMY  1983   . CATARACT EXTRACTION W/PHACO Right 01/01/2015   Procedure: CATARACT EXTRACTION PHACO AND INTRAOCULAR LENS PLACEMENT (IOC);  Surgeon: Rutherford Guys, MD;  Location: [facility];  Service: Ophthalmology;  Laterality: Right;  CDE:5.11  . CATARACT EXTRACTION W/PHACO Left 01/15/2015   Procedure: CATARACT EXTRACTION PHACO AND INTRAOCULAR LENS PLACEMENT (IOC);  Surgeon: Rutherford Guys, MD;  Location: [facility];  Service: Ophthalmology;  Laterality: Left;  CDE:4.11  . COLONOSCOPY N/A 07/22/2015   Dr. Oneida Alar: simple adenomas, left colon redundant, internal hemorrhoids, surveillance 2020  . ESOPHAGOGASTRODUODENOSCOPY N/A 07/22/2015   mild non-erosive gastritis, dysphagia due to web and/or uncontrolled GERD, empiric dilation  . HEMORRHOID  SURGERY N/A 03/16/2016   Procedure: EXTENSIVE HEMORRHOIDECTOMY;  Surgeon: Aviva Signs, MD;  Location: [facility];  Service: General;  Laterality: N/A;  . [place]   . SAVORY DILATION N/A 07/22/2015   Procedure: SAVORY DILATION;  Surgeon: Danie Binder, MD;  Location: [facility];  Service: Endoscopy;  Laterality: N/A;  . TONSILLECTOMY    . TUBAL LIGATION  1983    Allergies  Allergen Reactions  . Other Swelling    "High Blood Pressure Medicine made lips swell"  . Ciprofloxacin Itching  . Latex Itching  . Lisinopril Swelling    Tingling, swelling lips   . Penicillins Itching      . Protonix [Pantoprazole Sodium]     NAUSEA  . Sulfa Antibiotics     Stomach irritation     Current Outpatient Medications  Medication Sig    . albuterol (PROVENTIL HFA;VENTOLIN HFA) 108 (90 BASE) MCG/ACT inhaler Inhale 1-2 puffs into the lungs every 6 (six) hours as needed for wheezing or shortness of breath.    Marland Kitchen albuterol (PROVENTIL) (2.5 MG/3ML) 0.083% nebulizer solution Take 2.5 mg by nebulization every 6 (six) hours as needed for wheezing or shortness of breath.    Marland Kitchen aspirin EC 81 MG tablet Take 81 mg by mouth daily.    Marland Kitchen atorvastatin (LIPITOR) 10 MG tablet Take 1 tablet (10 mg total) by mouth daily.    . beclomethasone (QVAR) 80 MCG/ACT inhaler Use 2 puffs twice daily to prevent cough or wheeze.  Rinse, gargle, and spit after use. (Patient taking differently: as needed. Use 2  puffs twice daily to prevent cough or wheeze.  Rinse, gargle, and spit after use.)    . cetirizine (HM CETIRIZINE HCL) 10 MG tablet Take 10 mg by mouth daily.    Marland Kitchen diltiazem (DILACOR XR) 180 MG 24 hr capsule Take 180 mg by mouth daily.    . fluticasone (FLOVENT HFA) 110 MCG/ACT inhaler Inhale 2 puffs into the lungs 2 (two) times daily.    . Metoprolol-Hydrochlorothiazide 50-12.5 MG TB24 Take by mouth.    Marland Kitchen omeprazole (PRILOSEC) 20 MG capsule TAKE 1 CAPSULE BY MOUTH TWICE DAILY 30 MINUTES PRIOR TO A MEAL    .  oxybutynin (DITROPAN-XL) 5 MG 24 hr tablet TAKE (1) TABLET BY MOUTH AT BEDTIME.    .      .      . fluticasone (FLONASE) 50 MCG/ACT nasal spray Place into both nostrils as needed.     .      .      . metoprolol succinate (TOPROL-XL) 50 MG 24 hr tablet Take 1 tablet by mouth daily.     Review of Systems PER HPI OTHERWISE ALL SYSTEMS ARE NEGATIVE.    Objective:   Physical Exam Constitutional:      General: She is not in acute distress.    Appearance: Normal appearance.  HENT:     Mouth/Throat:     Comments: MASK IN PLACE Eyes:     General: No scleral icterus.    Pupils: Pupils are equal, round, and reactive to light.  Cardiovascular:     Rate and Rhythm: Normal rate and regular rhythm.     Pulses: Normal pulses.     Heart sounds: Normal heart sounds.  Pulmonary:     Effort: Pulmonary effort is normal.     Breath sounds: Normal breath sounds.  Abdominal:     General: Bowel sounds are normal.     Palpations: Abdomen is soft.     Tenderness: There is no abdominal tenderness.  Musculoskeletal:     Cervical back: Normal range of motion.     Right lower leg: No edema.     Left lower leg: No edema.  Lymphadenopathy:     Cervical: No cervical adenopathy.  Skin:    General: Skin is warm and dry.  Neurological:     Mental Status: She is alert and oriented to person, place, and time.     Comments: NO  NEW FOCAL DEFICITS  Psychiatric:        Mood and Affect: Mood normal.     Comments: NORMAL AFFECT       Assessment & Plan:

## 2019-08-28 ENCOUNTER — Encounter: Payer: Self-pay | Admitting: Gastroenterology

## 2019-10-25 ENCOUNTER — Ambulatory Visit: Payer: Medicare Other | Admitting: Podiatry

## 2019-10-26 ENCOUNTER — Other Ambulatory Visit: Payer: Self-pay

## 2019-10-26 ENCOUNTER — Ambulatory Visit: Payer: Medicare Other | Attending: Internal Medicine

## 2019-10-26 DIAGNOSIS — Z20822 Contact with and (suspected) exposure to covid-19: Secondary | ICD-10-CM

## 2019-10-27 LAB — NOVEL CORONAVIRUS, NAA: SARS-CoV-2, NAA: NOT DETECTED

## 2019-10-27 LAB — SARS-COV-2, NAA 2 DAY TAT

## 2019-11-02 ENCOUNTER — Telehealth: Payer: Self-pay

## 2019-11-02 NOTE — Telephone Encounter (Signed)
Phone call from pt. To obtain COVID test results. Pt. Stated she was tested last Thursday, and has not rec'd any results.  Informed pt. That the test was negative; the virus was "not detected".  Requested to have copy mailed to her residence.  Provided pt.with phone number for [employer] [phone]) to request a copy of her results.  Pt. Verb. Understanding.

## 2019-12-15 ENCOUNTER — Other Ambulatory Visit (HOSPITAL_BASED_OUTPATIENT_CLINIC_OR_DEPARTMENT_OTHER): Payer: Self-pay

## 2019-12-15 DIAGNOSIS — G4733 Obstructive sleep apnea (adult) (pediatric): Secondary | ICD-10-CM

## 2019-12-26 ENCOUNTER — Ambulatory Visit: Payer: Medicare Other | Admitting: Gastroenterology

## 2019-12-26 NOTE — Progress Notes (Deleted)
Primary Care Physician: [name], MD  Primary Gastroenterologist:  Formerly Barney Drain, MD   No chief complaint on file.   HPI: Sandra [name] is a 69 y.o. female here for follow-up.  Last seen in February.  History of GERD and swallowing concerns.  Last EGD January 2017 with mild nonerosive gastritis, dysphagia due to web and/or uncontrolled GERD, empiric dilation performed.  BPE July 2019 showed small hiatal hernia but otherwise unremarkable barium esophagram.  Colonoscopy at the same time was simple adenomas, redundant left colon, internal hemorrhoids, surveillance plan for 2020.  Current Outpatient Medications  Medication Sig Dispense Refill  . albuterol (PROVENTIL HFA;VENTOLIN HFA) 108 (90 BASE) MCG/ACT inhaler Inhale 1-2 puffs into the lungs every 6 (six) hours as needed for wheezing or shortness of breath. 1 Inhaler 0  . albuterol (PROVENTIL) (2.5 MG/3ML) 0.083% nebulizer solution Take 2.5 mg by nebulization every 6 (six) hours as needed for wheezing or shortness of breath.    Marland Kitchen aspirin EC 81 MG tablet Take 81 mg by mouth daily.    Marland Kitchen atorvastatin (LIPITOR) 10 MG tablet Take 1 tablet (10 mg total) by mouth daily. 90 tablet 0  . beclomethasone (QVAR) 80 MCG/ACT inhaler Use 2 puffs twice daily to prevent cough or wheeze.  Rinse, gargle, and spit after use. (Patient taking differently: as needed. Use 2 puffs twice daily to prevent cough or wheeze.  Rinse, gargle, and spit after use.) 1 Inhaler 5  . cephALEXin (KEFLEX) 500 MG capsule Take 1 capsule (500 mg total) by mouth 3 (three) times daily. (Patient not taking: Reported on 08/23/2019) 21 capsule 0  . cetirizine (HM CETIRIZINE HCL) 10 MG tablet Take 10 mg by mouth daily.    Marland Kitchen diltiazem (DILACOR XR) 180 MG 24 hr capsule Take 180 mg by mouth daily.    . fluconazole (DIFLUCAN) 100 MG tablet Take 1 daily for 10 days (Patient not taking: Reported on 08/23/2019) 10 tablet 0  . fluticasone (FLONASE) 50 MCG/ACT nasal spray  Place into both nostrils as needed.     . fluticasone (FLOVENT HFA) 110 MCG/ACT inhaler Inhale 2 puffs into the lungs 2 (two) times daily. 1 Inhaler 1  . hydrochlorothiazide (HYDRODIURIL) 25 MG tablet Take 1 tablet (25 mg total) by mouth daily. (Patient not taking: Reported on 08/23/2019) 30 tablet 2  . ketoconazole (NIZORAL) 2 % cream Apply 1 application topically daily. Apply to irritated skin when dry.5x/wk (Patient not taking: Reported on 08/23/2019) 60 g 1  . metoprolol succinate (TOPROL-XL) 50 MG 24 hr tablet Take 1 tablet by mouth daily.    . Metoprolol-Hydrochlorothiazide 50-12.5 MG TB24 Take by mouth.    Marland Kitchen omeprazole (PRILOSEC) 20 MG capsule TAKE 1 CAPSULE BY MOUTH TWICE DAILY 30 MINUTES PRIOR TO A MEAL 180 capsule 3  . oxybutynin (DITROPAN-XL) 5 MG 24 hr tablet TAKE (1) TABLET BY MOUTH AT BEDTIME. 90 tablet 3   No current facility-administered medications for this visit.    Allergies as of 12/26/2019 - Review Complete 08/23/2019  Allergen Reaction Noted  . Other Swelling 10/08/2015  . Ciprofloxacin Itching 11/28/2015  . Latex Itching 01/01/2015  . Lisinopril Swelling 03/24/2014  . Penicillins Itching 04/23/2011  . Protonix [pantoprazole sodium]  08/08/2015  . Sulfa antibiotics  03/24/2014    ROS:  General: Negative for anorexia, weight loss, fever, chills, fatigue, weakness. ENT: Negative for hoarseness, difficulty swallowing , nasal congestion. CV: Negative for chest pain, angina, palpitations, dyspnea on exertion, peripheral edema.  Respiratory:  Negative for dyspnea at rest, dyspnea on exertion, cough, sputum, wheezing.  GI: See history of present illness. GU:  Negative for dysuria, hematuria, urinary incontinence, urinary frequency, nocturnal urination.  Endo: Negative for unusual weight change.    Physical Examination:   There were no vitals taken for this visit.  General: Well-nourished, well-developed in no acute distress.  Eyes: No icterus. Mouth: Oropharyngeal  mucosa moist and pink , no lesions erythema or exudate. Lungs: Clear to auscultation bilaterally.  Heart: Regular rate and rhythm, no murmurs rubs or gallops.  Abdomen: Bowel sounds are normal, nontender, nondistended, no hepatosplenomegaly or masses, no abdominal bruits or hernia , no rebound or guarding.   Extremities: No lower extremity edema. No clubbing or deformities. Neuro: Alert and oriented x 4   Skin: Warm and dry, no jaundice.   Psych: Alert and cooperative, normal mood and affect.  Labs:  ***  Imaging Studies: No results found.

## 2020-01-10 ENCOUNTER — Other Ambulatory Visit (HOSPITAL_COMMUNITY): Admission: RE | Admit: 2020-01-10 | Payer: Medicare Other | Source: Ambulatory Visit

## 2020-01-26 ENCOUNTER — Other Ambulatory Visit: Payer: Self-pay

## 2020-01-26 ENCOUNTER — Ambulatory Visit: Payer: Medicare Other | Attending: Neurology | Admitting: Neurology

## 2020-01-26 DIAGNOSIS — Z79899 Other long term (current) drug therapy: Secondary | ICD-10-CM | POA: Insufficient documentation

## 2020-01-26 DIAGNOSIS — G4733 Obstructive sleep apnea (adult) (pediatric): Secondary | ICD-10-CM | POA: Diagnosis not present

## 2020-01-26 DIAGNOSIS — Z7982 Long term (current) use of aspirin: Secondary | ICD-10-CM | POA: Insufficient documentation

## 2020-01-26 DIAGNOSIS — Z7951 Long term (current) use of inhaled steroids: Secondary | ICD-10-CM | POA: Diagnosis not present

## 2020-01-28 NOTE — Procedures (Signed)
[place] A. Merlene Laughter, MD     [email or web address]             NOCTURNAL POLYSOMNOGRAPHY   LOCATION: [facility]  Patient Name: [name], [name] Date: 01/26/2020 Gender: Female D.O.B: June 17, 1951 Age (years): 67 Referring Provider: Phillips Odor MD, ABSM Height (inches): 63 Interpreting Physician: Phillips Odor MD, ABSM Weight (lbs): 244 RPSGT: Peak, Robert BMI: 43 MRN: [id number] Neck Size: 17.00 CLINICAL INFORMATION Sleep Study Type: NPSG     Indication for sleep study: N/A     Epworth Sleepiness Score: 12     SLEEP STUDY TECHNIQUE As per the AASM Manual for the Scoring of Sleep and Associated Events v2.3 (April 2016) with a hypopnea requiring 4% desaturations.  The channels recorded and monitored were frontal, central and occipital EEG, electrooculogram (EOG), submentalis EMG (chin), nasal and oral airflow, thoracic and abdominal wall motion, anterior tibialis EMG, snore microphone, electrocardiogram, and pulse oximetry.  MEDICATIONS Medications self-administered by patient taken the night of the study : N/A  Current Outpatient Medications:  .  albuterol (PROVENTIL HFA;VENTOLIN HFA) 108 (90 BASE) MCG/ACT inhaler, Inhale 1-2 puffs into the lungs every 6 (six) hours as needed for wheezing or shortness of breath., Disp: 1 Inhaler, Rfl: 0 .  albuterol (PROVENTIL) (2.5 MG/3ML) 0.083% nebulizer solution, Take 2.5 mg by nebulization every 6 (six) hours as needed for wheezing or shortness of breath., Disp: , Rfl:  .  aspirin EC 81 MG tablet, Take 81 mg by mouth daily., Disp: , Rfl:  .  atorvastatin (LIPITOR) 10 MG tablet, Take 1 tablet (10 mg total) by mouth daily., Disp: 90 tablet, Rfl: 0 .  beclomethasone (QVAR) 80 MCG/ACT inhaler, Use 2 puffs twice daily to prevent cough or wheeze.  Rinse, gargle, and spit after use. (Patient taking differently: as needed. Use 2 puffs twice daily to prevent cough or wheeze.  Rinse, gargle, and spit after  use.), Disp: 1 Inhaler, Rfl: 5 .  cephALEXin (KEFLEX) 500 MG capsule, Take 1 capsule (500 mg total) by mouth 3 (three) times daily. (Patient not taking: Reported on 08/23/2019), Disp: 21 capsule, Rfl: 0 .  cetirizine (HM CETIRIZINE HCL) 10 MG tablet, Take 10 mg by mouth daily., Disp: , Rfl:  .  diltiazem (DILACOR XR) 180 MG 24 hr capsule, Take 180 mg by mouth daily., Disp: , Rfl:  .  fluconazole (DIFLUCAN) 100 MG tablet, Take 1 daily for 10 days (Patient not taking: Reported on 08/23/2019), Disp: 10 tablet, Rfl: 0 .  fluticasone (FLONASE) 50 MCG/ACT nasal spray, Place into both nostrils as needed. , Disp: , Rfl:  .  fluticasone (FLOVENT HFA) 110 MCG/ACT inhaler, Inhale 2 puffs into the lungs 2 (two) times daily., Disp: 1 Inhaler, Rfl: 1 .  hydrochlorothiazide (HYDRODIURIL) 25 MG tablet, Take 1 tablet (25 mg total) by mouth daily. (Patient not taking: Reported on 08/23/2019), Disp: 30 tablet, Rfl: 2 .  ketoconazole (NIZORAL) 2 % cream, Apply 1 application topically daily. Apply to irritated skin when dry.5x/wk (Patient not taking: Reported on 08/23/2019), Disp: 60 g, Rfl: 1 .  metoprolol succinate (TOPROL-XL) 50 MG 24 hr tablet, Take 1 tablet by mouth daily., Disp: , Rfl:  .  Metoprolol-Hydrochlorothiazide 50-12.5 MG TB24, Take by mouth., Disp: , Rfl:  .  omeprazole (PRILOSEC) 20 MG capsule, TAKE 1 CAPSULE BY MOUTH TWICE DAILY 30 MINUTES PRIOR TO A MEAL, Disp: 180 capsule, Rfl: 3 .  oxybutynin (DITROPAN-XL) 5 MG 24 hr tablet, TAKE (1) TABLET BY MOUTH AT BEDTIME., Disp: 90  tablet, Rfl: 3     SLEEP ARCHITECTURE The study was initiated at 10:24:50 PM and ended at 4:39:44 AM.  Sleep onset time was 33.4 minutes and the sleep efficiency was 40.5%%. The total sleep time was 152 minutes.  Stage REM latency was N/A minutes.  The patient spent 25.3%% of the night in stage N1 sleep, 74.7%% in stage N2 sleep, 0.0%% in stage N3 and 0% in REM.  Alpha intrusion was absent.  Supine sleep was  0.00%.  RESPIRATORY PARAMETERS The overall apnea/hypopnea index (AHI) was 37.5 per hour. There were 33 total apneas, including 33 obstructive, 0 central and 0 mixed apneas. There were 62 hypopneas and 3 RERAs.  The AHI during Stage REM sleep was N/A per hour.  AHI while supine was N/A per hour.  The mean oxygen saturation was 91.2%. The minimum SpO2 during sleep was 84.0%.  moderate snoring was noted during this study.  CARDIAC DATA The 2 lead EKG demonstrated sinus rhythm. The mean heart rate was 69.6 beats per minute. Other EKG findings include: PVCs.  LEG MOVEMENT DATA The total PLMS were 0 with a resulting PLMS index of 0.0. Associated arousal with leg movement index was 0.0.  IMPRESSIONS 1.  Moderate to severe obstructive sleep apnea syndrome is documented with this study.  AutoPap 10-20 is recommended. 2.  Abnormal sleep architecture is also documented showing poor sleep efficiency, increased fragmentation, some slow-wave sleep and absent REM sleep.  [name], MD Diplomate, American Board of Sleep Medicine.  ELECTRONICALLY SIGNED ON:  01/28/2020, 9:12 AM [place] PH: [phone]   FX: [phone] [place]

## 2020-03-14 DIAGNOSIS — I25119 Atherosclerotic heart disease of native coronary artery with unspecified angina pectoris: Secondary | ICD-10-CM | POA: Diagnosis not present

## 2020-03-14 DIAGNOSIS — E785 Hyperlipidemia, unspecified: Secondary | ICD-10-CM | POA: Diagnosis not present

## 2020-03-14 DIAGNOSIS — I10 Essential (primary) hypertension: Secondary | ICD-10-CM | POA: Diagnosis not present

## 2020-03-14 DIAGNOSIS — K219 Gastro-esophageal reflux disease without esophagitis: Secondary | ICD-10-CM | POA: Diagnosis not present

## 2020-03-14 DIAGNOSIS — E559 Vitamin D deficiency, unspecified: Secondary | ICD-10-CM | POA: Diagnosis not present

## 2020-03-28 DIAGNOSIS — L280 Lichen simplex chronicus: Secondary | ICD-10-CM | POA: Diagnosis not present

## 2020-03-28 DIAGNOSIS — B078 Other viral warts: Secondary | ICD-10-CM | POA: Diagnosis not present

## 2020-03-28 DIAGNOSIS — L818 Other specified disorders of pigmentation: Secondary | ICD-10-CM | POA: Diagnosis not present

## 2020-04-01 DIAGNOSIS — G4733 Obstructive sleep apnea (adult) (pediatric): Secondary | ICD-10-CM | POA: Diagnosis not present

## 2020-04-02 DIAGNOSIS — R2689 Other abnormalities of gait and mobility: Secondary | ICD-10-CM | POA: Diagnosis not present

## 2020-04-02 DIAGNOSIS — E1142 Type 2 diabetes mellitus with diabetic polyneuropathy: Secondary | ICD-10-CM | POA: Diagnosis not present

## 2020-04-02 DIAGNOSIS — G4733 Obstructive sleep apnea (adult) (pediatric): Secondary | ICD-10-CM | POA: Diagnosis not present

## 2020-04-02 DIAGNOSIS — R519 Headache, unspecified: Secondary | ICD-10-CM | POA: Diagnosis not present

## 2020-04-09 DIAGNOSIS — Z0001 Encounter for general adult medical examination with abnormal findings: Secondary | ICD-10-CM | POA: Diagnosis not present

## 2020-04-09 DIAGNOSIS — E559 Vitamin D deficiency, unspecified: Secondary | ICD-10-CM | POA: Diagnosis not present

## 2020-05-27 ENCOUNTER — Ambulatory Visit: Payer: Medicare Other | Admitting: Gastroenterology

## 2020-05-27 DIAGNOSIS — M79671 Pain in right foot: Secondary | ICD-10-CM | POA: Diagnosis not present

## 2020-05-28 DIAGNOSIS — M79671 Pain in right foot: Secondary | ICD-10-CM | POA: Diagnosis not present

## 2020-05-29 ENCOUNTER — Ambulatory Visit: Payer: Medicare Other | Admitting: Gastroenterology

## 2020-08-08 ENCOUNTER — Other Ambulatory Visit: Payer: Self-pay | Admitting: Obstetrics & Gynecology

## 2020-08-14 DIAGNOSIS — E559 Vitamin D deficiency, unspecified: Secondary | ICD-10-CM | POA: Diagnosis not present

## 2020-08-14 DIAGNOSIS — G4733 Obstructive sleep apnea (adult) (pediatric): Secondary | ICD-10-CM | POA: Diagnosis not present

## 2020-08-14 DIAGNOSIS — R600 Localized edema: Secondary | ICD-10-CM | POA: Diagnosis not present

## 2020-08-14 DIAGNOSIS — R7303 Prediabetes: Secondary | ICD-10-CM | POA: Diagnosis not present

## 2020-08-14 DIAGNOSIS — Z0001 Encounter for general adult medical examination with abnormal findings: Secondary | ICD-10-CM | POA: Diagnosis not present

## 2020-08-21 DIAGNOSIS — G4733 Obstructive sleep apnea (adult) (pediatric): Secondary | ICD-10-CM | POA: Diagnosis not present

## 2020-08-21 DIAGNOSIS — I25119 Atherosclerotic heart disease of native coronary artery with unspecified angina pectoris: Secondary | ICD-10-CM | POA: Diagnosis not present

## 2020-08-21 DIAGNOSIS — J4540 Moderate persistent asthma, uncomplicated: Secondary | ICD-10-CM | POA: Diagnosis not present

## 2020-08-21 DIAGNOSIS — J302 Other seasonal allergic rhinitis: Secondary | ICD-10-CM | POA: Diagnosis not present

## 2020-08-21 DIAGNOSIS — E559 Vitamin D deficiency, unspecified: Secondary | ICD-10-CM | POA: Diagnosis not present

## 2020-08-21 DIAGNOSIS — I10 Essential (primary) hypertension: Secondary | ICD-10-CM | POA: Diagnosis not present

## 2020-08-21 DIAGNOSIS — K219 Gastro-esophageal reflux disease without esophagitis: Secondary | ICD-10-CM | POA: Diagnosis not present

## 2020-08-21 DIAGNOSIS — N3281 Overactive bladder: Secondary | ICD-10-CM | POA: Diagnosis not present

## 2020-08-21 DIAGNOSIS — R7303 Prediabetes: Secondary | ICD-10-CM | POA: Diagnosis not present

## 2020-08-21 DIAGNOSIS — E782 Mixed hyperlipidemia: Secondary | ICD-10-CM | POA: Diagnosis not present

## 2020-08-21 DIAGNOSIS — R21 Rash and other nonspecific skin eruption: Secondary | ICD-10-CM | POA: Diagnosis not present

## 2020-08-21 DIAGNOSIS — R600 Localized edema: Secondary | ICD-10-CM | POA: Diagnosis not present

## 2020-08-28 ENCOUNTER — Emergency Department (HOSPITAL_COMMUNITY)
Admission: EM | Admit: 2020-08-28 | Discharge: 2020-08-28 | Discharge status: Against Medical Advice | Payer: Medicare Other

## 2020-08-28 ENCOUNTER — Other Ambulatory Visit: Payer: Self-pay

## 2020-08-28 DIAGNOSIS — E119 Type 2 diabetes mellitus without complications: Secondary | ICD-10-CM | POA: Diagnosis not present

## 2020-08-29 ENCOUNTER — Other Ambulatory Visit: Payer: Self-pay | Admitting: Obstetrics & Gynecology

## 2020-08-29 DIAGNOSIS — L818 Other specified disorders of pigmentation: Secondary | ICD-10-CM | POA: Diagnosis not present

## 2020-08-29 DIAGNOSIS — L570 Actinic keratosis: Secondary | ICD-10-CM | POA: Diagnosis not present

## 2020-08-29 DIAGNOSIS — B078 Other viral warts: Secondary | ICD-10-CM | POA: Diagnosis not present

## 2020-08-29 DIAGNOSIS — X32XXXD Exposure to sunlight, subsequent encounter: Secondary | ICD-10-CM | POA: Diagnosis not present

## 2020-09-02 ENCOUNTER — Other Ambulatory Visit (HOSPITAL_COMMUNITY): Payer: Self-pay | Admitting: Internal Medicine

## 2020-09-02 DIAGNOSIS — Z13820 Encounter for screening for osteoporosis: Secondary | ICD-10-CM

## 2020-09-02 DIAGNOSIS — Z1231 Encounter for screening mammogram for malignant neoplasm of breast: Secondary | ICD-10-CM

## 2020-09-12 ENCOUNTER — Ambulatory Visit (HOSPITAL_COMMUNITY)
Admission: RE | Admit: 2020-09-12 | Discharge: 2020-09-12 | Disposition: A | Discharge status: Home / Self Care | Payer: Medicare Other | Source: Ambulatory Visit | Attending: Internal Medicine | Admitting: Internal Medicine

## 2020-09-12 ENCOUNTER — Other Ambulatory Visit: Payer: Self-pay

## 2020-09-12 DIAGNOSIS — Z1231 Encounter for screening mammogram for malignant neoplasm of breast: Secondary | ICD-10-CM

## 2020-09-12 DIAGNOSIS — Z13820 Encounter for screening for osteoporosis: Secondary | ICD-10-CM | POA: Diagnosis not present

## 2020-09-12 DIAGNOSIS — Z780 Asymptomatic menopausal state: Secondary | ICD-10-CM | POA: Insufficient documentation

## 2020-09-24 ENCOUNTER — Encounter: Payer: Self-pay | Admitting: Gastroenterology

## 2020-09-24 ENCOUNTER — Other Ambulatory Visit: Payer: Self-pay

## 2020-09-24 ENCOUNTER — Ambulatory Visit (INDEPENDENT_AMBULATORY_CARE_PROVIDER_SITE_OTHER): Payer: Medicare Other | Admitting: Gastroenterology

## 2020-09-24 ENCOUNTER — Telehealth: Payer: Self-pay | Admitting: Gastroenterology

## 2020-09-24 VITALS — BP 156/77 | HR 66 | Temp 97.3°F | Ht 63.0 in | Wt 236.0 lb

## 2020-09-24 DIAGNOSIS — K219 Gastro-esophageal reflux disease without esophagitis: Secondary | ICD-10-CM

## 2020-09-24 DIAGNOSIS — Z86010 Personal history of colonic polyps: Secondary | ICD-10-CM | POA: Diagnosis not present

## 2020-09-24 DIAGNOSIS — Z860101 Personal history of adenomatous and serrated colon polyps: Secondary | ICD-10-CM

## 2020-09-24 NOTE — Progress Notes (Signed)
Primary Care Physician: Celene Squibb, MD  Primary Gastroenterologist:  Formerly Barney Drain, MD   Chief Complaint  Patient presents with  . Gastroesophageal Reflux    Doing ok    HPI: Sandra [name] is a 70 y.o. female here for follow-up.  Last seen in February 2021.  History of chronic GERD, has been on omeprazole 20 mg daily.  Eats a lemon every other day and helps her reflux. Doesn't regurgitate as much now. Still taking omeprazole BID.  States her new provider at Dr. Juel Burrow office encouraged her to talk with [place] about changing her PPI therapy.  Patient states she feels like she can improve her diet somewhat.  Symptoms are usually worse after eating spicy foods.  Going to try an adjust diet instead of change PPI.  States her PCP has encouraged her to clean up her diet because she may have to go on diabetes medications if she does not.  Bowel movements are regular.  No blood in the stool or melena.  No abdominal pain.  Patient states she completed Cologuard about a year ago with PCP and was negative.  EGD in January 2017 with mild nonerosive gastritis, dysphagia due to web and/or uncontrolled GERD, empiric dilation.  Colonoscopy in January 2017 with simple adenomas, left colon redundant, internal hemorrhoids, surveillance in 2020.   Current Outpatient Medications  Medication Sig Dispense Refill  . ACCU-CHEK AVIVA PLUS test strip USE AS DIRECTED ONCE DAILY 50 strip 1  . albuterol (PROVENTIL HFA;VENTOLIN HFA) 108 (90 BASE) MCG/ACT inhaler Inhale 1-2 puffs into the lungs every 6 (six) hours as needed for wheezing or shortness of breath. 1 Inhaler 0  . albuterol (PROVENTIL) (2.5 MG/3ML) 0.083% nebulizer solution Take 2.5 mg by nebulization every 6 (six) hours as needed for wheezing or shortness of breath.    Marland Kitchen aspirin EC 81 MG tablet Take 81 mg by mouth daily.    Marland Kitchen atorvastatin (LIPITOR) 10 MG tablet Take 1 tablet (10 mg total) by mouth daily. 90 tablet 0  . diltiazem  (DILACOR XR) 180 MG 24 hr capsule Take 180 mg by mouth daily.    . fluticasone (FLONASE) 50 MCG/ACT nasal spray Place into both nostrils as needed.     . fluticasone (FLOVENT HFA) 110 MCG/ACT inhaler Inhale 2 puffs into the lungs 2 (two) times daily. 1 Inhaler 1  . hydrochlorothiazide (HYDRODIURIL) 25 MG tablet Take 1 tablet (25 mg total) by mouth daily. 30 tablet 2  . ketoconazole (NIZORAL) 2 % cream Apply 1 application topically daily. Apply to irritated skin when dry.5x/wk 60 g 1  . metoprolol succinate (TOPROL-XL) 50 MG 24 hr tablet Take 1 tablet by mouth daily.    Marland Kitchen omeprazole (PRILOSEC) 20 MG capsule TAKE 1 CAPSULE BY MOUTH TWICE DAILY 30 MINUTES PRIOR TO A MEAL 180 capsule 3  . oxybutynin (DITROPAN-XL) 5 MG 24 hr tablet TAKE (1) TABLET BY MOUTH AT BEDTIME. 90 tablet 3   No current facility-administered medications for this visit.    Allergies as of 09/24/2020 - Review Complete 09/24/2020  Allergen Reaction Noted  . Other Swelling 10/08/2015  . Ciprofloxacin Itching 11/28/2015  . Latex Itching 01/01/2015  . Lisinopril Swelling 03/24/2014  . Penicillins Itching 04/23/2011  . Protonix [pantoprazole sodium]  08/08/2015  . Sulfa antibiotics  03/24/2014    ROS:  General: Negative for anorexia, unintentional weight loss, fever, chills, fatigue, weakness. ENT: Negative for hoarseness, difficulty swallowing , nasal congestion. CV: Negative for chest pain,  angina, palpitations, dyspnea on exertion, peripheral edema.  Respiratory: Negative for dyspnea at rest, dyspnea on exertion, cough, sputum, wheezing.  GI: See history of present illness. GU:  Negative for dysuria, hematuria, urinary incontinence, urinary frequency, nocturnal urination.  Endo: Negative for unusual weight change.    Physical Examination:   BP (!) 156/77   Pulse 66   Temp (!) 97.3 F (36.3 C) (Temporal)   Ht 5\' 3"  (1.6 m)   Wt 236 lb (107 kg)   BMI 41.81 kg/m   General: Well-nourished, well-developed in no  acute distress.  Eyes: No icterus. Mouth: masked Lungs: Clear to auscultation bilaterally.  Heart: Regular rate and rhythm, no murmurs rubs or gallops.  Abdomen: Bowel sounds are normal, nontender, nondistended, no hepatosplenomegaly or masses, no abdominal bruits or hernia , no rebound or guarding.  Exam limited by body habitus. Extremities: No lower extremity edema. No clubbing or deformities. Neuro: Alert and oriented x 4   Skin: Warm and dry, no jaundice.   Psych: Alert and cooperative, normal mood and affect.  Labs:  Requested Imaging Studies: DG BONE DENSITY (DXA)  Result Date: 09/13/2020 EXAM: DUAL X-RAY ABSORPTIOMETRY (DXA) FOR BONE MINERAL DENSITY IMPRESSION: Your patient Sandra [name] completed a BMD test on 09/12/2020 using the [employer] [facility] (software version: 14.10) manufactured by [employer]. The following summarizes the results of our evaluation. Technologist:TNB PATIENT BIOGRAPHICAL: Name: Sandra [name], Sandra [name] Patient ID: [id number] Birth Date: 10/21/1950 Height: 63.0 in. Gender: Female Exam Date: 09/12/2020 Weight: 244.4 lbs. Indications: Post Menopausal, Low Calcium Intake, Partial Hysterectomy Fractures: Treatments: Asprin, Calcium, Vitamin D DENSITOMETRY RESULTS: Site      Region     Measured Date Measured Age WHO Classification Young Adult T-score BMD         %Change vs. Previous Significant Change (*) [facility] Spine L1-L4 09/12/2020 70.8 Normal -0.9 1.075 g/cm2 -4.5% - [facility] Spine L1-L4 02/05/2017 67.2 Normal -0.4 1.126 g/cm2 - - DualFemur Neck Right 09/12/2020 70.8 Normal -0.7 0.938 g/cm2 -3.5% - DualFemur Neck Right 02/05/2017 67.2 Normal -0.5 0.972 g/cm2 - - DualFemur Total Mean 09/12/2020 70.8 Normal 0.4 1.060 g/cm2 -2.1% Yes DualFemur Total Mean 02/05/2017 67.2 Normal 0.6 1.083 g/cm2 - - ASSESSMENT: BMD as determined from [facility] Spine L1-L4 is 1.075 g/cm2 with a T-Score of -0.9. This patient is considered normal according to [facility])  criteria. The scan quality is good. Compared with the prior study on 02/05/2017, the BMD of the lumbar spine and right femoral neck shows no statistically significant change. Compared with the prior study on 02/05/2017, the BMD of the total mean shows a statistically significant decrease. Patient is not a candidate for FRAX assessment due to normal bone density exam. World Health Organization [facility]) criteria for post-menopausal, Caucasian Women: Normal:       T-score at or above -1 SD Osteopenia:   T-score between -1 and -2.5 SD Osteoporosis: T-score at or below -2.5 SD RECOMMENDATIONS: 1. All patients should optimize calcium and vitamin D intake. 2. Consider FDA-approved medical therapies in postmenopausal women and med aged 55 years and older, based on the following: a. A hip or vertebral (clinical or morphometric) fracture b. T-score < -2.5 at the femoral neck or spine after appropriate evaluation to exclude secondary causes c. Low bone mass (T-score between -1.0 and -2.5 at the femoral neck or spine) and a 10-year probability of a hip fracture > 3% or a 10-year probability of a major osteoporosis-related fracture > 20% based on the  [place]-adapted WHO algorithm d. Clinician judgment and/or patient preferences may indicate treatment for people with 10-year fracture probabilities above or below these levels FOLLOW-UP: Patients with diagnosis of osteoporosis or at high risk fracture should have regular bone mineral density tests. For patients eligible for Medicare, routine testing is allowed once every 2 years. Testing frequency can be increased to on year for patients who have rapidly progressing disease, those who are receiving medical therapy to restore bone mass, or have additional risk factors. I have reviewed this report, and agree with the above findings. [facility] Electronically Signed   By: Lowella Grip III M.D.   On: 09/13/2020 07:43   MM 3D SCREEN BREAST BILATERAL  Result Date:  09/16/2020 CLINICAL DATA:  Screening. EXAM: DIGITAL SCREENING BILATERAL MAMMOGRAM WITH TOMOSYNTHESIS AND CAD TECHNIQUE: Bilateral screening digital craniocaudal and mediolateral oblique mammograms were obtained. Bilateral screening digital breast tomosynthesis was performed. The images were evaluated with computer-aided detection. COMPARISON:  Previous exam(s). ACR Breast Density Category b: There are scattered areas of fibroglandular density. FINDINGS: There are no findings suspicious for malignancy. The images were evaluated with computer-aided detection. IMPRESSION: No mammographic evidence of malignancy. A result letter of this screening mammogram will be mailed directly to the patient. RECOMMENDATION: Screening mammogram in one year. (Code:SM-B-01Y) BI-RADS CATEGORY  1: Negative. Electronically Signed   By: Lovey Newcomer M.D.   On: 09/16/2020 10:38   Assessment/plan:  Pleasant 70 year old female presenting for follow-up of GERD, history of adenomatous colon polyps.  Omeprazole 20 mg twice daily does not completely control her symptoms.  She complains of flares when she eats spicy foods.  We discussed potentially trying a different PPI versus reinforced antireflux measures.  Patient would like to change her diet to see if this helps.  She has started eating lemon every other day stating helps with her GERD.  No alarm symptoms at this time.  She was due for colonoscopy for history of adenomatous colon polyps back in January 2020.  Patient never called and scheduled.  She states she subsequently completed Cologuard with her PCP which was negative.  Discussed use of Cologuard is for average risk individuals given her history of adenomatous colon polyps would have been more appropriate based on guidelines.  1. Request copy of latest labs, OV visit from PCP, Cologuard results. 2. Continue omeprazole 20 mg twice daily.  She will call if she wants to change PPI therapy. 3. Reinforced antireflux  measures. 4. Return office visit in 1 year.

## 2020-09-24 NOTE — Progress Notes (Signed)
CC'ED TO PCP 

## 2020-09-24 NOTE — Telephone Encounter (Signed)
Please schedule or NIC for OV in 1 year for follow up GERD.

## 2020-09-24 NOTE — Patient Instructions (Signed)
1. Continue omeprazole twice daily before a meal. 2. Attention to your diet, try to avoid spicy foods, greasy and fatty foods.  These will worsen your reflux symptoms.  See handout below as well.  If you decide you would like me to switch your reflux medication, please let me know. 3. I will get a copy of your recent labs and Cologuard results.  We will be in touch with regards to further recommendations for colonoscopy in the future.

## 2020-10-04 DIAGNOSIS — Z20822 Contact with and (suspected) exposure to covid-19: Secondary | ICD-10-CM | POA: Diagnosis not present

## 2020-11-23 ENCOUNTER — Other Ambulatory Visit: Payer: Self-pay | Admitting: Gastroenterology

## 2020-12-12 DIAGNOSIS — Z20822 Contact with and (suspected) exposure to covid-19: Secondary | ICD-10-CM | POA: Diagnosis not present

## 2021-01-14 ENCOUNTER — Telehealth: Payer: Self-pay | Admitting: Gastroenterology

## 2021-01-14 NOTE — Telephone Encounter (Signed)
Please let pt know that I received copy of old labs from PCP. Iron, ferritin normal, fe sat low at 24. Dated 11/2019.   They also sent copy of ifobt which was neg. NOT A COLOGUARD.  She is overdue surveillance colonoscopy for h/o polyps. Was due in 2020.   IF SHE IS INTERESTED IN PURSUING COLONOSCOPY THEN WE SHOULD GET HER BACK INTO THE OFFICE TO DISCUSS.

## 2021-01-16 NOTE — Telephone Encounter (Signed)
Phoned and spoke with the pt advised of your review of her labs, ifobt and that she was overdue for a surveillance colonoscopy. Pt stated that when she comes in for her next office visit she will address the issue of the colonoscopy then.

## 2021-02-19 DIAGNOSIS — R7303 Prediabetes: Secondary | ICD-10-CM | POA: Diagnosis not present

## 2021-02-19 DIAGNOSIS — E538 Deficiency of other specified B group vitamins: Secondary | ICD-10-CM | POA: Diagnosis not present

## 2021-02-19 DIAGNOSIS — I10 Essential (primary) hypertension: Secondary | ICD-10-CM | POA: Diagnosis not present

## 2021-02-19 DIAGNOSIS — E559 Vitamin D deficiency, unspecified: Secondary | ICD-10-CM | POA: Diagnosis not present

## 2021-02-19 DIAGNOSIS — Z7689 Persons encountering health services in other specified circumstances: Secondary | ICD-10-CM | POA: Diagnosis not present

## 2021-02-24 ENCOUNTER — Ambulatory Visit (HOSPITAL_COMMUNITY)
Admission: RE | Admit: 2021-02-24 | Discharge: 2021-02-24 | Disposition: A | Discharge status: Home / Self Care | Payer: Medicare Other | Source: Ambulatory Visit | Attending: Family Medicine | Admitting: Family Medicine

## 2021-02-24 ENCOUNTER — Other Ambulatory Visit: Payer: Self-pay

## 2021-02-24 ENCOUNTER — Other Ambulatory Visit (HOSPITAL_COMMUNITY): Payer: Self-pay | Admitting: Family Medicine

## 2021-02-24 DIAGNOSIS — J4550 Severe persistent asthma, uncomplicated: Secondary | ICD-10-CM | POA: Diagnosis not present

## 2021-02-24 DIAGNOSIS — R7303 Prediabetes: Secondary | ICD-10-CM | POA: Diagnosis not present

## 2021-02-24 DIAGNOSIS — K219 Gastro-esophageal reflux disease without esophagitis: Secondary | ICD-10-CM | POA: Diagnosis not present

## 2021-02-24 DIAGNOSIS — M79604 Pain in right leg: Secondary | ICD-10-CM | POA: Diagnosis not present

## 2021-02-24 DIAGNOSIS — E782 Mixed hyperlipidemia: Secondary | ICD-10-CM | POA: Diagnosis not present

## 2021-02-24 DIAGNOSIS — I25119 Atherosclerotic heart disease of native coronary artery with unspecified angina pectoris: Secondary | ICD-10-CM | POA: Diagnosis not present

## 2021-02-24 DIAGNOSIS — Z0001 Encounter for general adult medical examination with abnormal findings: Secondary | ICD-10-CM | POA: Diagnosis not present

## 2021-02-24 DIAGNOSIS — E559 Vitamin D deficiency, unspecified: Secondary | ICD-10-CM | POA: Diagnosis not present

## 2021-02-24 DIAGNOSIS — J302 Other seasonal allergic rhinitis: Secondary | ICD-10-CM | POA: Diagnosis not present

## 2021-02-24 DIAGNOSIS — I10 Essential (primary) hypertension: Secondary | ICD-10-CM | POA: Diagnosis not present

## 2021-02-24 DIAGNOSIS — N3281 Overactive bladder: Secondary | ICD-10-CM | POA: Diagnosis not present

## 2021-02-24 DIAGNOSIS — M1711 Unilateral primary osteoarthritis, right knee: Secondary | ICD-10-CM | POA: Diagnosis not present

## 2021-02-24 DIAGNOSIS — N1831 Chronic kidney disease, stage 3a: Secondary | ICD-10-CM | POA: Diagnosis not present

## 2021-02-25 ENCOUNTER — Other Ambulatory Visit (HOSPITAL_COMMUNITY): Payer: Self-pay | Admitting: Family Medicine

## 2021-02-25 DIAGNOSIS — Z13820 Encounter for screening for osteoporosis: Secondary | ICD-10-CM

## 2021-03-24 ENCOUNTER — Telehealth: Payer: Self-pay | Admitting: Internal Medicine

## 2021-03-24 NOTE — Telephone Encounter (Signed)
Pt called to schedule a colonoscopy. Does she need to be triaged or OV?

## 2021-08-26 DIAGNOSIS — I10 Essential (primary) hypertension: Secondary | ICD-10-CM | POA: Diagnosis not present

## 2021-08-26 DIAGNOSIS — E559 Vitamin D deficiency, unspecified: Secondary | ICD-10-CM | POA: Diagnosis not present

## 2021-08-26 DIAGNOSIS — R7303 Prediabetes: Secondary | ICD-10-CM | POA: Diagnosis not present

## 2021-09-18 ENCOUNTER — Encounter: Payer: Self-pay | Admitting: Internal Medicine

## 2021-09-22 DIAGNOSIS — H5711 Ocular pain, right eye: Secondary | ICD-10-CM | POA: Diagnosis not present

## 2021-09-25 DIAGNOSIS — E559 Vitamin D deficiency, unspecified: Secondary | ICD-10-CM | POA: Diagnosis not present

## 2021-09-25 DIAGNOSIS — I10 Essential (primary) hypertension: Secondary | ICD-10-CM | POA: Diagnosis not present

## 2021-09-25 DIAGNOSIS — I25119 Atherosclerotic heart disease of native coronary artery with unspecified angina pectoris: Secondary | ICD-10-CM | POA: Diagnosis not present

## 2021-09-25 DIAGNOSIS — J4550 Severe persistent asthma, uncomplicated: Secondary | ICD-10-CM | POA: Diagnosis not present

## 2021-09-25 DIAGNOSIS — N1831 Chronic kidney disease, stage 3a: Secondary | ICD-10-CM | POA: Diagnosis not present

## 2021-09-25 DIAGNOSIS — J302 Other seasonal allergic rhinitis: Secondary | ICD-10-CM | POA: Diagnosis not present

## 2021-09-25 DIAGNOSIS — G4733 Obstructive sleep apnea (adult) (pediatric): Secondary | ICD-10-CM | POA: Diagnosis not present

## 2021-09-25 DIAGNOSIS — N3281 Overactive bladder: Secondary | ICD-10-CM | POA: Diagnosis not present

## 2021-09-25 DIAGNOSIS — K219 Gastro-esophageal reflux disease without esophagitis: Secondary | ICD-10-CM | POA: Diagnosis not present

## 2021-09-25 DIAGNOSIS — E782 Mixed hyperlipidemia: Secondary | ICD-10-CM | POA: Diagnosis not present

## 2021-09-25 DIAGNOSIS — R7303 Prediabetes: Secondary | ICD-10-CM | POA: Diagnosis not present

## 2021-09-26 ENCOUNTER — Other Ambulatory Visit (HOSPITAL_COMMUNITY): Payer: Self-pay | Admitting: Internal Medicine

## 2021-09-26 DIAGNOSIS — Z1231 Encounter for screening mammogram for malignant neoplasm of breast: Secondary | ICD-10-CM

## 2021-10-01 ENCOUNTER — Ambulatory Visit (HOSPITAL_COMMUNITY)
Admission: RE | Admit: 2021-10-01 | Discharge: 2021-10-01 | Disposition: A | Discharge status: Home / Self Care | Payer: Medicare Other | Source: Ambulatory Visit | Attending: Internal Medicine | Admitting: Internal Medicine

## 2021-10-01 DIAGNOSIS — Z1231 Encounter for screening mammogram for malignant neoplasm of breast: Secondary | ICD-10-CM

## 2021-10-02 DIAGNOSIS — Z961 Presence of intraocular lens: Secondary | ICD-10-CM | POA: Diagnosis not present

## 2021-10-02 DIAGNOSIS — E119 Type 2 diabetes mellitus without complications: Secondary | ICD-10-CM | POA: Diagnosis not present

## 2021-12-09 ENCOUNTER — Ambulatory Visit: Payer: Medicare Other | Admitting: Gastroenterology

## 2021-12-23 ENCOUNTER — Other Ambulatory Visit: Payer: Self-pay | Admitting: Gastroenterology

## 2022-03-24 DIAGNOSIS — R7303 Prediabetes: Secondary | ICD-10-CM | POA: Diagnosis not present

## 2022-03-24 DIAGNOSIS — E782 Mixed hyperlipidemia: Secondary | ICD-10-CM | POA: Diagnosis not present

## 2022-03-24 DIAGNOSIS — I10 Essential (primary) hypertension: Secondary | ICD-10-CM | POA: Diagnosis not present

## 2022-03-24 DIAGNOSIS — E559 Vitamin D deficiency, unspecified: Secondary | ICD-10-CM | POA: Diagnosis not present

## 2022-03-30 DIAGNOSIS — K219 Gastro-esophageal reflux disease without esophagitis: Secondary | ICD-10-CM | POA: Diagnosis not present

## 2022-03-30 DIAGNOSIS — E782 Mixed hyperlipidemia: Secondary | ICD-10-CM | POA: Diagnosis not present

## 2022-03-30 DIAGNOSIS — N1831 Chronic kidney disease, stage 3a: Secondary | ICD-10-CM | POA: Diagnosis not present

## 2022-03-30 DIAGNOSIS — E559 Vitamin D deficiency, unspecified: Secondary | ICD-10-CM | POA: Diagnosis not present

## 2022-03-30 DIAGNOSIS — Z0001 Encounter for general adult medical examination with abnormal findings: Secondary | ICD-10-CM | POA: Diagnosis not present

## 2022-03-30 DIAGNOSIS — R7303 Prediabetes: Secondary | ICD-10-CM | POA: Diagnosis not present

## 2022-03-30 DIAGNOSIS — I25119 Atherosclerotic heart disease of native coronary artery with unspecified angina pectoris: Secondary | ICD-10-CM | POA: Diagnosis not present

## 2022-03-30 DIAGNOSIS — I10 Essential (primary) hypertension: Secondary | ICD-10-CM | POA: Diagnosis not present

## 2022-03-30 DIAGNOSIS — Z23 Encounter for immunization: Secondary | ICD-10-CM | POA: Diagnosis not present

## 2022-03-30 DIAGNOSIS — G4733 Obstructive sleep apnea (adult) (pediatric): Secondary | ICD-10-CM | POA: Diagnosis not present

## 2022-03-30 DIAGNOSIS — J4550 Severe persistent asthma, uncomplicated: Secondary | ICD-10-CM | POA: Diagnosis not present

## 2022-03-30 DIAGNOSIS — J302 Other seasonal allergic rhinitis: Secondary | ICD-10-CM | POA: Diagnosis not present

## 2022-04-15 ENCOUNTER — Other Ambulatory Visit: Payer: Self-pay | Admitting: Gastroenterology

## 2022-06-03 ENCOUNTER — Encounter: Payer: Self-pay | Admitting: Internal Medicine

## 2022-06-03 ENCOUNTER — Ambulatory Visit (INDEPENDENT_AMBULATORY_CARE_PROVIDER_SITE_OTHER): Payer: Medicare Other | Admitting: Internal Medicine

## 2022-06-03 VITALS — BP 131/69 | HR 68 | Temp 97.3°F | Ht 63.0 in | Wt 235.3 lb

## 2022-06-03 DIAGNOSIS — K219 Gastro-esophageal reflux disease without esophagitis: Secondary | ICD-10-CM

## 2022-06-03 DIAGNOSIS — D123 Benign neoplasm of transverse colon: Secondary | ICD-10-CM

## 2022-06-03 NOTE — Patient Instructions (Signed)
We will schedule you for colonoscopy given your history of polyps prior.  If everything looks good, this will likely be here last colonoscopy for colon cancer screening purposes.  Continue on omeprazole twice daily for your chronic reflux.  It was very nice meeting you today.  Dr. Abbey Chatters

## 2022-06-03 NOTE — Progress Notes (Signed)
Referring Provider: Celene Squibb, MD Primary Care Physician:  Celene Squibb, MD Primary GI:  Dr. Abbey Chatters  Chief Complaint  Patient presents with   Follow-up    Patient here today for a follow up appointment due to [facility].Patient is taking Omeprazole 20 mg bid. She says this is controlling her symptoms.    HPI:   Sandra [name] is a 71 y.o. female who presents to the clinic today for yearly follow-up visit.  History of chronic GERD well-controlled on omeprazole 20 mg twice daily.  Denies any dysphagia odynophagia.  No epigastric or chest pain.  No melena hematochezia.  EGD in January 2017 with mild nonerosive gastritis, dysphagia due to web and/or uncontrolled GERD, empiric dilation.   Colonoscopy in January 2017 with simple adenomas, left colon redundant, internal hemorrhoids, surveillance in 2020.  Past Medical History:  Diagnosis Date   Acid reflux 2000   HA with nexium    Anemia    Angio-edema    caused by Lisinopril   Arthritis    Chest pain, unspecified 03/25/2014   MI ruled out. CTA negative PE   Diabetes mellitus 2012   borderline diabetes, never on medicine    Enteritis 04/23/2011   Hypercholesteremia 2011   Hypertension 1990s    Ileitis 03/24/2014   Moderate persistent asthma in adult without complication 12/24/3084   Shortness of breath dyspnea    Sleep apnea     Past Surgical History:  Procedure Laterality Date   ABDOMINAL HYSTERECTOMY  1983    CATARACT EXTRACTION W/PHACO Right 01/01/2015   Procedure: CATARACT EXTRACTION PHACO AND INTRAOCULAR LENS PLACEMENT ([place]);  Surgeon: Rutherford Guys, MD;  Location: [facility];  Service: Ophthalmology;  Laterality: Right;  CDE:5.11   CATARACT EXTRACTION W/PHACO Left 01/15/2015   Procedure: CATARACT EXTRACTION PHACO AND INTRAOCULAR LENS PLACEMENT (IOC);  Surgeon: Rutherford Guys, MD;  Location: [facility];  Service: Ophthalmology;  Laterality: Left;  CDE:4.11   COLONOSCOPY N/A 07/22/2015   Dr. Oneida Alar: simple adenomas, left colon  redundant, internal hemorrhoids, surveillance 2020   ESOPHAGOGASTRODUODENOSCOPY N/A 07/22/2015   mild non-erosive gastritis, dysphagia due to web and/or uncontrolled GERD, empiric dilation   HEMORRHOID SURGERY N/A 03/16/2016   Procedure: EXTENSIVE HEMORRHOIDECTOMY;  Surgeon: Aviva Signs, MD;  Location: [facility];  Service: General;  Laterality: N/A;   [place] N/A 07/22/2015   Procedure: SAVORY DILATION;  Surgeon: Danie Binder, MD;  Location: [facility] SUITE;  Service: Endoscopy;  Laterality: N/A;   TONSILLECTOMY     TUBAL LIGATION  1983     Current Outpatient Medications  Medication Sig Dispense Refill   ACCU-CHEK AVIVA PLUS test strip USE AS DIRECTED ONCE DAILY 50 strip 1   albuterol (PROVENTIL HFA;VENTOLIN HFA) 108 (90 BASE) MCG/ACT inhaler Inhale 1-2 puffs into the lungs every 6 (six) hours as needed for wheezing or shortness of breath. 1 Inhaler 0   albuterol (PROVENTIL) (2.5 MG/3ML) 0.083% nebulizer solution Take 2.5 mg by nebulization every 6 (six) hours as needed for wheezing or shortness of breath.     aspirin EC 81 MG tablet Take 81 mg by mouth daily.     atorvastatin (LIPITOR) 10 MG tablet Take 1 tablet (10 mg total) by mouth daily. 90 tablet 0   diltiazem (DILACOR XR) 180 MG 24 hr capsule Take 180 mg by mouth daily.     hydrochlorothiazide (HYDRODIURIL) 25 MG tablet Take 1 tablet (25 mg total) by mouth daily. 30 tablet 2  ketoconazole (NIZORAL) 2 % cream Apply 1 application topically daily. Apply to irritated skin when dry.5x/wk 60 g 1   metoprolol succinate (TOPROL-XL) 50 MG 24 hr tablet Take 1 tablet by mouth daily.     omeprazole (PRILOSEC) 20 MG capsule TAKE 1 CAPSULE BY MOUTH TWICE DAILY 30 MINUTES PRIOR TO A MEAL. WILL NEED APPOINTMENT FOR FURTHER REFILLS. 180 capsule 0   oxybutynin (DITROPAN-XL) 5 MG 24 hr tablet TAKE (1) TABLET BY MOUTH AT BEDTIME. 90 tablet 3   No current facility-administered medications for this visit.    Allergies  as of 06/03/2022 - Review Complete 06/03/2022  Allergen Reaction Noted   Other Swelling 10/08/2015   Ciprofloxacin Itching 11/28/2015   Latex Itching 01/01/2015   Lisinopril Swelling 03/24/2014   Penicillins Itching 04/23/2011   Protonix [pantoprazole sodium]  08/08/2015   Sulfa antibiotics  03/24/2014    Family History  Problem Relation Age of Onset   Diabetes Mother    Heart disease Mother    Stroke Mother    Cancer Daughter 47       breast    Allergic rhinitis Daughter    Allergic rhinitis Son    Seizures Son    Diabetes Maternal Aunt    Cancer Maternal Aunt 60       COLON CANCER   Diabetes Maternal Uncle    Asthma Brother    Eczema Brother    Asthma Grandchild    Diabetes Sister    Hypertension Sister    Kidney disease Sister    Emphysema Brother        smoked   Angioedema Neg Hx    Urticaria Neg Hx    Immunodeficiency Neg Hx     Social History   Socioeconomic History   Marital status: Divorced    Spouse name: Not on file   Number of children: 3    Years of education: some colle   Highest education level: Not on file  Occupational History   Occupation: Retired     Comment: Former CNA   Tobacco Use   Smoking status: Never   Smokeless tobacco: Never  Scientific laboratory technician Use: Never used  Substance and Sexual Activity   Alcohol use: No   Drug use: No   Sexual activity: Not Currently    Birth control/protection: Surgical    Comment: hyst  Other Topics Concern   Not on file  Social History Narrative   3 children-2 living, sons. Lives alone. 7 grandchildren. 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    Social Determinants of Health   Financial Resource Strain: Not on file  Food Insecurity: Not on file  Transportation Needs: Not on file  Physical Activity: Not on file  Stress: Not on file  Social Connections: Not on file    Subjective: Review of Systems  Constitutional:  Negative for chills and fever.  HENT:  Negative for  congestion and hearing loss.   Eyes:  Negative for blurred vision and double vision.  Respiratory:  Negative for cough and shortness of breath.   Cardiovascular:  Negative for chest pain and palpitations.  Gastrointestinal:  Positive for heartburn. Negative for abdominal pain, blood in stool, constipation, diarrhea, melena and vomiting.  Genitourinary:  Negative for dysuria and urgency.  Musculoskeletal:  Negative for joint pain and myalgias.  Skin:  Negative for itching and rash.  Neurological:  Negative for dizziness and headaches.  Psychiatric/Behavioral:  Negative for depression. The patient is not  nervous/anxious.      Objective: BP 131/69 (BP Location: Left Arm, Patient Position: Sitting, Cuff Size: Large)   Pulse 68   Temp (!) 97.3 F (36.3 C) (Temporal)   Ht '5\' 3"'$  (1.6 m)   Wt 235 lb 4.8 oz (106.7 kg)   BMI 41.68 kg/m  Physical Exam Constitutional:      Appearance: Normal appearance. She is obese.  HENT:     Head: Normocephalic and atraumatic.  Eyes:     Extraocular Movements: Extraocular movements intact.     Conjunctiva/sclera: Conjunctivae normal.  Cardiovascular:     Rate and Rhythm: Normal rate and regular rhythm.  Pulmonary:     Effort: Pulmonary effort is normal.     Breath sounds: Normal breath sounds.  Abdominal:     General: Bowel sounds are normal.     Palpations: Abdomen is soft.  Musculoskeletal:        General: No swelling. Normal range of motion.     Cervical back: Normal range of motion and neck supple.  Skin:    General: Skin is warm and dry.     Coloration: Skin is not jaundiced.  Neurological:     General: No focal deficit present.     Mental Status: She is alert and oriented to person, place, and time.  Psychiatric:        Mood and Affect: Mood normal.        Behavior: Behavior normal.      Assessment: *Chronic GERD-well-controlled on omeprazole twice daily *Adenomatous colon polyps *Morbid obesity  Plan: Chronic GERD  well-controlled on omeprazole twice daily.  We will continue.  Patient due for surveillance colonoscopy today given her history of adenomatous colon polyps prior.  We will schedule today. The risks including infection, bleed, or perforation as well as benefits, limitations, alternatives and imponderables have been reviewed with the patient. Questions have been answered. All parties agreeable.  Recommend 1-2# weight loss per week until ideal body weight through exercise & diet. Low fat/cholesterol diet.   Avoid sweets, sodas, fruit juices, sweetened beverages like tea, etc. Gradually increase exercise from 15 min daily up to 1 hr per day 5 days/week.  Follow-up in 1 year  06/03/2022 12:06 PM   Disclaimer: This note was dictated with voice recognition software. Similar sounding words can inadvertently be transcribed and may not be corrected upon review.

## 2022-06-04 ENCOUNTER — Encounter (INDEPENDENT_AMBULATORY_CARE_PROVIDER_SITE_OTHER): Payer: Self-pay | Admitting: *Deleted

## 2022-06-04 MED ORDER — PEG 3350-KCL-NA BICARB-NACL 420 G PO SOLR: 4000.0000 mL | Freq: Once | ORAL | 0 refills | Status: AC

## 2022-07-06 ENCOUNTER — Emergency Department (HOSPITAL_COMMUNITY): Payer: Medicare Other

## 2022-07-06 ENCOUNTER — Emergency Department (HOSPITAL_COMMUNITY)
Admission: EM | Admit: 2022-07-06 | Discharge: 2022-07-06 | Disposition: A | Discharge status: Home / Self Care | Payer: Medicare Other | Attending: Emergency Medicine | Admitting: Emergency Medicine

## 2022-07-06 ENCOUNTER — Encounter (HOSPITAL_COMMUNITY): Payer: Self-pay | Admitting: Emergency Medicine

## 2022-07-06 ENCOUNTER — Other Ambulatory Visit: Payer: Self-pay

## 2022-07-06 DIAGNOSIS — Z7951 Long term (current) use of inhaled steroids: Secondary | ICD-10-CM | POA: Diagnosis not present

## 2022-07-06 DIAGNOSIS — Z79899 Other long term (current) drug therapy: Secondary | ICD-10-CM | POA: Insufficient documentation

## 2022-07-06 DIAGNOSIS — Z1152 Encounter for screening for COVID-19: Secondary | ICD-10-CM | POA: Diagnosis not present

## 2022-07-06 DIAGNOSIS — R0602 Shortness of breath: Secondary | ICD-10-CM | POA: Diagnosis not present

## 2022-07-06 DIAGNOSIS — Z7982 Long term (current) use of aspirin: Secondary | ICD-10-CM | POA: Diagnosis not present

## 2022-07-06 DIAGNOSIS — J441 Chronic obstructive pulmonary disease with (acute) exacerbation: Secondary | ICD-10-CM | POA: Diagnosis not present

## 2022-07-06 DIAGNOSIS — R059 Cough, unspecified: Secondary | ICD-10-CM | POA: Diagnosis not present

## 2022-07-06 LAB — CBC WITH DIFFERENTIAL/PLATELET
Abs Immature Granulocytes: 0.02 10*3/uL (ref 0.00–0.07)
Basophils Absolute: 0.1 10*3/uL (ref 0.0–0.1)
Basophils Relative: 1 %
Eosinophils Absolute: 0.2 10*3/uL (ref 0.0–0.5)
Eosinophils Relative: 3 %
HCT: 36.1 % (ref 36.0–46.0)
Hemoglobin: 11.2 g/dL — ABNORMAL LOW (ref 12.0–15.0)
Immature Granulocytes: 0 %
Lymphocytes Relative: 29 %
Lymphs Abs: 2.1 10*3/uL (ref 0.7–4.0)
MCH: 25.8 pg — ABNORMAL LOW (ref 26.0–34.0)
MCHC: 31 g/dL (ref 30.0–36.0)
MCV: 83.2 fL (ref 80.0–100.0)
Monocytes Absolute: 0.4 10*3/uL (ref 0.1–1.0)
Monocytes Relative: 6 %
Neutro Abs: 4.5 10*3/uL (ref 1.7–7.7)
Neutrophils Relative %: 61 %
Platelets: 317 10*3/uL (ref 150–400)
RBC: 4.34 MIL/uL (ref 3.87–5.11)
RDW: 14.6 % (ref 11.5–15.5)
WBC: 7.3 10*3/uL (ref 4.0–10.5)
nRBC: 0 % (ref 0.0–0.2)

## 2022-07-06 LAB — BASIC METABOLIC PANEL
Anion gap: 11 (ref 5–15)
BUN: 15 mg/dL (ref 8–23)
CO2: 30 mmol/L (ref 22–32)
Calcium: 9.1 mg/dL (ref 8.9–10.3)
Chloride: 97 mmol/L — ABNORMAL LOW (ref 98–111)
Creatinine, Ser: 0.97 mg/dL (ref 0.44–1.00)
GFR, Estimated: >60 mL/min (ref >60)
Glucose, Bld: 107 mg/dL — ABNORMAL HIGH (ref 70–99)
Potassium: 3.6 mmol/L (ref 3.5–5.1)
Sodium: 138 mmol/L (ref 135–145)

## 2022-07-06 LAB — RESP PANEL BY RT-PCR (RSV, FLU A&B, COVID)  RVPGX2
Influenza A by PCR: NEGATIVE
Influenza B by PCR: NEGATIVE
Resp Syncytial Virus by PCR: NEGATIVE
SARS Coronavirus 2 by RT PCR: NEGATIVE

## 2022-07-06 MED ORDER — PREDNISONE 20 MG PO TABS: ORAL_TABLET | ORAL | 0 refills | Status: DC

## 2022-07-06 MED ORDER — PREDNISONE 50 MG PO TABS
60.0000 mg | ORAL_TABLET | Freq: Once | ORAL | Status: AC
  Administered 2022-07-06: 60 mg via ORAL
  Filled 2022-07-06: qty 1

## 2022-07-06 MED ORDER — ALBUTEROL SULFATE (2.5 MG/3ML) 0.083% IN NEBU
INHALATION_SOLUTION | RESPIRATORY_TRACT | Status: AC
  Administered 2022-07-06: 5 mg
  Filled 2022-07-06: qty 6

## 2022-07-06 MED ORDER — ALBUTEROL SULFATE HFA 108 (90 BASE) MCG/ACT IN AERS
2.0000 | INHALATION_SPRAY | RESPIRATORY_TRACT | Status: DC | PRN
  Administered 2022-07-06: 2 via RESPIRATORY_TRACT
  Filled 2022-07-06: qty 6.7

## 2022-07-06 NOTE — ED Provider Notes (Signed)
[facility] EMERGENCY DEPARTMENT Provider Note   CSN: [id number] Arrival date & time: 07/06/22  1405     History {Add pertinent medical, surgical, social history, OB history to HPI:1} Chief Complaint  Patient presents with   Shortness of Breath    Sandra [name] is a 72 y.o. female.  Patient has a history of COPD.  She complains of cough and shortness of breath   Shortness of Breath      Home Medications Prior to Admission medications   Medication Sig Start Date End Date Taking? Authorizing Provider  predniSONE (DELTASONE) 20 MG tablet 2 tabs po daily x 3 days 07/06/22  Yes Milton Ferguson, MD  ACCU-CHEK AVIVA PLUS test strip USE AS DIRECTED ONCE DAILY 08/30/20   Florian Buff, MD  albuterol (PROVENTIL HFA;VENTOLIN HFA) 108 (90 BASE) MCG/ACT inhaler Inhale 1-2 puffs into the lungs every 6 (six) hours as needed for wheezing or shortness of breath. 05/03/15   Tanna Furry, MD  aspirin EC 81 MG tablet Take 81 mg by mouth daily.    [provider]  atorvastatin (LIPITOR) 10 MG tablet Take 1 tablet (10 mg total) by mouth daily. 12/04/14   Funches, Adriana Mccallum, MD  Cholecalciferol (VITAMIN D-3) 125 MCG (5000 UT) TABS Take 5,000 Int'l Units by mouth daily.    [provider]  diltiazem (CARDIZEM CD) 180 MG 24 hr capsule Take 180 mg by mouth every morning. 06/04/22   [provider]  hydrochlorothiazide (HYDRODIURIL) 25 MG tablet Take 1 tablet (25 mg total) by mouth daily. 11/22/14   Funches, Adriana Mccallum, MD  metoprolol succinate (TOPROL-XL) 50 MG 24 hr tablet Take 50 mg by mouth daily. 11/04/17   [provider]  olopatadine (PATADAY) 0.1 % ophthalmic solution Place 1 drop into both eyes daily as needed (Itching).    [provider]  omeprazole (PRILOSEC) 20 MG capsule TAKE 1 CAPSULE BY MOUTH TWICE DAILY 30 MINUTES PRIOR TO A MEAL. WILL NEED APPOINTMENT FOR FURTHER REFILLS. 04/15/22   Mahala Menghini, PA-C  oxybutynin (DITROPAN-XL) 5 MG 24 hr tablet  TAKE (1) TABLET BY MOUTH AT BEDTIME. 08/09/20   Florian Buff, MD  Polyethyl Glycol-Propyl Glycol (SYSTANE ULTRA) 0.4-0.3 % SOLN Place 1 drop into both eyes daily as needed (Irritation and itching).    [provider]      Allergies    Other, Ciprofloxacin, Latex, Lisinopril, Penicillins, Protonix [pantoprazole sodium], and Sulfa antibiotics    Review of Systems   Review of Systems  Respiratory:  Positive for shortness of breath.     Physical Exam Updated Vital Signs BP 135/83   Pulse 79   Temp 98.2 F (36.8 C)   Resp 14   SpO2 99%  Physical Exam  ED Results / Procedures / Treatments   Labs (all labs ordered are listed, but only abnormal results are displayed) Labs Reviewed  CBC WITH DIFFERENTIAL/PLATELET - Abnormal; Notable for the following components:      Result Value   Hemoglobin 11.2 (*)    MCH 25.8 (*)    All other components within normal limits  BASIC METABOLIC PANEL - Abnormal; Notable for the following components:   Chloride 97 (*)    Glucose, Bld 107 (*)    All other components within normal limits  RESP PANEL BY RT-PCR (RSV, FLU A&B, COVID)  [id number]    EKG None  Radiology DG Chest 2 View  Result Date: 07/06/2022 CLINICAL DATA:  Shortness of breath and cough EXAM: CHEST -  2 VIEW COMPARISON:  04/22/2016 and prior radiographs FINDINGS: The cardiomediastinal silhouette is unremarkable. Mild RIGHT hemidiaphragm elevation again noted. There is no evidence of focal airspace disease, pulmonary edema, suspicious pulmonary nodule/mass, pleural effusion, or pneumothorax. No acute bony abnormalities are identified. IMPRESSION: No active cardiopulmonary disease. Electronically Signed   By: Margarette Canada M.D.   On: 07/06/2022 15:46    Procedures Procedures  {Document cardiac monitor, telemetry assessment procedure when appropriate:1}  Medications Ordered in ED Medications  albuterol (VENTOLIN HFA) 108 (90 Base) MCG/ACT inhaler 2 puff (2 puffs Inhalation Given  07/06/22 1502)  predniSONE (DELTASONE) tablet 60 mg (60 mg Oral Given 07/06/22 1559)  albuterol (PROVENTIL) (2.5 MG/3ML) 0.083% nebulizer solution (5 mg  Given 07/06/22 1653)    ED Course/ Medical Decision Making/ A&P                           Medical Decision Making Amount and/or Complexity of Data Reviewed Labs: ordered. Radiology: ordered.  Risk Prescription drug management.   Patient with COPD exacerbation.  Patient improved with neb treatment will be discharged with an albuterol inhaler and prednisone for 3 days  {Document critical care time when appropriate:1} {Document review of labs and clinical decision tools ie heart score, Chads2Vasc2 etc:1}  {Document your independent review of radiology images, and any outside records:1} {Document your discussion with family members, caretakers, and with consultants:1} {Document social determinants of health affecting pt's care:1} {Document your decision making why or why not admission, treatments were needed:1} Final Clinical Impression(s) / ED Diagnoses Final diagnoses:  COPD exacerbation ([place])    Rx / DC Orders ED Discharge Orders          Ordered    predniSONE (DELTASONE) 20 MG tablet        07/06/22 1738

## 2022-07-06 NOTE — ED Notes (Signed)
RT called to assess pt. No respiratory distress noted, but pt is audibly wheezing.

## 2022-07-06 NOTE — ED Triage Notes (Signed)
Pt c/o cough  and congestion x 3 days. Ran out of inhaler. Audible wheezing noted. Nad in triage. Mild labored breathing but no  resp distress note.

## 2022-07-06 NOTE — Discharge Instructions (Signed)
Use your inhaler every 4-6 hours as needed and follow-up with your family doctor this week for recheck

## 2022-07-07 ENCOUNTER — Telehealth: Payer: Self-pay | Admitting: *Deleted

## 2022-07-07 NOTE — Telephone Encounter (Signed)
PA approved via [facility]. Auth# [id number], DOS: Jul 13, 2022 - Oct 11, 2022

## 2022-07-08 ENCOUNTER — Encounter (HOSPITAL_COMMUNITY): Payer: Self-pay

## 2022-07-08 ENCOUNTER — Encounter (HOSPITAL_COMMUNITY)
Admission: RE | Admit: 2022-07-08 | Discharge: 2022-07-08 | Disposition: A | Discharge status: Home / Self Care | Payer: Medicare Other | Source: Ambulatory Visit | Attending: Internal Medicine | Admitting: Internal Medicine

## 2022-07-13 ENCOUNTER — Encounter (HOSPITAL_COMMUNITY)
Admission: RE | Disposition: A | Discharge status: Home / Self Care | Payer: Self-pay | Source: Home / Self Care | Attending: Internal Medicine

## 2022-07-13 ENCOUNTER — Ambulatory Visit (HOSPITAL_COMMUNITY): Payer: Medicare Other | Admitting: Certified Registered Nurse Anesthetist

## 2022-07-13 ENCOUNTER — Ambulatory Visit (HOSPITAL_COMMUNITY)
Admission: RE | Admit: 2022-07-13 | Discharge: 2022-07-13 | Disposition: A | Discharge status: Home / Self Care | Payer: Medicare Other | Attending: Internal Medicine | Admitting: Internal Medicine

## 2022-07-13 ENCOUNTER — Encounter (HOSPITAL_COMMUNITY): Payer: Self-pay

## 2022-07-13 ENCOUNTER — Ambulatory Visit (HOSPITAL_BASED_OUTPATIENT_CLINIC_OR_DEPARTMENT_OTHER): Payer: Medicare Other | Admitting: Certified Registered Nurse Anesthetist

## 2022-07-13 DIAGNOSIS — Z86010 Personal history of colonic polyps: Secondary | ICD-10-CM | POA: Insufficient documentation

## 2022-07-13 DIAGNOSIS — Z09 Encounter for follow-up examination after completed treatment for conditions other than malignant neoplasm: Secondary | ICD-10-CM

## 2022-07-13 DIAGNOSIS — Z79899 Other long term (current) drug therapy: Secondary | ICD-10-CM | POA: Insufficient documentation

## 2022-07-13 DIAGNOSIS — K219 Gastro-esophageal reflux disease without esophagitis: Secondary | ICD-10-CM | POA: Diagnosis not present

## 2022-07-13 DIAGNOSIS — I10 Essential (primary) hypertension: Secondary | ICD-10-CM

## 2022-07-13 DIAGNOSIS — Z8249 Family history of ischemic heart disease and other diseases of the circulatory system: Secondary | ICD-10-CM | POA: Insufficient documentation

## 2022-07-13 DIAGNOSIS — Z833 Family history of diabetes mellitus: Secondary | ICD-10-CM | POA: Diagnosis not present

## 2022-07-13 DIAGNOSIS — D124 Benign neoplasm of descending colon: Secondary | ICD-10-CM

## 2022-07-13 DIAGNOSIS — J4540 Moderate persistent asthma, uncomplicated: Secondary | ICD-10-CM | POA: Diagnosis not present

## 2022-07-13 DIAGNOSIS — Z800 Family history of malignant neoplasm of digestive organs: Secondary | ICD-10-CM | POA: Insufficient documentation

## 2022-07-13 DIAGNOSIS — D123 Benign neoplasm of transverse colon: Secondary | ICD-10-CM

## 2022-07-13 DIAGNOSIS — E119 Type 2 diabetes mellitus without complications: Secondary | ICD-10-CM | POA: Diagnosis not present

## 2022-07-13 DIAGNOSIS — G4730 Sleep apnea, unspecified: Secondary | ICD-10-CM | POA: Insufficient documentation

## 2022-07-13 DIAGNOSIS — Z1211 Encounter for screening for malignant neoplasm of colon: Secondary | ICD-10-CM | POA: Diagnosis not present

## 2022-07-13 DIAGNOSIS — Z6841 Body Mass Index (BMI) 40.0 and over, adult: Secondary | ICD-10-CM | POA: Diagnosis not present

## 2022-07-13 DIAGNOSIS — K635 Polyp of colon: Secondary | ICD-10-CM | POA: Diagnosis not present

## 2022-07-13 HISTORY — PX: POLYPECTOMY: SHX5525

## 2022-07-13 HISTORY — PX: COLONOSCOPY WITH PROPOFOL: SHX5780

## 2022-07-13 LAB — GLUCOSE, CAPILLARY: Glucose-Capillary: 136 mg/dL — ABNORMAL HIGH (ref 70–99)

## 2022-07-13 SURGERY — COLONOSCOPY WITH PROPOFOL
Anesthesia: General

## 2022-07-13 MED ORDER — LACTATED RINGERS IV SOLN: INTRAVENOUS | Status: DC

## 2022-07-13 MED ORDER — STERILE WATER FOR IRRIGATION IR SOLN
Status: DC | PRN
  Administered 2022-07-13: 60 mL

## 2022-07-13 MED ORDER — PROPOFOL 10 MG/ML IV BOLUS
INTRAVENOUS | Status: DC | PRN
  Administered 2022-07-13: 50 mg via INTRAVENOUS
  Administered 2022-07-13: 30 mg via INTRAVENOUS
  Administered 2022-07-13: 100 mg via INTRAVENOUS

## 2022-07-13 MED ORDER — LIDOCAINE HCL (CARDIAC) PF 100 MG/5ML IV SOSY
PREFILLED_SYRINGE | INTRAVENOUS | Status: DC | PRN
  Administered 2022-07-13: 50 mg via INTRAVENOUS

## 2022-07-13 NOTE — Op Note (Signed)
[facility] Patient Name: Sandra [name] Procedure Date: 07/13/2022 7:15 AM MRN: [id number] Date of Birth: [birth year] Attending MD: Elon Alas. Abbey Chatters , [place], [id number] CSN: [id number] Age: 72 Admit Type: Outpatient Procedure:                Colonoscopy Indications:              Surveillance: Personal history of adenomatous                            polyps on last colonoscopy > 5 years ago Providers:                Elon Alas. Abbey Chatters, DO, Caprice Kluver, Everardo Pacific Referring MD:              Medicines:                See the Anesthesia note for documentation of the                            administered medications Complications:            No immediate complications. Estimated Blood Loss:     Estimated blood loss was minimal. Procedure:                Pre-Anesthesia Assessment:                           - The anesthesia plan was to use monitored                            anesthesia care (MAC).                           After obtaining informed consent, the colonoscope                            was passed under direct vision. Throughout the                            procedure, the patient's blood pressure, pulse, and                            oxygen saturations were monitored continuously. The                            [id number]-[id number] ([id number]) scope was introduced through                            the anus and advanced to the the cecum, identified                            by appendiceal orifice and ileocecal valve. The                            colonoscopy was performed without difficulty. The  patient tolerated the procedure well. The quality                            of the bowel preparation was evaluated using the                            BBPS [facility] Bowel Preparation Scale) with scores                            of: Right Colon = 3, Transverse Colon = 3 and Left                            Colon = 3 (entire mucosa seen well with no  residual                            staining, small fragments of stool or opaque                            liquid). The total BBPS score equals 9. Scope In: 7:44:08 AM Scope Out: 7:55:49 AM Scope Withdrawal Time: 0 hours 8 minutes 42 seconds  Total Procedure Duration: 0 hours 11 minutes 41 seconds  Findings:      The perianal and digital rectal examinations were normal.      Two sessile polyps were found in the descending colon and transverse       colon. The polyps were 4 to 5 mm in size. These polyps were removed with       a cold snare. Resection and retrieval were complete.      The exam was otherwise without abnormality. Impression:               - Two 4 to 5 mm polyps in the descending colon and                            in the transverse colon, removed with a cold snare.                            Resected and retrieved.                           - The examination was otherwise normal. Moderate Sedation:      Per Anesthesia Care Recommendation:           - Patient has a contact number available for                            emergencies. The signs and symptoms of potential                            delayed complications were discussed with the                            patient. Return to normal activities tomorrow.  Written discharge instructions were provided to the                            patient.                           - Resume previous diet.                           - Continue present medications.                           - Await pathology results.                           - No repeat colonoscopy due to age.                           - Return to GI clinic in 1 year. Procedure Code(s):        --- Professional ---                           [phone], Colonoscopy, flexible; with removal of                            tumor(s), polyp(s), or other lesion(s) by snare                            technique Diagnosis Code(s):        ---  Professional ---                           Z86.010, Personal history of colonic polyps                           D12.4, Benign neoplasm of descending colon                           D12.3, Benign neoplasm of transverse colon (hepatic                            flexure or splenic flexure) CPT copyright 2022 American Medical Association. All rights reserved. The codes documented in this report are preliminary and upon coder review may  be revised to meet current compliance requirements. Elon Alas. Abbey Chatters, DO Holcomb Abbey Chatters, DO 07/13/2022 7:58:17 AM This report has been signed electronically. Number of Addenda: 0

## 2022-07-13 NOTE — Discharge Instructions (Addendum)
  Colonoscopy Discharge Instructions  Read the instructions outlined below and refer to this sheet in the next few weeks. These discharge instructions provide you with general information on caring for yourself after you leave the hospital. Your doctor may also give you specific instructions. While your treatment has been planned according to the most current medical practices available, unavoidable complications occasionally occur.   ACTIVITY You may resume your regular activity, but move at a slower pace for the next 24 hours.  Take frequent rest periods for the next 24 hours.  Walking will help get rid of the air and reduce the bloated feeling in your belly (abdomen).  No driving for 24 hours (because of the medicine (anesthesia) used during the test).   Do not sign any important legal documents or operate any machinery for 24 hours (because of the anesthesia used during the test).  NUTRITION Drink plenty of fluids.  You may resume your normal diet as instructed by your doctor.  Begin with a light meal and progress to your normal diet. Heavy or fried foods are harder to digest and may make you feel sick to your stomach (nauseated).  Avoid alcoholic beverages for 24 hours or as instructed.  MEDICATIONS You may resume your normal medications unless your doctor tells you otherwise.  WHAT YOU CAN EXPECT TODAY Some feelings of bloating in the abdomen.  Passage of more gas than usual.  Spotting of blood in your stool or on the toilet paper.  IF YOU HAD POLYPS REMOVED DURING THE COLONOSCOPY: No aspirin products for 7 days or as instructed.  No alcohol for 7 days or as instructed.  Eat a soft diet for the next 24 hours.  FINDING OUT THE RESULTS OF YOUR TEST Not all test results are available during your visit. If your test results are not back during the visit, make an appointment with your caregiver to find out the results. Do not assume everything is normal if you have not heard from your  caregiver or the medical facility. It is important for you to follow up on all of your test results.  SEEK IMMEDIATE MEDICAL ATTENTION IF: You have more than a spotting of blood in your stool.  Your belly is swollen (abdominal distention).  You are nauseated or vomiting.  You have a temperature over 101.  You have abdominal pain or discomfort that is severe or gets worse throughout the day.   Your colonoscopy revealed 2 polyps which I removed successfully.  Await pathology results, my office will contact you.  Given your age, I think it is reasonable to not pursue further colonoscopy for polyp surveillance.  Follow-up with GI in 1 year.  I hope you have a great rest of your week!  Elon Alas. Abbey Chatters, D.O. Gastroenterology and Hepatology [facility]

## 2022-07-13 NOTE — Transfer of Care (Signed)
Immediate Anesthesia Transfer of Care Note  Patient: Sandra [name]  Procedure(s) Performed: COLONOSCOPY WITH PROPOFOL POLYPECTOMY  Patient Location: Short Stay  Anesthesia Type:General  Level of Consciousness: drowsy  Airway & Oxygen Therapy: Patient Spontanous Breathing  Post-op Assessment: Report given to RN and Post -op Vital signs reviewed and stable  Post vital signs: Reviewed and stable  Last Vitals:  Vitals Value Taken Time  BP 131/62 07/13/22 0800  Temp 37 C 07/13/22 0800  Pulse 81 07/13/22 0801  Resp 18 07/13/22 0801  SpO2 92 % 07/13/22 0801  Vitals shown include unvalidated device data.  Last Pain:  Vitals:   07/13/22 0739  TempSrc:   PainSc: 0-No pain      Patients Stated Pain Goal: 5 (18/98/42 1031)  Complications: No notable events documented.

## 2022-07-13 NOTE — Anesthesia Preprocedure Evaluation (Signed)
Anesthesia Evaluation  Patient identified by MRN, date of birth, ID band Patient awake    Reviewed: Allergy & Precautions, H&P , NPO status , Patient's Chart, lab work & pertinent test results, reviewed documented beta blocker date and time   Airway Mallampati: II  TM Distance: >3 FB Neck ROM: full    Dental no notable dental hx.    Pulmonary asthma , sleep apnea    Pulmonary exam normal breath sounds clear to auscultation       Cardiovascular Exercise Tolerance: Good hypertension, negative cardio ROS  Rhythm:regular Rate:Normal     Neuro/Psych  Headaches  negative psych ROS   GI/Hepatic Neg liver ROS,GERD  Medicated,,  Endo/Other  diabetes  Morbid obesity  Renal/GU negative Renal ROS  negative genitourinary   Musculoskeletal   Abdominal   Peds  Hematology  (+) Blood dyscrasia, anemia   Anesthesia Other Findings   Reproductive/Obstetrics negative OB ROS                             Anesthesia Physical Anesthesia Plan  ASA: 3  Anesthesia Plan: General   Post-op Pain Management:    Induction:   PONV Risk Score and Plan: Propofol infusion  Airway Management Planned:   Additional Equipment:   Intra-op Plan:   Post-operative Plan:   Informed Consent: I have reviewed the patients History and Physical, chart, labs and discussed the procedure including the risks, benefits and alternatives for the proposed anesthesia with the patient or authorized representative who has indicated his/her understanding and acceptance.     Dental Advisory Given  Plan Discussed with: CRNA  Anesthesia Plan Comments:        Anesthesia Quick Evaluation

## 2022-07-13 NOTE — Anesthesia Postprocedure Evaluation (Signed)
Anesthesia Post Note  Patient: Sandra [name]  Procedure(s) Performed: COLONOSCOPY WITH PROPOFOL POLYPECTOMY  Patient location during evaluation: Phase II Anesthesia Type: General Level of consciousness: awake Pain management: pain level controlled Vital Signs Assessment: post-procedure vital signs reviewed and stable Respiratory status: spontaneous breathing and respiratory function stable Cardiovascular status: blood pressure returned to baseline and stable Postop Assessment: no headache and no apparent nausea or vomiting Anesthetic complications: no Comments: Late entry   No notable events documented.   Last Vitals:  Vitals:   07/13/22 0658 07/13/22 0800  BP: (!) 153/65 131/62  Pulse: 91 81  Resp: 20 (!) 21  Temp: 37 C 37 C  SpO2: 95% 90%    Last Pain:  Vitals:   07/13/22 0805  TempSrc:   PainSc: 0-No pain                 Louann Sjogren

## 2022-07-13 NOTE — H&P (Signed)
Primary Care Physician:  Celene Squibb, MD Primary Gastroenterologist:  Dr. Abbey Chatters  Pre-Procedure History & Physical: HPI:  Sandra [name] is a 72 y.o. female is here for a colonoscopy to be performed for surveillance purposes, personal history of adenomatous colon polyps in 2017  Past Medical History:  Diagnosis Date   Acid reflux 2000   HA with nexium    Anemia    Angio-edema    caused by Lisinopril   Arthritis    Chest pain, unspecified 03/25/2014   MI ruled out. CTA negative PE   Diabetes mellitus 2012   borderline diabetes, never on medicine    Enteritis 04/23/2011   Hypercholesteremia 2011   Hypertension 1990s    Ileitis 03/24/2014   Moderate persistent asthma in adult without complication 7/37/1062   Shortness of breath dyspnea    Sleep apnea     Past Surgical History:  Procedure Laterality Date   ABDOMINAL HYSTERECTOMY  1983    CATARACT EXTRACTION W/PHACO Right 01/01/2015   Procedure: CATARACT EXTRACTION PHACO AND INTRAOCULAR LENS PLACEMENT ([place]);  Surgeon: Rutherford Guys, MD;  Location: [facility];  Service: Ophthalmology;  Laterality: Right;  CDE:5.11   CATARACT EXTRACTION W/PHACO Left 01/15/2015   Procedure: CATARACT EXTRACTION PHACO AND INTRAOCULAR LENS PLACEMENT (I[facility]);  Surgeon: Rutherford Guys, MD;  Location: [facility];  Service: Ophthalmology;  Laterality: Left;  CDE:4.11   COLONOSCOPY N/A 07/22/2015   Dr. Oneida Alar: simple adenomas, left colon redundant, internal hemorrhoids, surveillance 2020   ESOPHAGOGASTRODUODENOSCOPY N/A 07/22/2015   mild non-erosive gastritis, dysphagia due to web and/or uncontrolled GERD, empiric dilation   HEMORRHOID SURGERY N/A 03/16/2016   Procedure: EXTENSIVE HEMORRHOIDECTOMY;  Surgeon: Aviva Signs, MD;  Location: [facility];  Service: General;  Laterality: N/A;   [place] N/A 07/22/2015   Procedure: SAVORY DILATION;  Surgeon: Danie Binder, MD;  Location: [facility];  Service: Endoscopy;  Laterality: N/A;    TONSILLECTOMY     TUBAL LIGATION  1983     Prior to Admission medications   Medication Sig Start Date End Date Taking? Authorizing Provider  albuterol (PROVENTIL HFA;VENTOLIN HFA) 108 (90 BASE) MCG/ACT inhaler Inhale 1-2 puffs into the lungs every 6 (six) hours as needed for wheezing or shortness of breath. 05/03/15  Yes Tanna Furry, MD  aspirin EC 81 MG tablet Take 81 mg by mouth daily.   Yes [provider]  atorvastatin (LIPITOR) 10 MG tablet Take 1 tablet (10 mg total) by mouth daily. 12/04/14  Yes Funches, Josalyn, MD  Cholecalciferol (VITAMIN D-3) 125 MCG (5000 UT) TABS Take 5,000 Int'l Units by mouth daily.   Yes [provider]  diltiazem (CARDIZEM CD) 180 MG 24 hr capsule Take 180 mg by mouth every morning. 06/04/22  Yes [provider]  hydrochlorothiazide (HYDRODIURIL) 25 MG tablet Take 1 tablet (25 mg total) by mouth daily. 11/22/14  Yes Funches, Josalyn, MD  metoprolol succinate (TOPROL-XL) 50 MG 24 hr tablet Take 50 mg by mouth daily. 11/04/17  Yes [provider]  olopatadine (PATADAY) 0.1 % ophthalmic solution Place 1 drop into both eyes daily as needed (Itching).   Yes [provider]  omeprazole (PRILOSEC) 20 MG capsule TAKE 1 CAPSULE BY MOUTH TWICE DAILY 30 MINUTES PRIOR TO A MEAL. WILL NEED APPOINTMENT FOR FURTHER REFILLS. 04/15/22  Yes Mahala Menghini, PA-C  oxybutynin (DITROPAN-XL) 5 MG 24 hr tablet TAKE (1) TABLET BY MOUTH AT BEDTIME. 08/09/20  Yes Florian Buff, MD  Polyethyl Glycol-Propyl Glycol (SYSTANE ULTRA) 0.4-0.3 % SOLN Place 1 drop into both eyes daily as needed (Irritation and itching).   Yes [provider]  ACCU-CHEK AVIVA PLUS test strip USE AS DIRECTED ONCE DAILY 08/30/20   Florian Buff, MD  predniSONE (DELTASONE) 20 MG tablet 2 tabs po daily x 3 days 07/06/22   Milton Ferguson, MD    Allergies as of 06/04/2022 - Review Complete 06/03/2022  Allergen Reaction Noted   Other Swelling 10/08/2015   Ciprofloxacin  Itching 11/28/2015   Latex Itching 01/01/2015   Lisinopril Swelling 03/24/2014   Penicillins Itching 04/23/2011   Protonix [pantoprazole sodium]  08/08/2015   Sulfa antibiotics  03/24/2014    Family History  Problem Relation Age of Onset   Diabetes Mother    Heart disease Mother    Stroke Mother    Cancer Daughter 75       breast    Allergic rhinitis Daughter    Allergic rhinitis Son    Seizures Son    Diabetes Maternal Aunt    Cancer Maternal Aunt 60       COLON CANCER   Diabetes Maternal Uncle    Asthma Brother    Eczema Brother    Asthma Grandchild    Diabetes Sister    Hypertension Sister    Kidney disease Sister    Emphysema Brother        smoked   Angioedema Neg Hx    Urticaria Neg Hx    Immunodeficiency Neg Hx     Social History   Socioeconomic History   Marital status: Divorced    Spouse name: Not on file   Number of children: 3    Years of education: some colle   Highest education level: Not on file  Occupational History   Occupation: Retired     Comment: Former CNA   Tobacco Use   Smoking status: Never   Smokeless tobacco: Never  Scientific laboratory technician Use: Never used  Substance and Sexual Activity   Alcohol use: No   Drug use: No   Sexual activity: Not Currently    Birth control/protection: Surgical    Comment: hyst  Other Topics Concern   Not on file  Social History Narrative   3 children-2 living, sons. Lives alone. 7 grandchildren. 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    Social Determinants of Health   Financial Resource Strain: Not on file  Food Insecurity: Not on file  Transportation Needs: Not on file  Physical Activity: Not on file  Stress: Not on file  Social Connections: Not on file  Intimate Partner Violence: Not on file    Review of Systems: See HPI, otherwise negative ROS  Physical Exam: Vital signs in last 24 hours: Temp:  [98.6 F (37 C)] 98.6 F (37 C) (01/08 0658) Pulse Rate:  [91] 91 (01/08  0658) Resp:  [20] 20 (01/08 0658) BP: (153)/(65) 153/65 (01/08 0658) SpO2:  [95 %] 95 % (01/08 0658) Weight:  [106.1 kg] 106.1 kg (01/08 0658)   General:   Alert,  Well-developed, well-nourished, pleasant and cooperative in NAD Head:  Normocephalic and atraumatic. Eyes:  Sclera clear, no icterus.   Conjunctiva pink. Ears:  Normal auditory acuity. Nose:  No deformity, discharge,  or lesions. Msk:  Symmetrical without gross deformities. Normal posture. Extremities:  Without clubbing or edema. Neurologic:  Alert and  oriented x4;  grossly normal neurologically. Skin:  Intact without significant lesions or  rashes. Psych:  Alert and cooperative. Normal mood and affect.  Impression/Plan: Sandra [name] is here for a colonoscopy to be performed for surveillance purposes, personal history of adenomatous colon polyps in 2017  The risks of the procedure including infection, bleed, or perforation as well as benefits, limitations, alternatives and imponderables have been reviewed with the patient. Questions have been answered. All parties agreeable.

## 2022-07-14 LAB — SURGICAL PATHOLOGY

## 2022-07-17 ENCOUNTER — Encounter (HOSPITAL_COMMUNITY): Payer: Self-pay | Admitting: Internal Medicine

## 2022-07-17 NOTE — Addendum Note (Signed)
Addendum  created 07/17/22 1216 by Karna Dupes, CRNA   Intraprocedure Staff edited

## 2022-07-22 ENCOUNTER — Encounter (HOSPITAL_COMMUNITY): Payer: Self-pay | Admitting: Family Medicine

## 2022-07-22 ENCOUNTER — Other Ambulatory Visit (HOSPITAL_COMMUNITY): Payer: Self-pay | Admitting: Family Medicine

## 2022-07-22 ENCOUNTER — Ambulatory Visit (HOSPITAL_COMMUNITY)
Admission: RE | Admit: 2022-07-22 | Discharge: 2022-07-22 | Disposition: A | Discharge status: Home / Self Care | Payer: 59 | Source: Ambulatory Visit | Attending: Family Medicine | Admitting: Family Medicine

## 2022-07-22 DIAGNOSIS — J4541 Moderate persistent asthma with (acute) exacerbation: Secondary | ICD-10-CM | POA: Diagnosis not present

## 2022-07-22 DIAGNOSIS — M7989 Other specified soft tissue disorders: Secondary | ICD-10-CM | POA: Insufficient documentation

## 2022-07-22 DIAGNOSIS — R2241 Localized swelling, mass and lump, right lower limb: Secondary | ICD-10-CM | POA: Diagnosis not present

## 2022-07-22 DIAGNOSIS — R2240 Localized swelling, mass and lump, unspecified lower limb: Secondary | ICD-10-CM | POA: Diagnosis not present

## 2022-07-23 ENCOUNTER — Ambulatory Visit (HOSPITAL_COMMUNITY)
Admission: RE | Admit: 2022-07-23 | Discharge: 2022-07-23 | Disposition: A | Discharge status: Home / Self Care | Payer: 59 | Source: Ambulatory Visit | Attending: Family Medicine | Admitting: Family Medicine

## 2022-07-23 ENCOUNTER — Other Ambulatory Visit (HOSPITAL_COMMUNITY): Payer: Self-pay | Admitting: Family Medicine

## 2022-07-23 DIAGNOSIS — M7989 Other specified soft tissue disorders: Secondary | ICD-10-CM | POA: Insufficient documentation

## 2022-07-23 DIAGNOSIS — M79671 Pain in right foot: Secondary | ICD-10-CM | POA: Diagnosis not present

## 2022-07-24 ENCOUNTER — Emergency Department (HOSPITAL_COMMUNITY): Payer: 59

## 2022-07-24 ENCOUNTER — Other Ambulatory Visit: Payer: Self-pay

## 2022-07-24 ENCOUNTER — Emergency Department (HOSPITAL_COMMUNITY)
Admission: EM | Admit: 2022-07-24 | Discharge: 2022-07-24 | Disposition: A | Discharge status: Home / Self Care | Payer: 59 | Attending: Emergency Medicine | Admitting: Emergency Medicine

## 2022-07-24 ENCOUNTER — Encounter (HOSPITAL_COMMUNITY): Payer: Self-pay

## 2022-07-24 DIAGNOSIS — M79671 Pain in right foot: Secondary | ICD-10-CM | POA: Insufficient documentation

## 2022-07-24 DIAGNOSIS — E119 Type 2 diabetes mellitus without complications: Secondary | ICD-10-CM | POA: Diagnosis not present

## 2022-07-24 DIAGNOSIS — I10 Essential (primary) hypertension: Secondary | ICD-10-CM | POA: Insufficient documentation

## 2022-07-24 DIAGNOSIS — Z91040 Latex allergy status: Secondary | ICD-10-CM | POA: Insufficient documentation

## 2022-07-24 DIAGNOSIS — M7989 Other specified soft tissue disorders: Secondary | ICD-10-CM | POA: Diagnosis not present

## 2022-07-24 DIAGNOSIS — R600 Localized edema: Secondary | ICD-10-CM | POA: Diagnosis not present

## 2022-07-24 DIAGNOSIS — Z7982 Long term (current) use of aspirin: Secondary | ICD-10-CM | POA: Insufficient documentation

## 2022-07-24 DIAGNOSIS — M7731 Calcaneal spur, right foot: Secondary | ICD-10-CM | POA: Diagnosis not present

## 2022-07-24 DIAGNOSIS — Z79899 Other long term (current) drug therapy: Secondary | ICD-10-CM | POA: Insufficient documentation

## 2022-07-24 DIAGNOSIS — M19071 Primary osteoarthritis, right ankle and foot: Secondary | ICD-10-CM | POA: Diagnosis not present

## 2022-07-24 DIAGNOSIS — M25571 Pain in right ankle and joints of right foot: Secondary | ICD-10-CM | POA: Diagnosis present

## 2022-07-24 LAB — CBC
HCT: 40.8 % (ref 36.0–46.0)
Hemoglobin: 12.7 g/dL (ref 12.0–15.0)
MCH: 26 pg (ref 26.0–34.0)
MCHC: 31.1 g/dL (ref 30.0–36.0)
MCV: 83.4 fL (ref 80.0–100.0)
Platelets: 269 10*3/uL (ref 150–400)
RBC: 4.89 MIL/uL (ref 3.87–5.11)
RDW: 14.4 % (ref 11.5–15.5)
WBC: 5.2 10*3/uL (ref 4.0–10.5)
nRBC: 0 % (ref 0.0–0.2)

## 2022-07-24 LAB — BASIC METABOLIC PANEL
Anion gap: 11 (ref 5–15)
BUN: 16 mg/dL (ref 8–23)
CO2: 28 mmol/L (ref 22–32)
Calcium: 9.1 mg/dL (ref 8.9–10.3)
Chloride: 100 mmol/L (ref 98–111)
Creatinine, Ser: 0.94 mg/dL (ref 0.44–1.00)
GFR, Estimated: >60 mL/min (ref >60)
Glucose, Bld: 96 mg/dL (ref 70–99)
Potassium: 3.5 mmol/L (ref 3.5–5.1)
Sodium: 139 mmol/L (ref 135–145)

## 2022-07-24 NOTE — ED Provider Notes (Signed)
[facility] Provider Note   CSN: [id number] Arrival date & time: 07/24/22  7711     History  Chief Complaint  Patient presents with   Foot Swelling    Sandra [name] is a 72 y.o. female.  Patient with history of diabetes, hypertension presents today with complaints of right ankle pain.  She states that same has been ongoing for the past 1 week.  She denies any injury.  She states that she went to her primary care doctor who did an x-ray and a DVT ultrasound.  She states that the ultrasound was normal but the x-ray was concerning for osteomyelitis and her primary care doctor recommended that she come here for antibiotics.  She presents for same.  She denies any fevers or chills.  The history is provided by the patient. No language interpreter was used.       Home Medications Prior to Admission medications   Medication Sig Start Date End Date Taking? Authorizing Provider  ACCU-CHEK AVIVA PLUS test strip USE AS DIRECTED ONCE DAILY 08/30/20   Florian Buff, MD  albuterol (PROVENTIL HFA;VENTOLIN HFA) 108 (90 BASE) MCG/ACT inhaler Inhale 1-2 puffs into the lungs every 6 (six) hours as needed for wheezing or shortness of breath. 05/03/15   Tanna Furry, MD  aspirin EC 81 MG tablet Take 81 mg by mouth daily.    [provider]  atorvastatin (LIPITOR) 10 MG tablet Take 1 tablet (10 mg total) by mouth daily. 12/04/14   Funches, Adriana Mccallum, MD  Cholecalciferol (VITAMIN D-3) 125 MCG (5000 UT) TABS Take 5,000 Int'l Units by mouth daily.    [provider]  diltiazem (CARDIZEM CD) 180 MG 24 hr capsule Take 180 mg by mouth every morning. 06/04/22   [provider]  hydrochlorothiazide (HYDRODIURIL) 25 MG tablet Take 1 tablet (25 mg total) by mouth daily. 11/22/14   Funches, Adriana Mccallum, MD  metoprolol succinate (TOPROL-XL) 50 MG 24 hr tablet Take 50 mg by mouth daily. 11/04/17   [provider]  olopatadine (PATADAY) 0.1 %  ophthalmic solution Place 1 drop into both eyes daily as needed (Itching).    [provider]  omeprazole (PRILOSEC) 20 MG capsule TAKE 1 CAPSULE BY MOUTH TWICE DAILY 30 MINUTES PRIOR TO A MEAL. WILL NEED APPOINTMENT FOR FURTHER REFILLS. 04/15/22   Mahala Menghini, PA-C  oxybutynin (DITROPAN-XL) 5 MG 24 hr tablet TAKE (1) TABLET BY MOUTH AT BEDTIME. 08/09/20   Florian Buff, MD  Polyethyl Glycol-Propyl Glycol (SYSTANE ULTRA) 0.4-0.3 % SOLN Place 1 drop into both eyes daily as needed (Irritation and itching).    [provider]  predniSONE (DELTASONE) 20 MG tablet 2 tabs po daily x 3 days 07/06/22   Milton Ferguson, MD      Allergies    Other, Ciprofloxacin, Latex, Lisinopril, Penicillins, Protonix [pantoprazole sodium], and Sulfa antibiotics    Review of Systems   Review of Systems  Musculoskeletal:  Positive for arthralgias.  All other systems reviewed and are negative.   Physical Exam Updated Vital Signs BP (!) 144/76 (BP Location: Right Arm)   Pulse 72   Temp 97.8 F (36.6 C) (Oral)   Resp 16   Ht '5\' 3"'$  (1.6 m)   Wt 105.7 kg   SpO2 96%   BMI 41.27 kg/m  Physical Exam Vitals and nursing note reviewed.  Constitutional:      General: She is not in acute distress.    Appearance: Normal appearance.  She is normal weight. She is not ill-appearing, toxic-appearing or diaphoretic.  HENT:     Head: Normocephalic and atraumatic.  Cardiovascular:     Rate and Rhythm: Normal rate.  Pulmonary:     Effort: Pulmonary effort is normal. No respiratory distress.  Musculoskeletal:        General: Normal range of motion.     Cervical back: Normal range of motion.     Comments: Swelling noted to the dorsal aspect of the right foot without erythema or warmth. Tenderness noted to palpation of the medial malleolus without deformity. DP and PT pulses intact and 2+. No calf tenderness. Capillary refill less than 2 seconds  Skin:    General: Skin is warm and dry.  Neurological:      General: No focal deficit present.     Mental Status: She is alert.  Psychiatric:        Mood and Affect: Mood normal.        Behavior: Behavior normal.     ED Results / Procedures / Treatments   Labs (all labs ordered are listed, but only abnormal results are displayed) Labs Reviewed - No data to display  EKG None  Radiology DG Ankle Complete Right  Result Date: 07/24/2022 CLINICAL DATA:  Foot swelling. EXAM: RIGHT ANKLE - COMPLETE 3+ VIEW COMPARISON:  Right foot radiographs 07/23/2022, right tibia and fibula radiographs 02/24/2021 FINDINGS: The ankle mortise is symmetric and intact. Small 4 mm lucency within the far lateral aspect of the talar dome is likely unchanged from 02/24/2021 radiographs. Mild distal medial malleolar degenerative spurring/enthesopathic changes unchanged. Moderate lateral and mild medial malleolar soft tissue swelling. Mild chronic enthesopathic change at the Achilles insertion on the calcaneus. Tiny plantar calcaneal heel spur. No acute fracture or dislocation.  Mild vascular calcifications. IMPRESSION: 1. Moderate lateral and mild medial malleolar soft tissue swelling. 2. Small 4 mm lucency within the far lateral aspect of the talar dome is likely unchanged from 02/24/2021 radiographs and may represent a degenerative subchondral cyst. Electronically Signed   By: Yvonne Kendall M.D.   On: 07/24/2022 12:46   DG Foot Complete Right  Result Date: 07/23/2022 CLINICAL DATA:  Right foot pain and swelling for 1 week. EXAM: RIGHT FOOT COMPLETE - 3+ VIEW COMPARISON:  09/12/2018 FINDINGS: Mild degenerative changes in the interphalangeal and intertarsal joints. No evidence of acute fracture or dislocation. There is a focal lucency in the base of the right first metatarsal bone, new since prior study. This is probably a degenerative cyst but could indicate infection or osteomyelitis in the appropriate clinical setting. Diffuse soft tissue swelling over the dorsum of the foot. No  soft tissue gas or radiopaque soft tissue foreign bodies. IMPRESSION: 1. Diffuse soft tissue swelling. 2. Focal lucent lesion in the base of the first metatarsal bone, likely degenerative cysts but could indicate infection or osteomyelitis. 3. Mild degenerative changes. Electronically Signed   By: Lucienne Capers M.D.   On: 07/23/2022 15:40   US Venous Img Lower Unilateral Right (DVT)  Result Date: 07/22/2022 CLINICAL DATA:  72 year old female with swelling EXAM: RIGHT LOWER EXTREMITY VENOUS DOPPLER ULTRASOUND TECHNIQUE: Gray-scale sonography with graded compression, as well as color Doppler and duplex ultrasound were performed to evaluate the lower extremity deep venous systems from the level of the common femoral vein and including the common femoral, femoral, profunda femoral, popliteal and calf veins including the posterior tibial, peroneal and gastrocnemius veins when visible. The superficial great saphenous vein was also interrogated. Spectral Doppler  was utilized to evaluate flow at rest and with distal augmentation maneuvers in the common femoral, femoral and popliteal veins. COMPARISON:  None Available. FINDINGS: Contralateral Common Femoral Vein: Respiratory phasicity is normal and symmetric with the symptomatic side. No evidence of thrombus. Normal compressibility. Common Femoral Vein: No evidence of thrombus. Normal compressibility, respiratory phasicity and response to augmentation. Saphenofemoral Junction: No evidence of thrombus. Normal compressibility and flow on color Doppler imaging. Profunda Femoral Vein: No evidence of thrombus. Normal compressibility and flow on color Doppler imaging. Femoral Vein: No evidence of thrombus. Normal compressibility, respiratory phasicity and response to augmentation. Popliteal Vein: No evidence of thrombus. Normal compressibility, respiratory phasicity and response to augmentation. Calf Veins: No evidence of thrombus. Normal compressibility and flow on color  Doppler imaging. Superficial Great Saphenous Vein: No evidence of thrombus. Normal compressibility and flow on color Doppler imaging. Other Findings:  None. IMPRESSION: Directed duplex of the right lower extremity negative for DVT Signed, Dulcy Fanny. Nadene Rubins, RPVI [facility] Radiology Electronically Signed   By: Corrie Mckusick D.O.   On: 07/22/2022 16:53    Procedures Procedures    Medications Ordered in ED Medications - No data to display  ED Course/ Medical Decision Making/ A&P                             Medical Decision Making Amount and/or Complexity of Data Reviewed Labs: ordered. Radiology: ordered.   This patient is a 72 y.o. female who presents to the ED for concern of right foot and ankle pain/swelling, this involves an extensive number of treatment options, and is a complaint that carries with it a high risk of complications and morbidity. The emergent differential diagnosis prior to evaluation includes, but is not limited to,  septic arthritis, osteomyelitis, DVT, inflammatory arthritis, gout, cellulitis, stress fracture, trauma. This is not an exhaustive differential.   Past Medical History / Co-morbidities / Social History: Hx hypertension, diabetes  Additional history: Chart reviewed. Pertinent results include: patient had negative DVT study yesterday and x-ray imaging that revealed:  1. Diffuse soft tissue swelling. 2. Focal lucent lesion in the base of the first metatarsal bone, likely degenerative cysts but could indicate infection or osteomyelitis. 3. Mild degenerative changes.  Physical Exam: Physical exam performed. The pertinent findings include: Mild swelling noted to the dorsal aspect of the foot with tenderness over the medial malleolus. No crepitus or deformity. No overlying wound or skin changes. No erythema or warmth.  Lab Tests: I ordered, and personally interpreted labs.  The pertinent results  include: No leukocytosis.  No acute laboratory findings.   Imaging Studies: I ordered imaging studies including DG right ankle, MRI right foot and ankle. I independently visualized and interpreted imaging which showed   DG:   1. Moderate lateral and mild medial malleolar soft tissue swelling. 2. Small 4 mm lucency within the far lateral aspect of the talar dome is likely unchanged from 02/24/2021 radiographs and may represent a degenerative subchondral cyst.  MRI:   1. Nonspecific subcutaneous edema surrounding the right ankle and dorsal aspect of the midfoot and forefoot. No focal fluid collections or specific evidence of soft tissue infection or ulceration. Correlate clinically. 2. No evidence of osteomyelitis, septic arthritis or septic tenosynovitis. 3. Mild degenerative changes at the midfoot and 1st metatarsophalangeal joint.  I agree with the radiologist interpretation.   Disposition:  Patient send to ER from PCP office with  concern for right foot osteomyelitis.  She is afebrile, nontoxic-appearing, and in no acute distress with reassuring vital signs.  Physical exam reveals some soft tissue swelling but no erythema, warmth, drainage, or wounds.  Very low suspicion for infectious process.  However, she did have an outpatient x-ray that was concerning for osteomyelitis and therefore we did obtain MRI to rule this out. MRI was negative for osteomyelitis or septic arthritis. Suspect she has inflammatory arthritis. Will recommend RICE and tylenol and topical NSAIDs for pain.  Patient is stable to be discharged.  She is understanding and amenable with plan, educated symptoms that would prompt immediate return.  Patient discharged in stable condition   This is a shared visit with supervising physician Dr. Roderic Palau who has independently evaluated patient & provided guidance in evaluation/management/disposition, in agreement with care   Final Clinical Impression(s) / ED Diagnoses Final  diagnoses:  Right foot pain    Rx / DC Orders ED Discharge Orders     None     An After Visit Summary was printed and given to the patient.     Nestor Lewandowsky 07/24/22 1710    Milton Ferguson, MD 07/28/22 [phone]

## 2022-07-24 NOTE — ED Triage Notes (Signed)
Pt reports right foot and ankle swollen x 1 week.  Reports hx of same and was told it was arthritis but PCP told her to come to ER to rule out infection.

## 2022-07-24 NOTE — ED Notes (Signed)
Pt has swelling to R ankle and foot. No redness or warmth noted. Pt denies using compression stockings, states this makes the pain worse.

## 2022-07-24 NOTE — Discharge Instructions (Signed)
As we discussed, your MRI did not reveal any signs of an infection.  I recommend that you rest, ice, compress, and elevate your foot for relief of pain and swelling.  You may also take Tylenol as well for your pain. I also recommend topical Voltaren cream which you can get over the counter. Follow-up with your primary care doctor for continued evaluation and management of your symptoms.   Return if development of any new or worsening symptoms

## 2022-07-29 ENCOUNTER — Telehealth: Payer: Self-pay

## 2022-07-29 NOTE — Telephone Encounter (Signed)
     Patient  visit on 07/24/2022  at [facility] was for right foot pain.  Have you been able to follow up with your primary care physician? Yes  The patient was or was not able to obtain any needed medicine or equipment. No medication prescribed.  Are there diet recommendations that you are having difficulty following? No  Patient expresses understanding of discharge instructions and education provided has no other needs at this time.    [facility] Resource Care Guide   [email or web address]  ?? [phone]   Website: [email or web address]  [email or web address]

## 2022-08-10 DIAGNOSIS — N644 Mastodynia: Secondary | ICD-10-CM | POA: Diagnosis not present

## 2022-08-12 ENCOUNTER — Other Ambulatory Visit (HOSPITAL_COMMUNITY): Payer: Self-pay | Admitting: Family Medicine

## 2022-08-12 ENCOUNTER — Encounter (HOSPITAL_COMMUNITY): Payer: Self-pay | Admitting: Internal Medicine

## 2022-08-12 DIAGNOSIS — N644 Mastodynia: Secondary | ICD-10-CM

## 2022-08-31 ENCOUNTER — Other Ambulatory Visit: Payer: Self-pay | Admitting: Gastroenterology

## 2022-08-31 DIAGNOSIS — R262 Difficulty in walking, not elsewhere classified: Secondary | ICD-10-CM | POA: Diagnosis not present

## 2022-08-31 DIAGNOSIS — J4550 Severe persistent asthma, uncomplicated: Secondary | ICD-10-CM | POA: Diagnosis not present

## 2022-08-31 DIAGNOSIS — J45909 Unspecified asthma, uncomplicated: Secondary | ICD-10-CM | POA: Diagnosis not present

## 2022-09-01 ENCOUNTER — Encounter (HOSPITAL_COMMUNITY): Payer: Self-pay

## 2022-09-01 ENCOUNTER — Ambulatory Visit (HOSPITAL_COMMUNITY)
Admission: RE | Admit: 2022-09-01 | Discharge: 2022-09-01 | Disposition: A | Discharge status: Home / Self Care | Payer: 59 | Source: Ambulatory Visit | Attending: Family Medicine | Admitting: Family Medicine

## 2022-09-01 DIAGNOSIS — N644 Mastodynia: Secondary | ICD-10-CM | POA: Insufficient documentation

## 2022-09-07 ENCOUNTER — Other Ambulatory Visit: Payer: Self-pay

## 2022-09-14 DIAGNOSIS — R262 Difficulty in walking, not elsewhere classified: Secondary | ICD-10-CM | POA: Diagnosis not present

## 2022-09-23 ENCOUNTER — Other Ambulatory Visit (HOSPITAL_COMMUNITY): Payer: Self-pay | Admitting: Family Medicine

## 2022-09-23 DIAGNOSIS — E559 Vitamin D deficiency, unspecified: Secondary | ICD-10-CM | POA: Diagnosis not present

## 2022-09-23 DIAGNOSIS — I10 Essential (primary) hypertension: Secondary | ICD-10-CM | POA: Diagnosis not present

## 2022-09-23 DIAGNOSIS — Z139 Encounter for screening, unspecified: Secondary | ICD-10-CM

## 2022-09-23 DIAGNOSIS — R7303 Prediabetes: Secondary | ICD-10-CM | POA: Diagnosis not present

## 2022-09-23 DIAGNOSIS — E785 Hyperlipidemia, unspecified: Secondary | ICD-10-CM | POA: Diagnosis not present

## 2022-10-07 DIAGNOSIS — Z0000 Encounter for general adult medical examination without abnormal findings: Secondary | ICD-10-CM | POA: Diagnosis not present

## 2022-10-08 DIAGNOSIS — G4733 Obstructive sleep apnea (adult) (pediatric): Secondary | ICD-10-CM | POA: Diagnosis not present

## 2022-10-08 DIAGNOSIS — J302 Other seasonal allergic rhinitis: Secondary | ICD-10-CM | POA: Diagnosis not present

## 2022-10-08 DIAGNOSIS — Z0000 Encounter for general adult medical examination without abnormal findings: Secondary | ICD-10-CM | POA: Diagnosis not present

## 2022-10-08 DIAGNOSIS — I10 Essential (primary) hypertension: Secondary | ICD-10-CM | POA: Diagnosis not present

## 2022-10-08 DIAGNOSIS — K219 Gastro-esophageal reflux disease without esophagitis: Secondary | ICD-10-CM | POA: Diagnosis not present

## 2022-10-08 DIAGNOSIS — R7303 Prediabetes: Secondary | ICD-10-CM | POA: Diagnosis not present

## 2022-10-08 DIAGNOSIS — I25119 Atherosclerotic heart disease of native coronary artery with unspecified angina pectoris: Secondary | ICD-10-CM | POA: Diagnosis not present

## 2022-10-08 DIAGNOSIS — G3184 Mild cognitive impairment, so stated: Secondary | ICD-10-CM | POA: Diagnosis not present

## 2022-10-08 DIAGNOSIS — N1831 Chronic kidney disease, stage 3a: Secondary | ICD-10-CM | POA: Diagnosis not present

## 2022-10-08 DIAGNOSIS — J45909 Unspecified asthma, uncomplicated: Secondary | ICD-10-CM | POA: Diagnosis not present

## 2022-10-08 DIAGNOSIS — E782 Mixed hyperlipidemia: Secondary | ICD-10-CM | POA: Diagnosis not present

## 2022-10-08 DIAGNOSIS — E559 Vitamin D deficiency, unspecified: Secondary | ICD-10-CM | POA: Diagnosis not present

## 2022-10-09 ENCOUNTER — Ambulatory Visit (HOSPITAL_COMMUNITY)
Admission: RE | Admit: 2022-10-09 | Discharge: 2022-10-09 | Disposition: A | Discharge status: Home / Self Care | Payer: 59 | Source: Ambulatory Visit | Attending: Family Medicine | Admitting: Family Medicine

## 2022-10-09 DIAGNOSIS — Z139 Encounter for screening, unspecified: Secondary | ICD-10-CM | POA: Diagnosis not present

## 2022-10-09 DIAGNOSIS — Z1231 Encounter for screening mammogram for malignant neoplasm of breast: Secondary | ICD-10-CM | POA: Diagnosis not present

## 2022-10-13 ENCOUNTER — Other Ambulatory Visit (HOSPITAL_COMMUNITY): Payer: Self-pay | Admitting: Family Medicine

## 2022-10-13 DIAGNOSIS — Z139 Encounter for screening, unspecified: Secondary | ICD-10-CM

## 2022-10-19 DIAGNOSIS — R262 Difficulty in walking, not elsewhere classified: Secondary | ICD-10-CM | POA: Diagnosis not present

## 2022-11-19 DIAGNOSIS — N1831 Chronic kidney disease, stage 3a: Secondary | ICD-10-CM | POA: Diagnosis not present

## 2022-11-19 DIAGNOSIS — Z713 Dietary counseling and surveillance: Secondary | ICD-10-CM | POA: Diagnosis not present

## 2023-03-04 ENCOUNTER — Other Ambulatory Visit: Payer: Self-pay | Admitting: Internal Medicine

## 2023-03-09 ENCOUNTER — Other Ambulatory Visit: Payer: Self-pay | Admitting: Internal Medicine

## 2023-03-17 ENCOUNTER — Other Ambulatory Visit: Payer: Self-pay

## 2023-03-17 DIAGNOSIS — K219 Gastro-esophageal reflux disease without esophagitis: Secondary | ICD-10-CM

## 2023-03-17 MED ORDER — OMEPRAZOLE 20 MG PO CPDR: DELAYED_RELEASE_CAPSULE | ORAL | 0 refills | Status: DC

## 2023-04-03 ENCOUNTER — Other Ambulatory Visit: Payer: Self-pay | Admitting: Obstetrics & Gynecology

## 2023-04-12 DIAGNOSIS — R7303 Prediabetes: Secondary | ICD-10-CM | POA: Diagnosis not present

## 2023-04-12 DIAGNOSIS — I10 Essential (primary) hypertension: Secondary | ICD-10-CM | POA: Diagnosis not present

## 2023-04-12 DIAGNOSIS — E782 Mixed hyperlipidemia: Secondary | ICD-10-CM | POA: Diagnosis not present

## 2023-04-12 DIAGNOSIS — E559 Vitamin D deficiency, unspecified: Secondary | ICD-10-CM | POA: Diagnosis not present

## 2023-04-20 DIAGNOSIS — N611 Abscess of the breast and nipple: Secondary | ICD-10-CM | POA: Diagnosis not present

## 2023-04-20 DIAGNOSIS — Z23 Encounter for immunization: Secondary | ICD-10-CM | POA: Diagnosis not present

## 2023-05-12 DIAGNOSIS — G3184 Mild cognitive impairment, so stated: Secondary | ICD-10-CM | POA: Diagnosis not present

## 2023-05-12 DIAGNOSIS — I129 Hypertensive chronic kidney disease with stage 1 through stage 4 chronic kidney disease, or unspecified chronic kidney disease: Secondary | ICD-10-CM | POA: Diagnosis not present

## 2023-05-12 DIAGNOSIS — J4550 Severe persistent asthma, uncomplicated: Secondary | ICD-10-CM | POA: Diagnosis not present

## 2023-05-12 DIAGNOSIS — I25119 Atherosclerotic heart disease of native coronary artery with unspecified angina pectoris: Secondary | ICD-10-CM | POA: Diagnosis not present

## 2023-05-12 DIAGNOSIS — E1122 Type 2 diabetes mellitus with diabetic chronic kidney disease: Secondary | ICD-10-CM | POA: Diagnosis not present

## 2023-05-12 DIAGNOSIS — E782 Mixed hyperlipidemia: Secondary | ICD-10-CM | POA: Diagnosis not present

## 2023-05-12 DIAGNOSIS — G4733 Obstructive sleep apnea (adult) (pediatric): Secondary | ICD-10-CM | POA: Diagnosis not present

## 2023-05-12 DIAGNOSIS — I2510 Atherosclerotic heart disease of native coronary artery without angina pectoris: Secondary | ICD-10-CM | POA: Diagnosis not present

## 2023-05-12 DIAGNOSIS — K219 Gastro-esophageal reflux disease without esophagitis: Secondary | ICD-10-CM | POA: Diagnosis not present

## 2023-05-12 DIAGNOSIS — N1831 Chronic kidney disease, stage 3a: Secondary | ICD-10-CM | POA: Diagnosis not present

## 2023-05-12 DIAGNOSIS — J45909 Unspecified asthma, uncomplicated: Secondary | ICD-10-CM | POA: Diagnosis not present

## 2023-06-08 DIAGNOSIS — M79674 Pain in right toe(s): Secondary | ICD-10-CM | POA: Diagnosis not present

## 2023-06-08 DIAGNOSIS — Z713 Dietary counseling and surveillance: Secondary | ICD-10-CM | POA: Diagnosis not present

## 2023-06-08 DIAGNOSIS — R519 Headache, unspecified: Secondary | ICD-10-CM | POA: Diagnosis not present

## 2023-06-08 DIAGNOSIS — I129 Hypertensive chronic kidney disease with stage 1 through stage 4 chronic kidney disease, or unspecified chronic kidney disease: Secondary | ICD-10-CM | POA: Diagnosis not present

## 2023-06-08 DIAGNOSIS — N611 Abscess of the breast and nipple: Secondary | ICD-10-CM | POA: Diagnosis not present

## 2023-06-08 DIAGNOSIS — G4733 Obstructive sleep apnea (adult) (pediatric): Secondary | ICD-10-CM | POA: Diagnosis not present

## 2023-06-08 DIAGNOSIS — I10 Essential (primary) hypertension: Secondary | ICD-10-CM | POA: Diagnosis not present

## 2023-06-08 DIAGNOSIS — N1831 Chronic kidney disease, stage 3a: Secondary | ICD-10-CM | POA: Diagnosis not present

## 2023-06-25 ENCOUNTER — Encounter: Payer: Self-pay | Admitting: Internal Medicine

## 2023-08-05 ENCOUNTER — Other Ambulatory Visit: Payer: Self-pay | Admitting: Internal Medicine

## 2023-08-05 DIAGNOSIS — K219 Gastro-esophageal reflux disease without esophagitis: Secondary | ICD-10-CM

## 2023-08-13 ENCOUNTER — Other Ambulatory Visit: Payer: Self-pay | Admitting: Internal Medicine

## 2023-08-13 DIAGNOSIS — K219 Gastro-esophageal reflux disease without esophagitis: Secondary | ICD-10-CM

## 2023-08-24 ENCOUNTER — Ambulatory Visit (INDEPENDENT_AMBULATORY_CARE_PROVIDER_SITE_OTHER): Payer: 59 | Admitting: Gastroenterology

## 2023-08-24 ENCOUNTER — Encounter: Payer: Self-pay | Admitting: Gastroenterology

## 2023-08-24 VITALS — BP 128/73 | HR 56 | Temp 98.6°F | Ht 63.0 in | Wt 237.0 lb

## 2023-08-24 DIAGNOSIS — K219 Gastro-esophageal reflux disease without esophagitis: Secondary | ICD-10-CM

## 2023-08-24 DIAGNOSIS — R1310 Dysphagia, unspecified: Secondary | ICD-10-CM | POA: Diagnosis not present

## 2023-08-24 NOTE — Patient Instructions (Signed)
 Complete xray of your esophagus. We will be in touch with results as available.

## 2023-08-24 NOTE — Progress Notes (Signed)
 GI Office Note    Referring Provider: Benita Stabile, MD Primary Care Physician:  Benita Stabile, MD  Primary Gastroenterologist: Hennie Duos. Marletta Lor, DO   Chief Complaint   Chief Complaint  Patient presents with   Follow-up    Needs refills on reflux meds    History of Present Illness   Sandra [name] is a 73 y.o. female presenting today for follow up. Last seen 05/2022. H/o GERD.   Today:  Has been out of omeprazole for 3 weeks. Started using bay leaf to manage reflux and stomach issues. Boils 4-5 in a big pot of water, lasts her for several days. Drinks a glass every morning and and after eats, once or twice a day. Doing well with reflux. She continues to note issues swallowing. Feels like she has to wash her food and medication down. Feels stuck in her lower neck area. Esophageal dilation helped a little in 2017. States she was supposed to have another dilation but then Dr. Darrick Penna left. BMs regular. No melena, brbpr.      EGD in January 2017 with mild nonerosive gastritis, dysphagia due to web and/or uncontrolled GERD, empiric dilation.   Colonoscopy in January 2017 with simple adenomas, left colon redundant, internal hemorrhoids, surveillance in 2020.  Colonoscopy 07/2022: -two 4-30mm polyps in descending and transverse colon, tubular adenomas -no surveillance colonoscopy needed due to age    Medications   Current Outpatient Medications  Medication Sig Dispense Refill   ACCU-CHEK AVIVA PLUS test strip USE AS DIRECTED ONCE DAILY 50 strip 1   albuterol (PROVENTIL HFA;VENTOLIN HFA) 108 (90 BASE) MCG/ACT inhaler Inhale 1-2 puffs into the lungs every 6 (six) hours as needed for wheezing or shortness of breath. 1 Inhaler 0   aspirin EC 81 MG tablet Take 81 mg by mouth daily.     atorvastatin (LIPITOR) 10 MG tablet Take 1 tablet (10 mg total) by mouth daily. 90 tablet 0   Cholecalciferol (VITAMIN D-3) 125 MCG (5000 UT) TABS Take 5,000 Int'l Units by mouth daily.      diltiazem (CARDIZEM CD) 180 MG 24 hr capsule Take 180 mg by mouth every morning.     hydrochlorothiazide (HYDRODIURIL) 25 MG tablet Take 1 tablet (25 mg total) by mouth daily. 30 tablet 2   metoprolol succinate (TOPROL-XL) 50 MG 24 hr tablet Take 50 mg by mouth daily.     olopatadine (PATADAY) 0.1 % ophthalmic solution Place 1 drop into both eyes daily as needed (Itching).     omeprazole (PRILOSEC) 20 MG capsule TAKE 1 CAPSULE BY MOUTH TWICE DAILY 30 MINUTES PRIOR TO A MEAL. WILL NEED APPOINTMENT FOR FURTHER REFILLS. 180 capsule 0   oxybutynin (DITROPAN-XL) 5 MG 24 hr tablet TAKE (1) TABLET BY MOUTH AT BEDTIME. 90 tablet 3   Polyethyl Glycol-Propyl Glycol (SYSTANE ULTRA) 0.4-0.3 % SOLN Place 1 drop into both eyes daily as needed (Irritation and itching).     No current facility-administered medications for this visit.    Allergies   Allergies as of 08/24/2023 - Review Complete 08/24/2023  Allergen Reaction Noted   Other Swelling 10/08/2015   Ciprofloxacin Itching 11/28/2015   Latex Itching 01/01/2015   Lisinopril Swelling 03/24/2014   Penicillins Itching 04/23/2011   Protonix [pantoprazole sodium]  08/08/2015   Sulfa antibiotics  03/24/2014    Review of Systems   General: Negative for anorexia, weight loss, fever, chills, fatigue, weakness. ENT: Negative for hoarseness,   nasal congestion. See hpi CV: Negative for  chest pain, angina, palpitations, dyspnea on exertion, peripheral edema.  Respiratory: Negative for dyspnea at rest, dyspnea on exertion, cough, sputum, wheezing.  GI: See history of present illness. GU:  Negative for dysuria, hematuria, urinary incontinence, urinary frequency, nocturnal urination.  Endo: Negative for unusual weight change.     Physical Exam   BP 128/73 (BP Location: Right Arm, Patient Position: Sitting, Cuff Size: Large)   Pulse (!) 56   Temp 98.6 F (37 C) (Oral)   Ht 5\' 3"  (1.6 m)   Wt 237 lb (107.5 kg)   SpO2 95%   BMI 41.98 kg/m    General:  Well-nourished, well-developed in no acute distress.  Eyes: No icterus. Mouth: Oropharyngeal mucosa moist and pink  Abdomen: Bowel sounds are normal, nontender, nondistended, no hepatosplenomegaly or masses,  no abdominal bruits or hernia , no rebound or guarding.  Rectal: not performed  Extremities: No lower extremity edema. No clubbing or deformities. Neuro: Alert and oriented x 4   Skin: Warm and dry, no jaundice.   Psych: Alert and cooperative, normal mood and affect.  Labs   Lab Results  Component Value Date   NA 139 07/24/2022   CL 100 07/24/2022   K 3.5 07/24/2022   CO2 28 07/24/2022   BUN 16 07/24/2022   CREATININE 0.94 07/24/2022   GFRNONAA >60 07/24/2022   CALCIUM 9.1 07/24/2022   ALBUMIN 3.8 01/22/2017   GLUCOSE 96 07/24/2022   Lab Results  Component Value Date   WBC 5.2 07/24/2022   HGB 12.7 07/24/2022   HCT 40.8 07/24/2022   MCV 83.4 07/24/2022   PLT 269 07/24/2022    Imaging Studies   No results found.  Assessment/plan:   GERD/dysphagia: -off PPI for 3 weeks, using bay leaf tea to control symptoms. She wants to stay off medication -reinforced anti-reflux measures -discussed esophagram vs EGD to evaluation dysphagia. She has h/o web dilation in that remote past. BPE in 2019 ok. Advised for MBSS at one point but I don't see records indicating it was completed. Will start with BPE today, patient prefers noninvasive approach initially.    Leanna Battles. Melvyn Neth, MHS, PA-C [facility]

## 2023-08-30 ENCOUNTER — Ambulatory Visit (HOSPITAL_COMMUNITY)
Admission: RE | Admit: 2023-08-30 | Discharge: 2023-08-30 | Disposition: A | Discharge status: Home / Self Care | Payer: 59 | Source: Ambulatory Visit | Attending: Gastroenterology | Admitting: Gastroenterology

## 2023-08-30 DIAGNOSIS — R1310 Dysphagia, unspecified: Secondary | ICD-10-CM | POA: Diagnosis not present

## 2023-08-30 DIAGNOSIS — K219 Gastro-esophageal reflux disease without esophagitis: Secondary | ICD-10-CM | POA: Insufficient documentation

## 2023-08-30 DIAGNOSIS — K449 Diaphragmatic hernia without obstruction or gangrene: Secondary | ICD-10-CM | POA: Diagnosis not present

## 2023-10-15 DIAGNOSIS — I10 Essential (primary) hypertension: Secondary | ICD-10-CM | POA: Diagnosis not present

## 2023-10-15 DIAGNOSIS — E559 Vitamin D deficiency, unspecified: Secondary | ICD-10-CM | POA: Diagnosis not present

## 2023-10-15 DIAGNOSIS — E119 Type 2 diabetes mellitus without complications: Secondary | ICD-10-CM | POA: Diagnosis not present

## 2023-10-18 DIAGNOSIS — G4733 Obstructive sleep apnea (adult) (pediatric): Secondary | ICD-10-CM | POA: Diagnosis not present

## 2023-10-18 DIAGNOSIS — K219 Gastro-esophageal reflux disease without esophagitis: Secondary | ICD-10-CM | POA: Diagnosis not present

## 2023-10-18 DIAGNOSIS — J4550 Severe persistent asthma, uncomplicated: Secondary | ICD-10-CM | POA: Diagnosis not present

## 2023-10-18 DIAGNOSIS — M109 Gout, unspecified: Secondary | ICD-10-CM | POA: Diagnosis not present

## 2023-10-18 DIAGNOSIS — N611 Abscess of the breast and nipple: Secondary | ICD-10-CM | POA: Diagnosis not present

## 2023-10-18 DIAGNOSIS — N1831 Chronic kidney disease, stage 3a: Secondary | ICD-10-CM | POA: Diagnosis not present

## 2023-10-18 DIAGNOSIS — R21 Rash and other nonspecific skin eruption: Secondary | ICD-10-CM | POA: Diagnosis not present

## 2023-10-18 DIAGNOSIS — G3184 Mild cognitive impairment, so stated: Secondary | ICD-10-CM | POA: Diagnosis not present

## 2023-10-18 DIAGNOSIS — E782 Mixed hyperlipidemia: Secondary | ICD-10-CM | POA: Diagnosis not present

## 2023-10-18 DIAGNOSIS — R519 Headache, unspecified: Secondary | ICD-10-CM | POA: Diagnosis not present

## 2023-10-18 DIAGNOSIS — I10 Essential (primary) hypertension: Secondary | ICD-10-CM | POA: Diagnosis not present

## 2023-10-18 DIAGNOSIS — I25119 Atherosclerotic heart disease of native coronary artery with unspecified angina pectoris: Secondary | ICD-10-CM | POA: Diagnosis not present

## 2023-10-19 DIAGNOSIS — E119 Type 2 diabetes mellitus without complications: Secondary | ICD-10-CM | POA: Diagnosis not present

## 2023-10-26 DIAGNOSIS — Z7689 Persons encountering health services in other specified circumstances: Secondary | ICD-10-CM | POA: Diagnosis not present

## 2023-11-02 DIAGNOSIS — Z7689 Persons encountering health services in other specified circumstances: Secondary | ICD-10-CM | POA: Diagnosis not present

## 2023-11-04 ENCOUNTER — Emergency Department (HOSPITAL_COMMUNITY)
Admission: EM | Admit: 2023-11-04 | Discharge: 2023-11-05 | Disposition: A | Discharge status: Home / Self Care | Attending: Emergency Medicine | Admitting: Emergency Medicine

## 2023-11-04 ENCOUNTER — Other Ambulatory Visit: Payer: Self-pay

## 2023-11-04 ENCOUNTER — Encounter (HOSPITAL_COMMUNITY): Payer: Self-pay

## 2023-11-04 ENCOUNTER — Emergency Department (HOSPITAL_COMMUNITY)

## 2023-11-04 DIAGNOSIS — R1084 Generalized abdominal pain: Secondary | ICD-10-CM | POA: Insufficient documentation

## 2023-11-04 DIAGNOSIS — Z7982 Long term (current) use of aspirin: Secondary | ICD-10-CM | POA: Diagnosis not present

## 2023-11-04 DIAGNOSIS — I10 Essential (primary) hypertension: Secondary | ICD-10-CM | POA: Insufficient documentation

## 2023-11-04 DIAGNOSIS — R109 Unspecified abdominal pain: Secondary | ICD-10-CM | POA: Diagnosis not present

## 2023-11-04 DIAGNOSIS — Z91040 Latex allergy status: Secondary | ICD-10-CM | POA: Diagnosis not present

## 2023-11-04 DIAGNOSIS — R112 Nausea with vomiting, unspecified: Secondary | ICD-10-CM | POA: Diagnosis not present

## 2023-11-04 DIAGNOSIS — R197 Diarrhea, unspecified: Secondary | ICD-10-CM | POA: Diagnosis not present

## 2023-11-04 DIAGNOSIS — Z79899 Other long term (current) drug therapy: Secondary | ICD-10-CM | POA: Diagnosis not present

## 2023-11-04 DIAGNOSIS — J4540 Moderate persistent asthma, uncomplicated: Secondary | ICD-10-CM | POA: Insufficient documentation

## 2023-11-04 DIAGNOSIS — R519 Headache, unspecified: Secondary | ICD-10-CM | POA: Diagnosis not present

## 2023-11-04 DIAGNOSIS — R000 Tachycardia, unspecified: Secondary | ICD-10-CM | POA: Diagnosis not present

## 2023-11-04 LAB — CBC WITH DIFFERENTIAL/PLATELET
Abs Immature Granulocytes: 0.04 10*3/uL (ref 0.00–0.07)
Basophils Absolute: 0 10*3/uL (ref 0.0–0.1)
Basophils Relative: 0 %
Eosinophils Absolute: 0 10*3/uL (ref 0.0–0.5)
Eosinophils Relative: 0 %
HCT: 39.9 % (ref 36.0–46.0)
Hemoglobin: 12.9 g/dL (ref 12.0–15.0)
Immature Granulocytes: 0 %
Lymphocytes Relative: 3 %
Lymphs Abs: 0.3 10*3/uL — ABNORMAL LOW (ref 0.7–4.0)
MCH: 26.9 pg (ref 26.0–34.0)
MCHC: 32.3 g/dL (ref 30.0–36.0)
MCV: 83.1 fL (ref 80.0–100.0)
Monocytes Absolute: 0.3 10*3/uL (ref 0.1–1.0)
Monocytes Relative: 3 %
Neutro Abs: 9 10*3/uL — ABNORMAL HIGH (ref 1.7–7.7)
Neutrophils Relative %: 94 %
Platelets: 328 10*3/uL (ref 150–400)
RBC: 4.8 MIL/uL (ref 3.87–5.11)
RDW: 14.6 % (ref 11.5–15.5)
WBC: 9.6 10*3/uL (ref 4.0–10.5)
nRBC: 0 % (ref 0.0–0.2)

## 2023-11-04 LAB — COMPREHENSIVE METABOLIC PANEL WITH GFR
ALT: 12 U/L (ref 0–44)
AST: 19 U/L (ref 15–41)
Albumin: 3.9 g/dL (ref 3.5–5.0)
Alkaline Phosphatase: 98 U/L (ref 38–126)
Anion gap: 13 (ref 5–15)
BUN: 26 mg/dL — ABNORMAL HIGH (ref 8–23)
CO2: 23 mmol/L (ref 22–32)
Calcium: 10.1 mg/dL (ref 8.9–10.3)
Chloride: 100 mmol/L (ref 98–111)
Creatinine, Ser: 1.19 mg/dL — ABNORMAL HIGH (ref 0.44–1.00)
GFR, Estimated: 48 mL/min — ABNORMAL LOW (ref >60)
Glucose, Bld: 114 mg/dL — ABNORMAL HIGH (ref 70–99)
Potassium: 4 mmol/L (ref 3.5–5.1)
Sodium: 136 mmol/L (ref 135–145)
Total Bilirubin: 0.7 mg/dL (ref 0.0–1.2)
Total Protein: 8.2 g/dL — ABNORMAL HIGH (ref 6.5–8.1)

## 2023-11-04 LAB — URINALYSIS, ROUTINE W REFLEX MICROSCOPIC
Bacteria, UA: NONE SEEN
Bilirubin Urine: NEGATIVE
Glucose, UA: NEGATIVE mg/dL
Ketones, ur: 5 mg/dL — AB
Leukocytes,Ua: NEGATIVE
Nitrite: NEGATIVE
Protein, ur: 100 mg/dL — AB
Specific Gravity, Urine: 1.018 (ref 1.005–1.030)
pH: 6 (ref 5.0–8.0)

## 2023-11-04 LAB — LIPASE, BLOOD: Lipase: 25 U/L (ref 11–51)

## 2023-11-04 MED ORDER — LACTATED RINGERS IV BOLUS
1000.0000 mL | Freq: Once | INTRAVENOUS | Status: AC
  Administered 2023-11-04: 1000 mL via INTRAVENOUS

## 2023-11-04 MED ORDER — ONDANSETRON 4 MG PO TBDP: 4.0000 mg | ORAL_TABLET | Freq: Three times a day (TID) | ORAL | 0 refills | Status: AC | PRN

## 2023-11-04 MED ORDER — METOCLOPRAMIDE HCL 5 MG/ML IJ SOLN
10.0000 mg | Freq: Once | INTRAMUSCULAR | Status: AC
  Administered 2023-11-04: 10 mg via INTRAVENOUS
  Filled 2023-11-04: qty 2

## 2023-11-04 MED ORDER — ALUM & MAG HYDROXIDE-SIMETH 200-200-20 MG/5ML PO SUSP
30.0000 mL | Freq: Once | ORAL | Status: AC
  Administered 2023-11-04: 30 mL via ORAL
  Filled 2023-11-04: qty 30

## 2023-11-04 MED ORDER — IOHEXOL 300 MG/ML  SOLN
100.0000 mL | Freq: Once | INTRAMUSCULAR | Status: AC | PRN
  Administered 2023-11-04: 100 mL via INTRAVENOUS

## 2023-11-04 MED ORDER — ONDANSETRON HCL 4 MG/2ML IJ SOLN
4.0000 mg | Freq: Once | INTRAMUSCULAR | Status: AC
  Administered 2023-11-04: 4 mg via INTRAVENOUS
  Filled 2023-11-04: qty 2

## 2023-11-04 MED ORDER — LIDOCAINE VISCOUS HCL 2 % MT SOLN
15.0000 mL | Freq: Once | OROMUCOSAL | Status: AC
  Administered 2023-11-04: 15 mL via ORAL
  Filled 2023-11-04: qty 15

## 2023-11-04 NOTE — Discharge Instructions (Signed)
 You were evaluated in the Emergency Department and after careful evaluation, we did not find any emergent condition requiring admission or further testing in the hospital.  Your exam/testing today is overall reassuring.  Symptoms may be related to side effect of the Ozempic.  Discuss your symptoms with your regular doctor, your dosage may need to be adjusted.  Can use the Zofran  at home as needed for nausea.  Please return to the Emergency Department if you experience any worsening of your condition.   Thank you for allowing us  to be a part of your care.

## 2023-11-04 NOTE — ED Notes (Signed)
 Assisted patient to the bathroom. Patient very weak, gait unsteady, needs assistance x1 with ambulation, usually independent. Patient continues nauseated, waiting IV placement for antiemetic administration.

## 2023-11-04 NOTE — ED Notes (Signed)
 Zero noted bowel sounds x 2 RN, results reported to MD

## 2023-11-04 NOTE — ED Provider Notes (Signed)
  Provider Note MRN:  [id number]  Arrival date & time: 11/04/23    ED Course and Medical Decision Making  Assumed care of patient at sign-out or upon transfer.  Nausea vomiting abdominal pain with reassuring evaluation, CT scan without emergent findings.  Favoring Ozempic side effects, plan is for reassessment after further fluids, symptomatic medications.  11:45 PM update: Patient feeling a lot better, well-appearing on my assessment with reassuring vital signs, soft abdomen, reassuring workup, appropriate for discharge.  Procedures  Final Clinical Impressions(s) / ED Diagnoses     ICD-10-CM   1. Nausea and vomiting, unspecified vomiting type  R11.2       ED Discharge Orders          Ordered    ondansetron  (ZOFRAN -ODT) 4 MG disintegrating tablet  Every 8 hours PRN        11/04/23 2357              Discharge Instructions      You were evaluated in the Emergency Department and after careful evaluation, we did not find any emergent condition requiring admission or further testing in the hospital.  Your exam/testing today is overall reassuring.  Symptoms may be related to side effect of the Ozempic.  Discuss your symptoms with your regular doctor, your dosage may need to be adjusted.  Can use the Zofran  at home as needed for nausea.  Please return to the Emergency Department if you experience any worsening of your condition.   Thank you for allowing us  to be a part of your care.      Merrick Abe. Harless Lien, MD [facility] [email or web address]    Edson Graces, MD 11/04/23 [phone]

## 2023-11-04 NOTE — ED Triage Notes (Signed)
 Pt arrived via [facility] from home c/o N/V/D and abdominal cramping that began earlier today. Pt endorses fatigue and a headache at this time. Pt reports recently starting Ozempic and has had her 3rd dose of this new medication this past Tuesday. [facility] report CBG 115. Pt reports she has been unable to take her medications today due to the emesis.

## 2023-11-04 NOTE — ED Provider Notes (Signed)
 [place] EMERGENCY DEPARTMENT AT [facility] Provider Note   CSN: [id number] Arrival date & time: 11/04/23  1841     History {Add pertinent medical, surgical, social history, OB history to HPI:1} Chief Complaint  Patient presents with   Emesis    Sandra [name] is a 73 y.o. female with PMH as listed below who presents with via [facility] from home c/o N/V/D and abdominal cramping that began earlier today. Pt endorses fatigue and a headache at this time. Pt reports recently starting Ozempic and has had her 3rd dose of this new medication this past Tuesday. [facility] report CBG 115. Pt reports she has been unable to take her medications today due to the emesis. .    Past Medical History:  Diagnosis Date   Acid reflux 2000   HA with nexium     Anemia    Angio-edema    caused by Lisinopril    Arthritis    Chest pain, unspecified 03/25/2014   MI ruled out. CTA negative PE   Diabetes mellitus 2012   borderline diabetes, never on medicine    Enteritis 04/23/2011   Hypercholesteremia 2011   Hypertension 1990s    Ileitis 03/24/2014   Moderate persistent asthma in adult without complication 11/01/2015   Shortness of breath dyspnea    Sleep apnea        Home Medications Prior to Admission medications   Medication Sig Start Date End Date Taking? Authorizing Provider  ACCU-CHEK AVIVA PLUS test strip USE AS DIRECTED ONCE DAILY 08/30/20   Wendelyn Halter, MD  albuterol  (PROVENTIL  HFA;VENTOLIN  HFA) 108 (90 BASE) MCG/ACT inhaler Inhale 1-2 puffs into the lungs every 6 (six) hours as needed for wheezing or shortness of breath. 05/03/15   Eino Gravel, MD  aspirin EC 81 MG tablet Take 81 mg by mouth daily.    [provider]  atorvastatin  (LIPITOR) 10 MG tablet Take 1 tablet (10 mg total) by mouth daily. 12/04/14   Funches, Josalyn, MD  Cholecalciferol (VITAMIN D-3) 125 MCG (5000 UT) TABS Take 5,000 Int'l Units by mouth daily.    [provider]  diltiazem (CARDIZEM CD)  180 MG 24 hr capsule Take 180 mg by mouth every morning. 06/04/22   [provider]  hydrochlorothiazide  (HYDRODIURIL ) 25 MG tablet Take 1 tablet (25 mg total) by mouth daily. 11/22/14   Funches, Josalyn, MD  metoprolol  succinate (TOPROL -XL) 50 MG 24 hr tablet Take 50 mg by mouth daily. 11/04/17   [provider]  olopatadine (PATADAY) 0.1 % ophthalmic solution Place 1 drop into both eyes daily as needed (Itching).    [provider]  omeprazole  (PRILOSEC) 20 MG capsule TAKE 1 CAPSULE BY MOUTH TWICE DAILY 30 MINUTES PRIOR TO A MEAL. WILL NEED APPOINTMENT FOR FURTHER REFILLS. 03/17/23   Vinetta Greening, DO  oxybutynin  (DITROPAN -XL) 5 MG 24 hr tablet TAKE (1) TABLET BY MOUTH AT BEDTIME. 04/04/23   Wendelyn Halter, MD  Polyethyl Glycol-Propyl Glycol (SYSTANE ULTRA) 0.4-0.3 % SOLN Place 1 drop into both eyes daily as needed (Irritation and itching).    [provider]      Allergies    Other, Ciprofloxacin , Latex, Lisinopril , Penicillins, Protonix  [pantoprazole  sodium], and Sulfa  antibiotics    Review of Systems   Review of Systems A 10 point review of systems was performed and is negative unless otherwise reported in HPI.  Physical Exam Updated Vital Signs BP (!) 174/86   Pulse (!) 101   Temp 98.7 F (37.1 C) (  Oral)   Resp 18   Ht 5\' 3"  (1.6 m)   Wt 102.1 kg   SpO2 93%   BMI 39.86 kg/m  Physical Exam General: Normal appearing {Desc; female/female:11659}, lying in bed.  HEENT: PERRLA, Sclera anicteric, MMM, trachea midline.  Cardiology: RRR, no murmurs/rubs/gallops. BL radial and DP pulses equal bilaterally.  Resp: Normal respiratory rate and effort. CTAB, no wheezes, rhonchi, crackles.  Abd: Soft, non-tender, non-distended. No rebound tenderness or guarding.  GU: Deferred. MSK: No peripheral edema or signs of trauma. Extremities without deformity or TTP. No cyanosis or clubbing. Skin: warm, dry. No rashes or lesions. Back: No CVA tenderness Neuro:  A&Ox4, CNs II-XII grossly intact. MAEs. Sensation grossly intact.  Psych: Normal mood and affect.   ED Results / Procedures / Treatments   Labs (all labs ordered are listed, but only abnormal results are displayed) Labs Reviewed  COMPREHENSIVE METABOLIC PANEL WITH GFR  CBC WITH DIFFERENTIAL/PLATELET  URINALYSIS, ROUTINE W REFLEX MICROSCOPIC  LIPASE, BLOOD    EKG None  Radiology No results found.  Procedures Procedures  {Document cardiac monitor, telemetry assessment procedure when appropriate:1}  Medications Ordered in ED Medications - No data to display  ED Course/ Medical Decision Making/ A&P                          Medical Decision Making Amount and/or Complexity of Data Reviewed Labs: ordered.    This patient presents to the ED for concern of ***, this involves an extensive number of treatment options, and is a complaint that carries with it a high risk of complications and morbidity.  I considered the following differential and admission for this acute, potentially life threatening condition.   MDM:    ***     Labs: I Ordered, and personally interpreted labs.  The pertinent results include:  ***  Imaging Studies ordered: I ordered imaging studies including *** I independently visualized and interpreted imaging. I agree with the radiologist interpretation  Additional history obtained from ***.  External records from outside source obtained and reviewed including ***  Cardiac Monitoring: The patient was maintained on a cardiac monitor.  I personally viewed and interpreted the cardiac monitored which showed an underlying rhythm of: ***  Reevaluation: After the interventions noted above, I reevaluated the patient and found that they have :{resolved/improved/worsened:23923::"improved"}  Social Determinants of Health: ***  Disposition:  ***  Co morbidities that complicate the patient evaluation  Past Medical History:  Diagnosis Date   Acid reflux  2000   HA with nexium     Anemia    Angio-edema    caused by Lisinopril    Arthritis    Chest pain, unspecified 03/25/2014   MI ruled out. CTA negative PE   Diabetes mellitus 2012   borderline diabetes, never on medicine    Enteritis 04/23/2011   Hypercholesteremia 2011   Hypertension 1990s    Ileitis 03/24/2014   Moderate persistent asthma in adult without complication 11/01/2015   Shortness of breath dyspnea    Sleep apnea      Medicines No orders of the defined types were placed in this encounter.   I have reviewed the patients home medicines and have made adjustments as needed  Problem List / ED Course: Problem List Items Addressed This Visit   None        {Document critical care time when appropriate:1} {Document review of labs and clinical decision tools ie heart score, Chads2Vasc2 etc:1}  {Document your  independent review of radiology images, and any outside records:1} {Document your discussion with family members, caretakers, and with consultants:1} {Document social determinants of health affecting pt's care:1} {Document your decision making why or why not admission, treatments were needed:1}  This note was created using dictation software, which may contain spelling or grammatical errors.

## 2023-11-19 ENCOUNTER — Other Ambulatory Visit (HOSPITAL_COMMUNITY): Payer: Self-pay | Admitting: Internal Medicine

## 2023-11-19 DIAGNOSIS — Z1231 Encounter for screening mammogram for malignant neoplasm of breast: Secondary | ICD-10-CM

## 2023-11-24 ENCOUNTER — Ambulatory Visit (HOSPITAL_COMMUNITY)

## 2024-01-19 ENCOUNTER — Ambulatory Visit (HOSPITAL_COMMUNITY)
Admission: RE | Admit: 2024-01-19 | Discharge: 2024-01-19 | Disposition: A | Discharge status: Home / Self Care | Source: Ambulatory Visit | Attending: Internal Medicine | Admitting: Internal Medicine

## 2024-01-19 DIAGNOSIS — Z1231 Encounter for screening mammogram for malignant neoplasm of breast: Secondary | ICD-10-CM | POA: Insufficient documentation

## 2024-01-28 ENCOUNTER — Telehealth: Payer: Self-pay

## 2024-01-28 NOTE — Telephone Encounter (Signed)
 Oversupply of atorvastatin , will wait until she finishes this up to fill for the first time this year. No longer on Ozempic due to side effects. Will check back in with her after updated labs in August

## 2024-02-17 DIAGNOSIS — E1122 Type 2 diabetes mellitus with diabetic chronic kidney disease: Secondary | ICD-10-CM | POA: Diagnosis not present

## 2024-02-17 DIAGNOSIS — M109 Gout, unspecified: Secondary | ICD-10-CM | POA: Diagnosis not present

## 2024-02-17 DIAGNOSIS — N1831 Chronic kidney disease, stage 3a: Secondary | ICD-10-CM | POA: Diagnosis not present

## 2024-02-24 DIAGNOSIS — N1831 Chronic kidney disease, stage 3a: Secondary | ICD-10-CM | POA: Diagnosis not present

## 2024-02-24 DIAGNOSIS — I10 Essential (primary) hypertension: Secondary | ICD-10-CM | POA: Diagnosis not present

## 2024-02-24 DIAGNOSIS — M109 Gout, unspecified: Secondary | ICD-10-CM | POA: Diagnosis not present

## 2024-02-24 DIAGNOSIS — J4550 Severe persistent asthma, uncomplicated: Secondary | ICD-10-CM | POA: Diagnosis not present

## 2024-02-24 DIAGNOSIS — G3184 Mild cognitive impairment, so stated: Secondary | ICD-10-CM | POA: Diagnosis not present

## 2024-02-24 DIAGNOSIS — R519 Headache, unspecified: Secondary | ICD-10-CM | POA: Diagnosis not present

## 2024-02-24 DIAGNOSIS — E1122 Type 2 diabetes mellitus with diabetic chronic kidney disease: Secondary | ICD-10-CM | POA: Diagnosis not present

## 2024-02-24 DIAGNOSIS — K219 Gastro-esophageal reflux disease without esophagitis: Secondary | ICD-10-CM | POA: Diagnosis not present

## 2024-02-24 DIAGNOSIS — I25119 Atherosclerotic heart disease of native coronary artery with unspecified angina pectoris: Secondary | ICD-10-CM | POA: Diagnosis not present

## 2024-02-24 DIAGNOSIS — E782 Mixed hyperlipidemia: Secondary | ICD-10-CM | POA: Diagnosis not present

## 2024-02-24 DIAGNOSIS — G4733 Obstructive sleep apnea (adult) (pediatric): Secondary | ICD-10-CM | POA: Diagnosis not present

## 2024-07-20 ENCOUNTER — Telehealth: Payer: Self-pay

## 2024-07-20 DIAGNOSIS — I10 Essential (primary) hypertension: Secondary | ICD-10-CM

## 2024-07-24 ENCOUNTER — Other Ambulatory Visit: Payer: Self-pay | Admitting: Internal Medicine

## 2024-07-24 ENCOUNTER — Other Ambulatory Visit: Payer: Self-pay | Admitting: Obstetrics & Gynecology

## 2024-07-24 ENCOUNTER — Other Ambulatory Visit: Payer: Self-pay

## 2024-07-24 DIAGNOSIS — K219 Gastro-esophageal reflux disease without esophagitis: Secondary | ICD-10-CM

## 2024-07-24 NOTE — Patient Outreach (Signed)
 Complex Care Management   Visit Note  07/24/2024  Name:  Sandra [name] MRN: [id number] DOB: 19-Mar-1951  Situation: Referral received for Complex Care Management related to HTN and prediabetes/blood sugar management. I obtained verbal consent from Patient.  Visit completed with Patient  on the phone  Background:   Past Medical History:  Diagnosis Date   Acid reflux 2000   HA with nexium     Anemia    Angio-edema    caused by Lisinopril    Arthritis    Chest pain, unspecified 03/25/2014   MI ruled out. CTA negative PE   Diabetes mellitus 2012   borderline diabetes, never on medicine    Enteritis 04/23/2011   Hypercholesteremia 2011   Hypertension 1990s    Ileitis 03/24/2014   Moderate persistent asthma in adult without complication 11/01/2015   Shortness of breath dyspnea    Sleep apnea     Assessment: Patient Reported Symptoms:  Cognitive Cognitive Status: Alert and oriented to person, place, and time, Struggling with memory recall, Normal speech and language skills Cognitive/Intellectual Conditions Management [RPT]: None reported or documented in medical history or problem list   Health Maintenance Behaviors: Annual physical exam, Sleep adequate, Healthy diet, Stress management Healing Pattern: Unsure Health Facilitated by: Healthy diet, Stress management, Rest, Prayer/meditation  Neurological Neurological Review of Symptoms: Headaches (Patient reports intermittent headaches.None reported at time of call.) Neurological Management Strategies: Medication therapy, Routine screening Neurological Self-Management Outcome: 3 (uncertain)  HEENT HEENT Symptoms Reported: No symptoms reported, Eye dryness (Uses eye drops) HEENT Management Strategies: Routine screening, Medication therapy HEENT Self-Management Outcome: 4 (good)    Cardiovascular Cardiovascular Symptoms Reported: No symptoms reported Does patient have uncontrolled Hypertension?: Yes Is patient checking Blood  Pressure at home?: Yes Patient's Recent BP reading at home: Patient reports checking BP daily- 134/80 today. States she has not taken her BP meds yet. Patient encouraged to take meds at time of call. Cardiovascular Management Strategies: Routine screening, Medication therapy, Diet modification Weight: 219 lb (99.3 kg) Cardiovascular Self-Management Outcome: 3 (uncertain)  Respiratory Respiratory Symptoms Reported: Dry cough Other Respiratory Symptoms: States she has sleep apnea. Patient states she does not use her CPAP machine because she is scared of it Uses inhaler as needed for wheezing/SOB. Additional Respiratory Details: Becomes SOB with exertion at times. Respiratory Management Strategies: Routine screening, Medication therapy Respiratory Self-Management Outcome: 4 (good)  Endocrine Endocrine Symptoms Reported: No symptoms reported Is patient diabetic?: Yes Is patient checking blood sugars at home?: Yes List most recent blood sugar readings, include date and time of day: Patient reports taking BS daily- BS today was 107 Endocrine Self-Management Outcome: 4 (good)  Gastrointestinal Gastrointestinal Symptoms Reported: No symptoms reported Additional Gastrointestinal Details: Patient reports normal BM's daily Gastrointestinal Management Strategies: Adequate rest, Diet modification Gastrointestinal Self-Management Outcome: 4 (good)    Genitourinary Genitourinary Symptoms Reported: Frequency Additional Genitourinary Details: States she has to get up 2 times in the night to go to the bathroom. Genitourinary Management Strategies: Adequate rest  Integumentary Integumentary Symptoms Reported: No symptoms reported Skin Management Strategies: Routine screening Skin Self-Management Outcome: 4 (good)  Musculoskeletal Musculoskelatal Symptoms Reviewed: Back pain Additional Musculoskeletal Details: Arthritis pain in lower back. Musculoskeletal Management Strategies: Routine  screening Musculoskeletal Self-Management Outcome: 4 (good) Falls in the past year?: No Number of falls in past year: 1 or less Was there an injury with Fall?: No Fall Risk Category Calculator: 0 Patient Fall Risk Level: Low Fall Risk    Psychosocial Psychosocial Symptoms Reported: No symptoms reported  Behavioral Management Strategies: Adequate rest, Coping strategies Behavioral Health Self-Management Outcome: 4 (good)   Quality of Family Relationships: supportive, helpful Do you feel physically threatened by others?: No    07/24/2024    PHQ2-9 Depression Screening   Little interest or pleasure in doing things    Feeling down, depressed, or hopeless Not at all  PHQ-2 - Total Score 0  Trouble falling or staying asleep, or sleeping too much    Feeling tired or having little energy    Poor appetite or overeating     Feeling bad about yourself - or that you are a failure or have let yourself or your family down    Trouble concentrating on things, such as reading the newspaper or watching television    Moving or speaking so slowly that other people could have noticed.  Or the opposite - being so fidgety or restless that you have been moving around a lot more than usual    Thoughts that you would be better off dead, or hurting yourself in some way    PHQ2-9 Total Score    If you checked off any problems, how difficult have these problems made it for you to do your work, take care of things at home, or get along with other people    Depression Interventions/Treatment      Today's Vitals   07/24/24 1347  BP: 134/80  Weight: 219 lb (99.3 kg)   Pain Scale: 0-10 Pain Score: 0-No pain  Medications Reviewed Today     Reviewed by Leodis Warren DEL, RN (Registered Nurse) on 07/24/24 at 1327  Med List Status: <None>   Medication Order Taking? Sig Documenting Provider Last Dose Status Informant  ACCU-CHEK AVIVA PLUS test strip [id number] Yes USE AS DIRECTED ONCE DAILY Jayne Vonn DEL, MD   Active Self  albuterol  (PROVENTIL  HFA;VENTOLIN  HFA) 108 (90 BASE) MCG/ACT inhaler [id number] Yes Inhale 1-2 puffs into the lungs every 6 (six) hours as needed for wheezing or shortness of breath. Lynwood Anes, MD  Active Self  aspirin EC 81 MG tablet [id number] Yes Take 81 mg by mouth daily. [provider]  Active Self  atorvastatin  (LIPITOR) 10 MG tablet [id number] Yes Take 1 tablet (10 mg total) by mouth daily. Funches, Josalyn, MD  Active Self  Cholecalciferol (VITAMIN D-3) 125 MCG (5000 UT) TABS [id number] Yes Take 5,000 Int'l Units by mouth daily. [provider]  Active Self  diltiazem (CARDIZEM CD) 180 MG 24 hr capsule [id number] Yes Take 180 mg by mouth every morning. [provider]  Active Self  hydrochlorothiazide  (HYDRODIURIL ) 25 MG tablet [id number] Yes Take 1 tablet (25 mg total) by mouth daily. Funches, Josalyn, MD  Active Self  metoprolol  succinate (TOPROL -XL) 50 MG 24 hr tablet [id number] Yes Take 50 mg by mouth daily. [provider]  Active Self  olopatadine (PATADAY) 0.1 % ophthalmic solution [id number] Yes Place 1 drop into both eyes daily as needed (Itching). [provider]  Active Self  omeprazole  (PRILOSEC) 20 MG capsule [id number]  TAKE 1 CAPSULE BY MOUTH TWICE DAILY 30 MINUTES PRIOR TO A MEAL. WILL NEED APPOINTMENT FOR FURTHER REFILLS.  Patient not taking: Reported on 07/24/2024   Cindie Carlin POUR, DO  Active   ondansetron  (ZOFRAN -ODT) 4 MG disintegrating tablet [id number] Yes Take 1 tablet (4 mg total) by mouth every 8 (eight) hours as needed for nausea or vomiting. Theadore Ozell HERO, MD  Active   oxybutynin  (DITROPAN -XL) 5 MG 24 hr tablet [id number]  TAKE (1)  TABLET BY MOUTH AT BEDTIME.  Patient not taking: Reported on 07/24/2024   Jayne Vonn DEL, MD  Active   Polyethyl Glycol-Propyl Glycol (SYSTANE ULTRA) 0.4-0.3 % SOLN [id number] Yes Place 1 drop into both eyes daily as needed (Irritation and itching). [provider]  Active Self             Recommendation:   Continue Current Plan of Care Advised to contact pharmacy for medication refills.   Follow Up Plan:   Telephone follow up appointment date/time:  08/07/24 at 11 am  Warren Quivers RN CM Population Health-Complex Care Management [facility] [phone]

## 2024-07-24 NOTE — Telephone Encounter (Signed)
 Seen Sandra [name] last

## 2024-07-24 NOTE — Patient Instructions (Signed)
 Visit Information  Thank you for taking time to visit with me today. Please don't hesitate to contact me if I can be of assistance to you before our next scheduled appointment.  Our next appointment is by telephone on 08/07/24 at 11 am. Please call the care guide team at [phone] if you need to cancel or reschedule your appointment.   Following is a copy of your care plan:   Goals Addressed             This Visit's Progress    [facility] RN Care -HTN       Problems:  Chronic Disease Management support and education needs related to HTN Knowledge Deficits related to HTN  Goal: Over the next 90 days the Patient will attend all scheduled medical appointments: with providers pertaining to health maintenance as evidenced by patient report and/or chart review.         continue to work with Medical Illustrator and/or Social Worker to address care management and care coordination needs related to HTN as evidenced by adherence to care management team scheduled appointments     demonstrate Improved adherence to prescribed treatment plan for HTN as evidenced by patient report that she is taking her blood pressure medications as prescribed and on time.  demonstrate Improved health management independence as evidenced by checking BP daily, keeping a log, taking all medications as prescribed and reporting any elevated readings to provider.         take all medications exactly as prescribed and will call provider for medication related questions as evidenced by patient report and/or chart review.     verbalize basic understanding of HTN disease process and self health management plan as evidenced by taking all medications as prescribed, following a low sodium diet, checking BP daily and recording results and notifying provider for any concerns. verbalize understanding of plan for management of HTN as evidenced by patient taking BP meds as prescribed, keeping a log of BP readings, understanding the importance of  each medicationand patient reporting BP readings remaining within normal range.   Interventions:   Hypertension Interventions: Last practice recorded BP readings:  BP Readings from Last 3 Encounters:  07/24/24 134/80  11/04/23 (!) 176/76  08/24/23 128/73   Most recent eGFR/CrCl: No results found for: EGFR  No components found for: CRCL  Evaluation of current treatment plan related to hypertension self management and patient's adherence to plan as established by provider Provided education to patient re: stroke prevention, s/s of heart attack and stroke Reviewed medications with patient and discussed importance of compliance Discussed plans with patient for ongoing care management follow up and provided patient with direct contact information for care management team Advised patient, providing education and rationale, to monitor blood pressure daily and record, calling PCP for findings outside established parameters Discussed complications of poorly controlled blood pressure such as heart disease, stroke, circulatory complications, vision complications, kidney impairment, sexual dysfunction Screening for signs and symptoms of depression related to chronic disease state  Assessed social determinant of health barriers  Patient Self-Care Activities:  Attend all scheduled provider appointments Attend church or other social activities Call pharmacy for medication refills 3-7 days in advance of running out of medications Perform all self care activities independently  Perform IADL's (shopping, preparing meals, housekeeping, managing finances) independently Take medications as prescribed   check blood pressure daily write blood pressure results in a log or diary learn about high blood pressure keep a blood pressure log take blood pressure  log to all doctor appointments call doctor for signs and symptoms of high blood pressure keep all doctor appointments take medications for blood  pressure exactly as prescribed report new symptoms to your doctor eat more whole grains, fruits and vegetables, lean meats and healthy fats limit salt intake to 1.500 mg/day  Plan:  Telephone follow up appointment with care management team member scheduled for:  08/07/24 at 11 am.          [facility] RN Care Plan- Prediabetes/BS management       Problems:  Chronic Disease Management support and education needs related to prediabetes/elevated BS readings. Knowledge Deficits related to DMII  Goal: Over the next 90 days the Patient will attend all scheduled medical appointments: with providers pertaining to health maintenance as evidenced by patient report and/or chart review.         continue to work with Medical Illustrator and/or Social Worker to address care management and care coordination needs related to DMII as evidenced by adherence to care management team scheduled appointments     demonstrate Improved health management independence as evidenced by checking BS daily and keeping a log.        take all medications exactly as prescribed and will call provider for medication related questions as evidenced by patient report.    verbalize basic understanding of DMII disease process and self health management plan as evidenced by checking BS daily and keeping a log, monitoring carb intake, and reporting any abnormal readings to provider.  verbalize understanding of plan for management of DMII as evidenced by patient monitoring BS levels, eating a low carb diet and maintaining BS readings within a normal range.   Interventions:   Diabetes Interventions: Assessed patient's understanding of A1c goal: 6.0 Provided education to patient about basic DM disease process Counseled on importance of regular laboratory monitoring as prescribed Discussed plans with patient for ongoing care management follow up and provided patient with direct contact information for care management team Provided patient with  written educational materials related to hypo and hyperglycemia and importance of correct treatment Screening for signs and symptoms of depression related to chronic disease state  Assessed social determinant of health barriers Lab Results  Component Value Date   HGBA1C 6.1 (H) 01/22/2017    Patient Self-Care Activities:  Attend all scheduled provider appointments Attend church or other social activities Call pharmacy for medication refills 3-7 days in advance of running out of medications Perform all self care activities independently  Perform IADL's (shopping, preparing meals, housekeeping, managing finances) independently schedule appointment with eye doctor check feet daily for cuts, sores or redness set goal weight trim toenails straight across drink 6 to 8 glasses of water  each day eat fish at least once per week fill half of plate with vegetables keep a food diary limit fast food meals to no more than 1 per week manage portion size prepare main meal at home 3 to 5 days each week set a realistic goal switch to low-fat or skim milk switch to sugar-free drinks do heel pump exercise 2 to 3 times each day  Plan:  Telephone follow up appointment with care management team member scheduled for:  08/07/24 at 11 am.             Please call the Suicide and Crisis Lifeline: 988 call the [place]  National Suicide Prevention Lifeline: 734-673-5348 or TTY: 704-411-8097 TTY 808-538-9059) to talk to a trained counselor call 1-800-273-TALK (toll free, 24 hour hotline) if you are  experiencing a Mental Health or Behavioral Health Crisis or need someone to talk to.  Patient verbalized understanding of Care plan and visit instructions communicated this visit  Warren Quivers RN CM Population Health-Complex Care Management [facility] [phone]

## 2024-07-31 ENCOUNTER — Other Ambulatory Visit: Payer: Self-pay

## 2024-07-31 NOTE — Patient Instructions (Signed)
 Visit Information  Thank you for taking time to visit with me today. Please don't hesitate to contact me if I can be of assistance to you before our next scheduled appointment.    Following is a copy of your care plan:   Goals Addressed             This Visit's Progress    COMPLETED: BSW Goals       Current SDOH Barriers:  Meal preparation  Interventions: Patient interviewed and appropriate screenings performed Provided patient with information about Meals on Wheels and Home Health services Advised patient to follow up with [facility] Dual CM Navigator for more options. SW educated patient on Meals on Wheels and patient reports she is not home bound.  SW educated patient on Home Health services to assist with meal preparation and other needs.  Patient declines Home Health.  SW offered to contact insurance for other options but patient declined. Closed           Please call 911 if you are experiencing a Mental Health or Behavioral Health Crisis or need someone to talk to.  Patient verbalized understanding of Care plan and visit instructions communicated this visit  Tillman Gardener, BSW [place]  [facility], [facility] Social Worker Direct Dial: [phone]  Fax: [phone] Website: [email or web address]

## 2024-07-31 NOTE — Patient Outreach (Signed)
 Social Drivers of Health  Community Resource and Care Coordination Visit Note   07/31/2024  Name: Sandra [name] MRN: [id number] DOB:07/02/1951  Situation: Referral received for [facility] needs assessment and assistance related to Meal Preparation. I obtained verbal consent from Patient.  Visit completed with Patient on the phone.   Background:   SDOH Interventions Today    Flowsheet Row Most Recent Value  SDOH Interventions   Food Insecurity Interventions --  [Patient is able to purchase food but gets light headed sometimes when she stands to cook.  Patient would like meals prepared a few days a week.  Patient is not home bound.  Pt not interested in calling insurance or Home Health referral.]  Financial Strain Interventions Intervention Not Indicated     Assessment:   Goals Addressed             This Visit's Progress    COMPLETED: BSW Goals       Current SDOH Barriers:  Meal preparation  Interventions: Patient interviewed and appropriate screenings performed Provided patient with information about Meals on Wheels and Home Health services Advised patient to follow up with [facility] Dual CM Navigator for more options. SW educated patient on Meals on Wheels and patient reports she is not home bound.  SW educated patient on Home Health services to assist with meal preparation and other needs.  Patient declines Home Health.  SW offered to contact insurance for other options but patient declined. Closed           Recommendation:   SW recommends patient follow up with insurance for other options.  Follow Up Plan:   Patient declines further calls or assistance. [employer] will be closed. Patient has been provided contact information should new needs arise.   Tillman Gardener, BSW [place]  [facility], [facility] Social Worker Direct Dial: [phone]  Fax: [phone] Website: [email or web address]

## 2024-08-07 ENCOUNTER — Other Ambulatory Visit: Payer: Self-pay

## 2024-09-04 ENCOUNTER — Telehealth
